# Patient Record
Sex: Male | Born: 1945 | Race: Black or African American | Hispanic: No | Marital: Married | Smoking: Former smoker
Health system: Southern US, Community
[De-identification: ages and names within clinical notes are randomized; demographics above are authoritative.]

## PROBLEM LIST (undated history)

## (undated) DIAGNOSIS — E119 Type 2 diabetes mellitus without complications: Secondary | ICD-10-CM

## (undated) DIAGNOSIS — M1990 Unspecified osteoarthritis, unspecified site: Secondary | ICD-10-CM

## (undated) DIAGNOSIS — G4730 Sleep apnea, unspecified: Secondary | ICD-10-CM

## (undated) DIAGNOSIS — Z85118 Personal history of other malignant neoplasm of bronchus and lung: Secondary | ICD-10-CM

## (undated) DIAGNOSIS — R911 Solitary pulmonary nodule: Secondary | ICD-10-CM

## (undated) DIAGNOSIS — E785 Hyperlipidemia, unspecified: Secondary | ICD-10-CM

## (undated) DIAGNOSIS — J449 Chronic obstructive pulmonary disease, unspecified: Secondary | ICD-10-CM

## (undated) DIAGNOSIS — C801 Malignant (primary) neoplasm, unspecified: Secondary | ICD-10-CM

## (undated) DIAGNOSIS — I10 Essential (primary) hypertension: Secondary | ICD-10-CM

## (undated) DIAGNOSIS — N400 Enlarged prostate without lower urinary tract symptoms: Secondary | ICD-10-CM

## (undated) DIAGNOSIS — R3915 Urgency of urination: Secondary | ICD-10-CM

## (undated) DIAGNOSIS — R350 Frequency of micturition: Secondary | ICD-10-CM

## (undated) HISTORY — DX: Essential (primary) hypertension: I10

## (undated) HISTORY — PX: VIDEO ASSISTED THORACOSCOPY (VATS)/ LOBECTOMY: SHX6169

## (undated) HISTORY — PX: CARPAL TUNNEL RELEASE: SHX101

## (undated) HISTORY — PX: COLONOSCOPY: SHX174

## (undated) HISTORY — PX: POLYPECTOMY: SHX149

## (undated) HISTORY — DX: Hyperlipidemia, unspecified: E78.5

## (undated) HISTORY — DX: Malignant (primary) neoplasm, unspecified: C80.1

---

## 1998-08-20 ENCOUNTER — Emergency Department (HOSPITAL_COMMUNITY): Admission: EM | Admit: 1998-08-20 | Discharge: 1998-08-21 | Payer: Self-pay

## 1998-11-29 ENCOUNTER — Emergency Department (HOSPITAL_COMMUNITY): Admission: EM | Admit: 1998-11-29 | Discharge: 1998-11-29 | Payer: Self-pay | Admitting: Emergency Medicine

## 2000-03-01 ENCOUNTER — Other Ambulatory Visit: Admission: RE | Admit: 2000-03-01 | Discharge: 2000-03-01 | Payer: Self-pay | Admitting: Urology

## 2000-09-03 ENCOUNTER — Other Ambulatory Visit: Admission: RE | Admit: 2000-09-03 | Discharge: 2000-09-03 | Payer: Self-pay | Admitting: Urology

## 2001-03-11 ENCOUNTER — Other Ambulatory Visit: Admission: RE | Admit: 2001-03-11 | Discharge: 2001-03-11 | Payer: Self-pay | Admitting: Urology

## 2002-11-12 ENCOUNTER — Encounter: Admission: RE | Admit: 2002-11-12 | Discharge: 2003-02-10 | Payer: Self-pay | Admitting: Internal Medicine

## 2004-10-31 ENCOUNTER — Ambulatory Visit (HOSPITAL_BASED_OUTPATIENT_CLINIC_OR_DEPARTMENT_OTHER): Admission: RE | Admit: 2004-10-31 | Discharge: 2004-10-31 | Payer: Self-pay | Admitting: Orthopedic Surgery

## 2004-10-31 ENCOUNTER — Ambulatory Visit (HOSPITAL_COMMUNITY): Admission: RE | Admit: 2004-10-31 | Discharge: 2004-10-31 | Payer: Self-pay | Admitting: Orthopedic Surgery

## 2004-10-31 HISTORY — PX: KNEE ARTHROSCOPY W/ MENISCECTOMY: SHX1879

## 2005-08-23 ENCOUNTER — Encounter: Admission: RE | Admit: 2005-08-23 | Discharge: 2005-08-23 | Payer: Self-pay | Admitting: Orthopedic Surgery

## 2006-04-23 ENCOUNTER — Ambulatory Visit: Payer: Self-pay | Admitting: Gastroenterology

## 2006-05-07 ENCOUNTER — Encounter: Payer: Self-pay | Admitting: Gastroenterology

## 2006-05-07 ENCOUNTER — Ambulatory Visit: Payer: Self-pay | Admitting: Gastroenterology

## 2007-06-17 ENCOUNTER — Encounter: Admission: RE | Admit: 2007-06-17 | Discharge: 2007-06-17 | Payer: Self-pay | Admitting: Orthopedic Surgery

## 2007-11-20 ENCOUNTER — Ambulatory Visit (HOSPITAL_COMMUNITY): Admission: RE | Admit: 2007-11-20 | Discharge: 2007-11-20 | Payer: Self-pay | Admitting: Orthopedic Surgery

## 2007-11-20 HISTORY — PX: OTHER SURGICAL HISTORY: SHX169

## 2007-12-19 ENCOUNTER — Encounter: Admission: RE | Admit: 2007-12-19 | Discharge: 2008-01-17 | Payer: Self-pay | Admitting: Orthopedic Surgery

## 2008-12-15 ENCOUNTER — Encounter: Admission: RE | Admit: 2008-12-15 | Discharge: 2008-12-15 | Payer: Self-pay | Admitting: Orthopedic Surgery

## 2009-04-02 ENCOUNTER — Encounter: Payer: Self-pay | Admitting: Cardiology

## 2009-04-03 ENCOUNTER — Inpatient Hospital Stay (HOSPITAL_COMMUNITY): Admission: EM | Admit: 2009-04-03 | Discharge: 2009-04-04 | Payer: Self-pay | Admitting: Emergency Medicine

## 2009-04-03 ENCOUNTER — Ambulatory Visit: Payer: Self-pay | Admitting: Cardiology

## 2009-04-07 ENCOUNTER — Telehealth (INDEPENDENT_AMBULATORY_CARE_PROVIDER_SITE_OTHER): Payer: Self-pay | Admitting: Radiology

## 2009-04-07 DIAGNOSIS — I10 Essential (primary) hypertension: Secondary | ICD-10-CM | POA: Insufficient documentation

## 2009-04-07 DIAGNOSIS — E119 Type 2 diabetes mellitus without complications: Secondary | ICD-10-CM | POA: Insufficient documentation

## 2009-04-07 DIAGNOSIS — E785 Hyperlipidemia, unspecified: Secondary | ICD-10-CM | POA: Insufficient documentation

## 2009-04-08 ENCOUNTER — Ambulatory Visit: Payer: Self-pay

## 2009-04-08 ENCOUNTER — Ambulatory Visit: Payer: Self-pay | Admitting: Cardiology

## 2009-04-08 DIAGNOSIS — R42 Dizziness and giddiness: Secondary | ICD-10-CM | POA: Insufficient documentation

## 2009-04-08 DIAGNOSIS — E669 Obesity, unspecified: Secondary | ICD-10-CM | POA: Insufficient documentation

## 2009-04-08 HISTORY — PX: CARDIOVASCULAR STRESS TEST: SHX262

## 2009-04-15 ENCOUNTER — Ambulatory Visit (HOSPITAL_COMMUNITY): Admission: RE | Admit: 2009-04-15 | Discharge: 2009-04-15 | Payer: Self-pay | Admitting: Internal Medicine

## 2009-04-20 ENCOUNTER — Ambulatory Visit: Payer: Self-pay | Admitting: Thoracic Surgery

## 2009-04-20 ENCOUNTER — Encounter: Admission: RE | Admit: 2009-04-20 | Discharge: 2009-04-20 | Payer: Self-pay | Admitting: Thoracic Surgery

## 2009-04-22 ENCOUNTER — Ambulatory Visit: Admission: RE | Admit: 2009-04-22 | Discharge: 2009-04-22 | Payer: Self-pay | Admitting: Thoracic Surgery

## 2009-04-30 ENCOUNTER — Inpatient Hospital Stay (HOSPITAL_COMMUNITY): Admission: RE | Admit: 2009-04-30 | Discharge: 2009-05-05 | Payer: Self-pay | Admitting: Thoracic Surgery

## 2009-04-30 ENCOUNTER — Ambulatory Visit: Payer: Self-pay | Admitting: Thoracic Surgery

## 2009-04-30 ENCOUNTER — Encounter: Payer: Self-pay | Admitting: Thoracic Surgery

## 2009-05-11 ENCOUNTER — Ambulatory Visit: Payer: Self-pay | Admitting: Thoracic Surgery

## 2009-05-11 ENCOUNTER — Encounter: Admission: RE | Admit: 2009-05-11 | Discharge: 2009-05-11 | Payer: Self-pay | Admitting: Thoracic Surgery

## 2009-05-12 ENCOUNTER — Ambulatory Visit: Payer: Self-pay | Admitting: Internal Medicine

## 2009-06-01 LAB — COMPREHENSIVE METABOLIC PANEL
Albumin: 4.1 g/dL (ref 3.5–5.2)
Alkaline Phosphatase: 66 U/L (ref 39–117)
BUN: 11 mg/dL (ref 6–23)
Calcium: 9.6 mg/dL (ref 8.4–10.5)
Creatinine, Ser: 0.84 mg/dL (ref 0.40–1.50)
Glucose, Bld: 102 mg/dL — ABNORMAL HIGH (ref 70–99)
Potassium: 4.1 mEq/L (ref 3.5–5.3)

## 2009-06-01 LAB — CBC WITH DIFFERENTIAL/PLATELET
Eosinophils Absolute: 0.2 10*3/uL (ref 0.0–0.5)
HCT: 40.1 % (ref 38.4–49.9)
LYMPH%: 22.9 % (ref 14.0–49.0)
MONO#: 0.7 10*3/uL (ref 0.1–0.9)
NEUT#: 5.5 10*3/uL (ref 1.5–6.5)
NEUT%: 66.3 % (ref 39.0–75.0)
Platelets: 237 10*3/uL (ref 140–400)
WBC: 8.2 10*3/uL (ref 4.0–10.3)

## 2009-06-02 ENCOUNTER — Ambulatory Visit: Payer: Self-pay | Admitting: Thoracic Surgery

## 2009-06-02 ENCOUNTER — Encounter: Admission: RE | Admit: 2009-06-02 | Discharge: 2009-06-02 | Payer: Self-pay | Admitting: Thoracic Surgery

## 2009-06-11 ENCOUNTER — Telehealth (INDEPENDENT_AMBULATORY_CARE_PROVIDER_SITE_OTHER): Payer: Self-pay | Admitting: *Deleted

## 2009-06-30 ENCOUNTER — Encounter: Admission: RE | Admit: 2009-06-30 | Discharge: 2009-06-30 | Payer: Self-pay | Admitting: Thoracic Surgery

## 2009-06-30 ENCOUNTER — Ambulatory Visit: Payer: Self-pay | Admitting: Thoracic Surgery

## 2009-09-01 ENCOUNTER — Encounter: Admission: RE | Admit: 2009-09-01 | Discharge: 2009-09-01 | Payer: Self-pay | Admitting: Thoracic Surgery

## 2009-09-01 ENCOUNTER — Ambulatory Visit: Payer: Self-pay | Admitting: Thoracic Surgery

## 2009-11-18 ENCOUNTER — Ambulatory Visit: Payer: Self-pay | Admitting: Internal Medicine

## 2009-11-22 ENCOUNTER — Ambulatory Visit (HOSPITAL_COMMUNITY): Admission: RE | Admit: 2009-11-22 | Discharge: 2009-11-22 | Payer: Self-pay | Admitting: Internal Medicine

## 2009-11-22 LAB — CBC WITH DIFFERENTIAL/PLATELET
BASO%: 0.5 % (ref 0.0–2.0)
Eosinophils Absolute: 0.1 10*3/uL (ref 0.0–0.5)
HCT: 43.8 % (ref 38.4–49.9)
LYMPH%: 25.9 % (ref 14.0–49.0)
MCHC: 32.9 g/dL (ref 32.0–36.0)
MONO#: 0.6 10*3/uL (ref 0.1–0.9)
NEUT%: 65.7 % (ref 39.0–75.0)
Platelets: 250 10*3/uL (ref 140–400)
WBC: 8.7 10*3/uL (ref 4.0–10.3)

## 2009-11-22 LAB — COMPREHENSIVE METABOLIC PANEL
BUN: 11 mg/dL (ref 6–23)
CO2: 23 mEq/L (ref 19–32)
Creatinine, Ser: 0.93 mg/dL (ref 0.40–1.50)
Glucose, Bld: 148 mg/dL — ABNORMAL HIGH (ref 70–99)
Total Bilirubin: 0.8 mg/dL (ref 0.3–1.2)

## 2009-12-08 ENCOUNTER — Ambulatory Visit: Payer: Self-pay | Admitting: Thoracic Surgery

## 2009-12-09 LAB — CBC WITH DIFFERENTIAL/PLATELET
Basophils Absolute: 0 10*3/uL (ref 0.0–0.1)
EOS%: 2.5 % (ref 0.0–7.0)
Eosinophils Absolute: 0.2 10*3/uL (ref 0.0–0.5)
HCT: 41 % (ref 38.4–49.9)
HGB: 13.3 g/dL (ref 13.0–17.1)
LYMPH%: 25.2 % (ref 14.0–49.0)
MCH: 26.3 pg — ABNORMAL LOW (ref 27.2–33.4)
MCV: 80.8 fL (ref 79.3–98.0)
MONO%: 10.2 % (ref 0.0–14.0)
NEUT#: 5.5 10*3/uL (ref 1.5–6.5)
NEUT%: 61.8 % (ref 39.0–75.0)
Platelets: 263 10*3/uL (ref 140–400)

## 2009-12-09 LAB — COMPREHENSIVE METABOLIC PANEL
AST: 18 U/L (ref 0–37)
Albumin: 4 g/dL (ref 3.5–5.2)
Alkaline Phosphatase: 70 U/L (ref 39–117)
BUN: 13 mg/dL (ref 6–23)
Creatinine, Ser: 0.93 mg/dL (ref 0.40–1.50)
Glucose, Bld: 227 mg/dL — ABNORMAL HIGH (ref 70–99)

## 2010-06-01 ENCOUNTER — Ambulatory Visit: Payer: Self-pay | Admitting: Internal Medicine

## 2010-06-03 ENCOUNTER — Ambulatory Visit (HOSPITAL_COMMUNITY): Admission: RE | Admit: 2010-06-03 | Discharge: 2010-06-03 | Payer: Self-pay | Admitting: Internal Medicine

## 2010-06-03 LAB — COMPREHENSIVE METABOLIC PANEL
Albumin: 3.8 g/dL (ref 3.5–5.2)
BUN: 16 mg/dL (ref 6–23)
Calcium: 9.1 mg/dL (ref 8.4–10.5)
Chloride: 109 mEq/L (ref 96–112)
Creatinine, Ser: 1.06 mg/dL (ref 0.40–1.50)
Glucose, Bld: 111 mg/dL — ABNORMAL HIGH (ref 70–99)
Potassium: 4 mEq/L (ref 3.5–5.3)

## 2010-06-03 LAB — CBC WITH DIFFERENTIAL/PLATELET
Basophils Absolute: 0 10*3/uL (ref 0.0–0.1)
EOS%: 0.7 % (ref 0.0–7.0)
Eosinophils Absolute: 0.1 10*3/uL (ref 0.0–0.5)
HCT: 41.8 % (ref 38.4–49.9)
HGB: 13.9 g/dL (ref 13.0–17.1)
MCH: 26.9 pg — ABNORMAL LOW (ref 27.2–33.4)
MCV: 81.2 fL (ref 79.3–98.0)
MONO%: 7 % (ref 0.0–14.0)
NEUT#: 5.9 10*3/uL (ref 1.5–6.5)
NEUT%: 66.7 % (ref 39.0–75.0)
RDW: 15.9 % — ABNORMAL HIGH (ref 11.0–14.6)
lymph#: 2.2 10*3/uL (ref 0.9–3.3)

## 2010-06-08 ENCOUNTER — Ambulatory Visit: Payer: Self-pay | Admitting: Thoracic Surgery

## 2010-06-11 ENCOUNTER — Emergency Department (HOSPITAL_COMMUNITY): Admission: EM | Admit: 2010-06-11 | Discharge: 2010-06-11 | Payer: Self-pay | Admitting: Emergency Medicine

## 2010-08-17 ENCOUNTER — Encounter: Admission: RE | Admit: 2010-08-17 | Discharge: 2010-08-17 | Payer: Self-pay | Admitting: Orthopedic Surgery

## 2010-11-10 ENCOUNTER — Emergency Department (HOSPITAL_COMMUNITY): Admission: EM | Admit: 2010-11-10 | Discharge: 2010-08-04 | Payer: Self-pay | Admitting: Emergency Medicine

## 2010-11-30 ENCOUNTER — Ambulatory Visit: Payer: Self-pay | Admitting: Internal Medicine

## 2010-12-02 ENCOUNTER — Ambulatory Visit (HOSPITAL_COMMUNITY)
Admission: RE | Admit: 2010-12-02 | Discharge: 2010-12-02 | Payer: Self-pay | Source: Home / Self Care | Attending: Internal Medicine | Admitting: Internal Medicine

## 2010-12-02 LAB — CMP (CANCER CENTER ONLY)
AST: 27 U/L (ref 11–38)
Albumin: 3.4 g/dL (ref 3.3–5.5)
BUN, Bld: 15 mg/dL (ref 7–22)
Calcium: 8.6 mg/dL (ref 8.0–10.3)
Chloride: 106 mEq/L (ref 98–108)
Creat: 0.8 mg/dl (ref 0.6–1.2)
Glucose, Bld: 112 mg/dL (ref 73–118)
Potassium: 3.9 mEq/L (ref 3.3–4.7)

## 2010-12-02 LAB — CBC WITH DIFFERENTIAL/PLATELET
Basophils Absolute: 0 10*3/uL (ref 0.0–0.1)
EOS%: 1.3 % (ref 0.0–7.0)
Eosinophils Absolute: 0.1 10*3/uL (ref 0.0–0.5)
HCT: 41.7 % (ref 38.4–49.9)
HGB: 13.6 g/dL (ref 13.0–17.1)
MONO#: 0.5 10*3/uL (ref 0.1–0.9)
NEUT#: 4.8 10*3/uL (ref 1.5–6.5)
NEUT%: 65.6 % (ref 39.0–75.0)
RDW: 15.7 % — ABNORMAL HIGH (ref 11.0–14.6)
lymph#: 1.9 10*3/uL (ref 0.9–3.3)

## 2010-12-06 ENCOUNTER — Ambulatory Visit
Admission: RE | Admit: 2010-12-06 | Discharge: 2010-12-06 | Payer: Self-pay | Source: Home / Self Care | Attending: Thoracic Surgery | Admitting: Thoracic Surgery

## 2010-12-24 ENCOUNTER — Other Ambulatory Visit: Payer: Self-pay | Admitting: Internal Medicine

## 2010-12-24 DIAGNOSIS — C3490 Malignant neoplasm of unspecified part of unspecified bronchus or lung: Secondary | ICD-10-CM

## 2011-03-13 LAB — GLUCOSE, CAPILLARY
Glucose-Capillary: 129 mg/dL — ABNORMAL HIGH (ref 70–99)
Glucose-Capillary: 141 mg/dL — ABNORMAL HIGH (ref 70–99)
Glucose-Capillary: 159 mg/dL — ABNORMAL HIGH (ref 70–99)
Glucose-Capillary: 167 mg/dL — ABNORMAL HIGH (ref 70–99)

## 2011-03-13 LAB — CBC
HCT: 36.8 % — ABNORMAL LOW (ref 39.0–52.0)
Hemoglobin: 12 g/dL — ABNORMAL LOW (ref 13.0–17.0)
RBC: 4.48 MIL/uL (ref 4.22–5.81)
WBC: 12.4 10*3/uL — ABNORMAL HIGH (ref 4.0–10.5)

## 2011-03-14 LAB — COMPREHENSIVE METABOLIC PANEL
ALT: 28 U/L (ref 0–53)
AST: 29 U/L (ref 0–37)
AST: 32 U/L (ref 0–37)
Albumin: 3 g/dL — ABNORMAL LOW (ref 3.5–5.2)
Albumin: 3.5 g/dL (ref 3.5–5.2)
Alkaline Phosphatase: 49 U/L (ref 39–117)
CO2: 27 mEq/L (ref 19–32)
Calcium: 9.4 mg/dL (ref 8.4–10.5)
Chloride: 104 mEq/L (ref 96–112)
Chloride: 108 mEq/L (ref 96–112)
Creatinine, Ser: 0.91 mg/dL (ref 0.4–1.5)
GFR calc Af Amer: >60 mL/min (ref >60)
GFR calc Af Amer: >60 mL/min (ref >60)
GFR calc non Af Amer: >60 mL/min (ref >60)
Potassium: 3.9 mEq/L (ref 3.5–5.1)
Sodium: 137 mEq/L (ref 135–145)
Total Bilirubin: 0.3 mg/dL (ref 0.3–1.2)
Total Bilirubin: 0.5 mg/dL (ref 0.3–1.2)

## 2011-03-14 LAB — ABO/RH: ABO/RH(D): B POS

## 2011-03-14 LAB — GLUCOSE, CAPILLARY
Glucose-Capillary: 122 mg/dL — ABNORMAL HIGH (ref 70–99)
Glucose-Capillary: 129 mg/dL — ABNORMAL HIGH (ref 70–99)
Glucose-Capillary: 140 mg/dL — ABNORMAL HIGH (ref 70–99)
Glucose-Capillary: 145 mg/dL — ABNORMAL HIGH (ref 70–99)
Glucose-Capillary: 151 mg/dL — ABNORMAL HIGH (ref 70–99)
Glucose-Capillary: 152 mg/dL — ABNORMAL HIGH (ref 70–99)
Glucose-Capillary: 178 mg/dL — ABNORMAL HIGH (ref 70–99)
Glucose-Capillary: 181 mg/dL — ABNORMAL HIGH (ref 70–99)
Glucose-Capillary: 199 mg/dL — ABNORMAL HIGH (ref 70–99)
Glucose-Capillary: 205 mg/dL — ABNORMAL HIGH (ref 70–99)
Glucose-Capillary: 109 mg/dL — ABNORMAL HIGH (ref 70–99)
Glucose-Capillary: 113 mg/dL — ABNORMAL HIGH (ref 70–99)
Glucose-Capillary: 123 mg/dL — ABNORMAL HIGH (ref 70–99)
Glucose-Capillary: 131 mg/dL — ABNORMAL HIGH (ref 70–99)

## 2011-03-14 LAB — BASIC METABOLIC PANEL
BUN: 11 mg/dL (ref 6–23)
Calcium: 8.4 mg/dL (ref 8.4–10.5)
Chloride: 102 mEq/L (ref 96–112)
Creatinine, Ser: 0.96 mg/dL (ref 0.4–1.5)
GFR calc Af Amer: >60 mL/min (ref >60)
GFR calc non Af Amer: >60 mL/min (ref >60)
GFR calc non Af Amer: >60 mL/min (ref >60)
Glucose, Bld: 184 mg/dL — ABNORMAL HIGH (ref 70–99)
Potassium: 4.3 mEq/L (ref 3.5–5.1)
Sodium: 137 mEq/L (ref 135–145)
Sodium: 138 mEq/L (ref 135–145)

## 2011-03-14 LAB — HEMOGLOBIN A1C
Hgb A1c MFr Bld: 6.8 % — ABNORMAL HIGH (ref 4.6–6.1)
Mean Plasma Glucose: 148 mg/dL

## 2011-03-14 LAB — POCT CARDIAC MARKERS
CKMB, poc: 2.7 ng/mL (ref 1.0–8.0)
Myoglobin, poc: 127 ng/mL (ref 12–200)

## 2011-03-14 LAB — TYPE AND SCREEN

## 2011-03-14 LAB — TROPONIN I: Troponin I: 0.01 ng/mL (ref 0.00–0.06)

## 2011-03-14 LAB — CK TOTAL AND CKMB (NOT AT ARMC)
CK, MB: 4.4 ng/mL — ABNORMAL HIGH (ref 0.3–4.0)
Relative Index: 0.9 (ref 0.0–2.5)

## 2011-03-14 LAB — APTT: aPTT: 31 seconds (ref 24–37)

## 2011-03-14 LAB — URINE MICROSCOPIC-ADD ON

## 2011-03-14 LAB — URINALYSIS, ROUTINE W REFLEX MICROSCOPIC
Leukocytes, UA: NEGATIVE
Nitrite: NEGATIVE
Specific Gravity, Urine: 1.018 (ref 1.005–1.030)
pH: 6.5 (ref 5.0–8.0)

## 2011-03-14 LAB — CBC
HCT: 36.8 % — ABNORMAL LOW (ref 39.0–52.0)
Hemoglobin: 12.1 g/dL — ABNORMAL LOW (ref 13.0–17.0)
Hemoglobin: 12.8 g/dL — ABNORMAL LOW (ref 13.0–17.0)
MCV: 79.3 fL (ref 78.0–100.0)
MCV: 81.2 fL (ref 78.0–100.0)
MCV: 81.8 fL (ref 78.0–100.0)
Platelets: 211 10*3/uL (ref 150–400)
Platelets: 224 10*3/uL (ref 150–400)
Platelets: 230 10*3/uL (ref 150–400)
RBC: 4.66 MIL/uL (ref 4.22–5.81)
RBC: 4.77 MIL/uL (ref 4.22–5.81)
RDW: 15.3 % (ref 11.5–15.5)
WBC: 12 10*3/uL — ABNORMAL HIGH (ref 4.0–10.5)
WBC: 13.2 10*3/uL — ABNORMAL HIGH (ref 4.0–10.5)
WBC: 7 10*3/uL (ref 4.0–10.5)

## 2011-03-14 LAB — BLOOD GAS, ARTERIAL
Drawn by: 313941
FIO2: 0.21 %
Patient temperature: 98.6
TCO2: 24 mmol/L (ref 0–100)
pH, Arterial: 7.414 (ref 7.350–7.450)

## 2011-03-15 LAB — CBC
Hemoglobin: 13.6 g/dL (ref 13.0–17.0)
MCHC: 32.8 g/dL (ref 30.0–36.0)
MCV: 82.1 fL (ref 78.0–100.0)
RBC: 5.05 MIL/uL (ref 4.22–5.81)
RDW: 15.3 % (ref 11.5–15.5)

## 2011-03-15 LAB — DIFFERENTIAL
Lymphocytes Relative: 20 % (ref 12–46)
Lymphs Abs: 2 10*3/uL (ref 0.7–4.0)
Monocytes Relative: 6 % (ref 3–12)
Neutrophils Relative %: 74 % (ref 43–77)

## 2011-03-15 LAB — COMPREHENSIVE METABOLIC PANEL
CO2: 21 mEq/L (ref 19–32)
Calcium: 8.9 mg/dL (ref 8.4–10.5)
Creatinine, Ser: 1.06 mg/dL (ref 0.4–1.5)
GFR calc Af Amer: >60 mL/min (ref >60)
GFR calc non Af Amer: >60 mL/min (ref >60)
Glucose, Bld: 160 mg/dL — ABNORMAL HIGH (ref 70–99)
Total Protein: 6.6 g/dL (ref 6.0–8.3)

## 2011-03-15 LAB — URINALYSIS, ROUTINE W REFLEX MICROSCOPIC
Ketones, ur: NEGATIVE mg/dL
Leukocytes, UA: NEGATIVE
Nitrite: NEGATIVE
Protein, ur: 30 mg/dL — AB
pH: 5.5 (ref 5.0–8.0)

## 2011-03-15 LAB — POCT CARDIAC MARKERS
CKMB, poc: 5.2 ng/mL (ref 1.0–8.0)
Myoglobin, poc: 175 ng/mL (ref 12–200)
Troponin i, poc: <0.05 ng/mL (ref 0.00–0.09)

## 2011-03-15 LAB — GLUCOSE, CAPILLARY
Glucose-Capillary: 106 mg/dL — ABNORMAL HIGH (ref 70–99)
Glucose-Capillary: 140 mg/dL — ABNORMAL HIGH (ref 70–99)

## 2011-04-18 NOTE — Assessment & Plan Note (Signed)
OFFICE VISIT   Bobby [name], Bobby [name]  DOB:  Nov 07, 1946                                        June 30, 2009  CHART #:  [id number]   The patient came today.  His chest x-ray showed further improvement.  His incisions are well-healed.  His blood pressure is 137/89, pulse 87,  respirations 18, sats were 95%.  He is a stage IA cancer and he saw Dr.  Arbutus Ped.  I plan to see him back again in 4 weeks with a chest x-ray.   Ines Bloomer, M.D.  Electronically Signed   DPB/MEDQ  D:  06/30/2009  T:  06/30/2009  Job:  [id number]

## 2011-04-18 NOTE — Discharge Summary (Signed)
NAME:  Bobby [name], Bobby [name] NO.:  [id number]   MEDICAL RECORD NO.:  [id number]          PATIENT TYPE:  INP   LOCATION:                               FACILITY:  [facility]   PHYSICIAN:  Tera Mater. Evlyn Kanner, M.D. DATE OF BIRTH:  [birth year]   DATE OF ADMISSION:  04/03/2009  DATE OF DISCHARGE:  04/04/2009                               DISCHARGE SUMMARY   Bobby [name] is a 65 year old white male with a history of type 2  diabetes, hypertension, hyperlipidemia who presented for evaluation.  He  had some atypical symptoms and responded positively to nitroglycerin and  was brought in for rule-out MI protocol.  As part of the routine workup,  he was found to have a lung mass which is probably new bronchogenic  carcinoma.  This appears to be in a relatively early stage.  His  hospitalization has been relatively unremarkable.  He has had no  pulmonary symptoms.  He has had no dizziness and he has gotten up to the  bathroom without difficulty.  He is eating well and having no pain in  any manner.  He has been seen in consultation by cardiology who as an  outpatient planned for evaluation.  The patient has been hemodynamically  stable, he has demonstrated no fever, he has had no other complaints.  Blood sugar last evening was 109, this morning's is pending.  Blood  pressure today is 129/57, pulse 75, respirations 19, temperature 98.1,  O2 saturation 98%.   LAB DATA:  An A1c is 6.8%, a troponin was 0.01, before that it was less  than 0.05 and less and 0.05.  Total CK was 486 with an MB of 4.4 with a  relative index of only 0.9.  Yesterday, a myoglobin point of care was  127.  Chemistries yesterday:  Sodium 136, potassium 3.6, chloride 105,  CO2 21, BUN 10, creatinine 1.06, glucose 160.  His calcium level is 8.9,  AST is 38, ALT was 35, total protein 6.6, albumin 3.8.  His white count  was 10,500, hemoglobin 13.6, MCV 82.1, platelets 251,000.  Urinalysis  was positive for small bilirubin and  30 mg percent protein.   RADIOLOGY TESTING:  A CT of the head was done due to the dizziness.  It  showed no acute intracranial abnormality and mild chronic microvascular  white matter disease.  A chest x-ray showed a nodular density in the  right upper lobe.  A CT of the chest was done and was completed showing  a 13-mm spiculated nodule in the lateral right lung apex suspicious for  bronchogenic carcinoma.  A PET CT was recommended.  No pathological  enlargement, adenopathy or pleural effusion was noted and there diffuse  fatty infiltration in the liver.   In summary, we have a 65 year old black male presenting with some  atypical symptoms possibly due to cardiac complaints.  The patient has  done well without further problems and was ruled out by enzymatic and  electrocardiogram criteria for an MI.  He was seen in consultation by  Dr. Antoine Poche, of [facility], who felt like with his  multiple  risk factors and possible upcoming surgery that it would be reasonable  to go ahead and do an outpatient study.  He is going to have exercise  perfusion study to exclude obstructive coronary disease.  Dr. Antoine Poche  is evaluating that.  With regard to his lung mass, a PET CT will be  scheduled as an outpatient and further workup as clinically indicated.  His medications will be to return to Lotrel 5/20 once daily, glimepiride  4 mg once daily, Simcor 500/20 daily, metformin 1000 twice daily, and  hydroxyzine as needed.  No new medications are added, no pain management  is necessary.  He will have a no-concentrated-sweets diet.  He will see  Dr. Sherryll Burger in the next few days to work these problems up.          ______________________________  Tera Mater. Evlyn Kanner, M.D.    SAS/MEDQ  D:  04/04/2009  T:  04/04/2009  Job:  [id number]

## 2011-04-18 NOTE — Discharge Summary (Signed)
NAME:  Bobby [name], Bobby [name] NO.:  [id number]   MEDICAL RECORD NO.:  [id number]          PATIENT TYPE:  INP   LOCATION:  3303                         FACILITY:  [facility]   PHYSICIAN:  Ines Bloomer, M.D. DATE OF BIRTH:  1946-08-22   DATE OF ADMISSION:  04/30/2009  DATE OF DISCHARGE:                               DISCHARGE SUMMARY   FINAL DIAGNOSIS:  Right upper lobe mass, positive adenocarcinoma frozen  segment.  Final pathology pending.   SECONDARY DIAGNOSES:  1. Diabetes mellitus.  2. Status post surgery, right shoulder and right knee.  3. Hypertension.  4. Hyperlipidemia.   IN-HOSPITAL OPERATIONS AND PROCEDURES:  Right video-assisted  thoracoscopic surgery with right upper lobectomy and lymph node  dissection.   HISTORY AND PHYSICAL AND HOSPITAL COURSE:  The patient is a 65 year old  male who was seen in the emergency room complaining of dizziness and  some chest pain.  Cardiolite done by [facility] was negative.  CT scan done that revealed a right upper lobe lesion.  PET scan done  following showed increased uptake with negative adenopathy.  There was a  13-mm lesion in the right upper lobe.  The patient does have a history  of tobacco abuse which he quit in 2001.  He denies fever, chills, or  excessive sputum.  The patient was referred to Dr. Edwyna Shell.  Dr. Edwyna Shell  saw and evaluated the patient.  He discussed with the patient undergoing  right thoracotomy with right upper lobectomy.  He discussed risks and  benefits with the patient.  The patient acknowledged understanding and  agreed to proceed.  Surgery was scheduled for Apr 30, 2009.  For details  of the patient's past medical history and physical exam, please see  dictated H and P.   The patient was taken to the operating room on Apr 30, 2009, where he  underwent right video-assisted thoracoscopic surgery with right mini  thoracotomy and right upper lobectomy with lymph node dissection.  The  patient tolerated this procedure well and transferred to the Intensive  Care Unit in stable condition.  Postoperatively, the patient was able to  be extubated following surgery.  Post-extubation, the patient was noted  to be alert and oriented x4.  Neuro intact.  He was noted to be  hemodynamically stable postoperatively.  During the patient's hospital  course, daily chest x-rays were obtained.  These remained stable.  The  patient did have a small air leak on postop day #1, but resolved by  postop day #2.  One chest tube was discontinued postop day #2.  By  postop day #3, the patient's chest x-ray remained stable.  No air leak  noted.  Remaining chest tube was able to be discontinued.  A followup  chest x-ray following morning remained clear.  During this time, the  patient's vital signs were followed closely.  He remained afebrile.  He  remained in normal sinus rhythm.  Blood pressure was stable.  He was  restarted on blood pressure pills postoperatively.  He was using his  incentive spirometer and able to be weaned off oxygen saturating greater  than 90% on room air.  Postoperatively, the patient was up ambulating  with assistance.  He was tolerating diet well.  No nausea or vomiting  noted.  All incisions were noted to be clean, dry, and intact and  healing well.  Note, the patient's frozen pathology came back showing  positive for adenocarcinoma, but final pathology report is currently  pending.  The patient is noted to be diabetic and blood sugars were  followed closely postoperatively.  He was on sliding scale insulin.  His  metformin was restarted and we will continue to monitor blood sugars.   On May 04, 2009, the patient's vital signs were found to be stable.  His  most recent lab work showed a white blood cell count of 12.4, hemoglobin  of 12.0, hematocrit 36.8, and platelet count 242.  Sodium of 137,  potassium 4.3, chloride 102, bicarbonate 20, BUN of 11, creatinine 0.98,   and glucose of 168.  If the patient remained stable in the next 24-48  hours, he is felt to be ready for discharge to home.   FOLLOWUP APPOINTMENTS:  A followup appointment has been arranged with  Dr. Edwyna Shell for May 11, 2009, at 3:45 p.m..  The patient need to obtain  AP and lateral chest x-ray 45 minutes prior to this appointment.   ACTIVITY:  The patient is instructed no driving until released to do so,  no heavy lifting over 10 pounds.  He is told to ambulate to 3-4 times  per day, progress as tolerated and to continue his breathing exercises.   INCISIONAL CARE:  The patient is told to shower, washing his incisions  using soap and water.  He is to contact the office if he develops any  drainage or opening from any of his incision sites.   DIET:  The patient is educated on diet to be low-fat, low-salt as well  as diabetic diet.   DISCHARGE MEDICATIONS:  1. Metformin 500 mg b.i.d..  2. Amlodipine/benazepril 5/20 mg daily.  3. Simcor 500/20 daily.  4. Hydroxyzine 25 mg p.r.n.  5. Glimepiride 4 mg daily.  6. Darvocet-N 100 one to two tablets q.4-6 h p.r.n. pain.      Sol Blazing, PA      Ines Bloomer, M.D.  Electronically Signed    KMD/MEDQ  D:  05/04/2009  T:  05/05/2009  Job:  [id number]

## 2011-04-18 NOTE — H&P (Signed)
NAME:  Bobby [name], Bobby [name] NO.:  [id number]   MEDICAL RECORD NO.:  [id number]          PATIENT TYPE:  INP   LOCATION:  1401                         FACILITY:  [facility]   PHYSICIAN:  Tera Mater. [place], M.D. DATE OF BIRTH:  Feb 05, 1946   DATE OF ADMISSION:  04/03/2009  DATE OF DISCHARGE:                              HISTORY & PHYSICAL   HISTORY:  Bobby [name] is a 65 year old black male with a history of type  2 diabetes, hypertension and hyperlipidemia, who presented for  evaluation.  He was working hard in the yard yesterday and had an  episode of feeling weak, dizzy and just not feeling well.  He came  inside and thought he was hungry.  He ate a couple of sandwiches and  after about 20 minutes felt better.  He went out and worked further, and  came back in several hours later.  He had no further symptoms of that  type.  He had no prior symptoms similar to this and has been able work  at his regular rate recently.  About 7 p.m., he was talking to someone  on the phone and again had a vertiginous type episode where the room  seemed to spin and he got nausea.  He was sweating.  He came in for  evaluation.  As part of the therapy by the ER staff, he was given  nitroglycerin and his symptoms got dramatically better.  Since then, he  has had no further complaints of this type.  In retrospect, he has not  had anything like this.  He denies any fevers, chills or sweats.  Occasional shortness of breath.  No cough chronically.  His weight has  been stable.  He has had no pressure in his chest, neck, arms or  shoulders.  His vision generally has been good.  He has no neuropathic  complaints.  His bowels have been working well.  He has had no unusual  travels or exposures.  He has had no prior cardiac workup.  As part of  the routine evaluation of a chest x-ray, we have now stumbled upon what  appears to be an early bronchogenic carcinoma that I think is unrelated  to these current  symptoms.  It will be addressed lower down in the  dictation.  At the present time, he is feeling his normal self.  He  notes no other complaints.   PAST MEDICAL HISTORY:  He has had surgery on his right shoulder and his  right knee.  He has had diabetes 5-6 years.  He has no kidney damage to  his knowledge.  He has had no eye laser.  He has had hypertension for  more years than that.  He is not sure exactly how far he has been  treated for lipids.  He has no cardiac history.  He has no cancer  history.   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  He was a smoker up until 2001.  He is married.  He has 2  children 35 and 34, and step-grandchildren.  FAMILY HISTORY:  Mother died at 26 with diabetes and died with a  pulmonary embolism after surgery.  Father died of 82 of renal carcinoma.   MEDICATIONS:  He is on something for cholesterol.  Something for blood  pressure.  He is on metformin 500 twice a day.  They are going to bring  [place] a list.   ALLERGIES:  He does not report any allergies to me here today.   PHYSICAL EXAMINATION:  VITAL SIGNS:  When he first came in, his blood  pressure was up some 150/90, pulse 91, respiratory rate 18, temperature  98.3.  GENERAL:  We have a healthy-appearing black male lying quietly in bed in  no distress.  HEENT:  Sclerae anicteric.  Face generally symmetrical.  Oral mucous  membranes are moist.  NECK:  Supple.  I hear no bruits.  No JVD is present.  No thyromegaly is  present.  LUNGS:  Relatively clear with no wheezes, rales or rhonchi.  I hear no  areas of consolidation.  HEART:  Regular and distant with a soft murmur.  ABDOMEN:  Soft, nontender with no liver edge.  No spleen felt.  EXTREMITIES:  Reveal strong distal pulses with no peripheral edema.  NEURO:  The patient is awake, alert.  His mentation is good.  His speech  is clear.  He is slightly hoarse.  No resting tremor is present.  Sensation is  fairly intact to light touch.  Gait was not tested.   DIAGNOSTICS:  1. CT of the chest without contrast shows a 13 mm spiculated nodule in      the right lateral right lung apex suspicious for bronchogenic      carcinoma.  No other suspicious pulmonary nodule or masses are      noted.  There were no pathologic enlarged mediastinal or hilar      lymph nodes seen.  Diffuse fatty infiltration of the liver was      present.  2. A chest x-ray shows the asymmetric density overlying the fourth      rib.  CT of the chest was recommended and therefore it was      undertaken.  3. A CT of the head showed no acute abnormality and mild chronic      intravascular white matter disease.  This was also done without      contrast.  4. EKG was a sinus rhythm with flat T's in leads II and III.   LABORATORY DATA:  Urinalysis showed small bilirubin, 30 mg percent  protein and no formed elements.  White count is 10,500, hemoglobin 13.6,  platelet 251,000.  First set of cardiac enzymes, his myoglobin point of  care was 175, troponin was less than 0.05 and MB was 5.2.  Repeat at  0141 was troponin less than 0.05, myoglobin point of care 127, CK-MB of  2.7.  Sodium is 136, potassium 3.6, chloride 105, CO2 21, BUN 10,  creatinine 1.06, glucose 160, calcium is 8.9, albumin is 3.8, total  protein 6.6, SGOT is 38 with normal at 37, SGPT is 35.   SUMMARY:  I have a 65 year old black male presenting with some dizzy and  hot episodes that apparently had a clinical response to nitroglycerin  per the ER staff.  This is a very atypical presentation of cardiac  disease, but with his diabetes, it is possible that this is a cardiac  manifestation.  His EKG is nonischemic and his first set of enzymes are  negative.  I think the rule out MI protocol certainly is warranted.  We will get  Cardiology involved, but probably plan to do an outpatient workup of  this.  I do not hear anything that sounds like critical aortic  stenosis  on exam and his carotid upstrokes are good.  He does not seem to have  any peripheral or carotid vascular disease on clinical exam at the  present time.  These symptoms are fairly acute in onset, but do not seem  to represent acute coronary syndrome.  He does need to be on aspirin and  that will be added.  He probably also needs to be on some Lovenox just  while we are doing this workup.  At the present time, he does not have  any preference for a cardiologist and I will make the choice for him.   With regard to his lung mass, this is possibly fortuitous incidental  finding of what appears to be an early bronchogenic carcinoma.  The lack  of pleural hilar or mediastinal adenopathy, or spread to the brain along  with a normal calcium would suggest we found this perhaps in an early  enough stage to make a significant impact on taking care of the problem.  He will need a full workup and a PET CT will be planned.  I am very  hopeful that we can deal with this problem in a direct manner.  Obviously the issue of cardiac disease becomes more important when it  comes to this process.   His diabetes is relatively well controlled and will continue on  metformin with adequate creatinine and no recent contrast with these 2  CTs.   His blood pressure, I am going to make a guess and add some lisinopril.  We will get his  actual list here.  He needs some lipid therapy as well.  Hopefully, I can get him out and do most of this workup as an  outpatient.  All this was discussed in detail with the patient's wife.           ______________________________  Tera Mater. Evlyn Kanner, M.D.     SAS/MEDQ  D:  04/03/2009  T:  04/03/2009  Job:  [id number]

## 2011-04-18 NOTE — Assessment & Plan Note (Signed)
OFFICE VISIT   Bobby [name], Bobby [name]  DOB:  07-16-1946                                        September 01, 2009  CHART #:  [id number]   The patient returns today and he is doing well.  In the chest x-ray, he  has no change.  He is going to have a CT scan in December.  His blood  pressure is 143/83, pulse 76, respirations 18, sats were 96%.  I will  plan to see him back again in 2 months in December after a CT scan.   Ines Bloomer, M.D.  Electronically Signed   DPB/MEDQ  D:  09/01/2009  T:  09/02/2009  Job:  [id number]

## 2011-04-18 NOTE — Letter (Signed)
May 11, 2009   W. Buren Kos, MD  [address]  [place], [place] [address]   Re:  Bobby [name], Bobby [name]             DOB:  [birth year]   Dear Dr. Clelia Croft:   I saw this patient back today after his right upper lobectomy.  He is  doing well overall.  He had adenocarcinoma of the right upper lobe that  was stage I A.  His blood pressure was 142/87, pulse 71, respirations  18, sats were 95%.  Chest x-ray showed improvement in his right side  aeration after his normal postoperative changes.  I told him to  gradually increase his activity, removed his chest tube.  We will refer  him to Dr. Arbutus Ped for an opinion, but probably will just recommend long-  term followup.  I will see him back again in 3 weeks with a chest x-ray.   Ines Bloomer, M.D.  Electronically Signed   DPB/MEDQ  D:  05/11/2009  T:  05/12/2009  Job:  [id number]   cc:   Lajuana Matte, MD

## 2011-04-18 NOTE — Assessment & Plan Note (Signed)
OFFICE VISIT   Bobby [name], Bobby [name]  DOB:  04-20-1946                                        December 06, 2010  CHART #:  [id number]   HISTORY:  The patient is a 65 year old black male with a history of  stage IA non-small cell lung cancer, adenocarcinoma with bronchoalveolar  features diagnosed in May 2010.  He is status post right upper lobectomy  on Apr 30, 2009, by Dr. Edwyna Shell.  He is seen on today's date in routine  followup for discussion of CT scan.  Currently, the patient feels well.  He denies any specific new symptoms.  He has not had any significant  pulmonary symptoms.   PHYSICAL EXAMINATION:  Vital Signs:  Blood pressure 134/85, pulse 83,  respirations 16, oxygen saturation is 96% on room air.  General  Appearance:  Well-developed black male in no acute distress.  Pulmonary:  Examination reveals clear lungs throughout.  Cardiac:  Regular rate and  rhythm.  No murmurs, gallops or rubs.  Abdominal:  Soft, nontender.   LABORATORY DATA:  CT scan was obtained recently and evaluated by Dr.  Edwyna Shell.  Findings are felt to be quite stable.  There is a lesion in the  left lower lobe that is unchanged from previous examination.   ASSESSMENT:  The patient is clinically quite stable.  Dr. Edwyna Shell feels  at this time that there is no specific treatment for this left-sided  lesion other than to continue to follow it with CT scans and he wishes  to repeat that again in 6 months.  He discussed this with the patient.  The patient does have another appointment scheduled to see Dr. Arbutus Ped  apparently later this week.   Rowe Clack, P.A.-C.   Sherryll Burger  D:  12/06/2010  T:  12/07/2010  Job:  [id number]   cc:   Deirdre Peer. Polite, M.D.

## 2011-04-18 NOTE — H&P (Signed)
NAME:  Bobby [name], Bobby [name] NO.:  [id number]   MEDICAL RECORD NO.:  [id number]           PATIENT TYPE:   LOCATION:                                 FACILITY:   PHYSICIAN:  Ines Bloomer, M.D. DATE OF BIRTH:  [birth year]   DATE OF ADMISSION:  DATE OF DISCHARGE:                              HISTORY & PHYSICAL   CHIEF COMPLAINT:  Right upper lobe mass.   This 65 year old patient was seen in the emergency room complaining of  dizziness and some chest pain.  He had a Cardiolite done by [facility], which was negative, but a CT scan revealed a right upper  lobe lesion.  PET scan showed increased uptake with negative adenopathy.  There is a 13-mm lesion in the right upper lobe and the left lobe that  is somewhat anterior laterally.  He quit smoking in 2001.  He has had no  fever, chills, excessive sputum.   PAST MEDICAL HISTORY:  He has no allergies.   MEDICATIONS:  1. Lotrel 5/20 one daily.  2. Glyburide 4 mg daily.  3. Symbicort 500/20 daily.  4. Metformin 1 g twice a day.  5. Hydralazine 40 mg twice a day.   He has hypertension, dyslipidemia, diabetes type 2, and previous history  of tobacco abuse.   FAMILY HISTORY:  Positive for cardiac disease.   SOCIAL HISTORY:  He is married, has 1 child, quit smoking in 2001,  occasional alcohol intake.  There is some fatty infiltration on the CT  scan.   REVIEW OF SYSTEMS:  He is 249 pounds, 5 feet 11 inches.  His weight has  been stable.  CARDIAC:  See history of present illness.  No recent  angina.  PULMONARY:  No hemoptysis.  GI:  No nausea, vomiting, or  constipation.  No GERD.  GU:  No kidney disease, dysuria, or frequent  urination.  VASCULAR:  No claudication, DVT, or TIAs.  NEUROLOGIC:  No  dizziness, headaches, blackouts, or seizures.  MUSCULOSKELETAL:  No  arthritis.  No psychiatric illnesses.  ENT:  No change in his eyesight  or hearing.  HEM/ONC:  No problems with bleeding, clotting disorders, or  anemia.   PHYSICAL EXAMINATION:  GENERAL:  He is a well-developed [place]  male in no acute distress.  VITAL SIGNS:  Blood pressure is 154/84, pulse 88, respirations 18, sats  were 95%.  HEENT:  Head is atraumatic.  Eyes, pupils are equal and reactive to  light and accommodation.  Extraocular movements are normal.  Ears,  tympanic membranes intact.  Nose, there is no septal deviation.  Throat,  uvula is midline with no lesions.  NECK:  Supple without thyromegaly.  There is no supraclavicular or  axillary adenopathy.  CHEST:  Clear to auscultation and percussion.  HEART:  Regular sinus rhythm.  No murmurs.  ABDOMEN:  Soft.  There is no hepatosplenomegaly.  EXTREMITIES:  Pulses are 2+.  There is no clubbing or edema.  NEUROLOGICAL:  He is oriented x3.  Sensory and motor intact.  Cranial  nerves intact.   IMPRESSION:  1. Right upper lobe  PET positive lesion, rule out non-small cell lung      cancer.  2. Diabetes mellitus, type 2.  3. Dyslipidemia.  4. Hypertension.   PLAN:  Right VATS, right upper lobectomy.  Pulmonary function tests were  adequate for resection.      Ines Bloomer, M.D.  Electronically Signed     DPB/MEDQ  D:  04/27/2009  T:  04/28/2009  Job:  [id number]

## 2011-04-18 NOTE — Op Note (Signed)
NAME:  COURTNEY, FENLON NO.:  [id number]   MEDICAL RECORD NO.:  [id number]          PATIENT TYPE:  AMB   LOCATION:  SDS                          FACILITY:  [facility]   PHYSICIAN:  Myrtie Neither, MD      DATE OF BIRTH:  1946/02/04   DATE OF PROCEDURE:  11/20/2007  DATE OF DISCHARGE:  11/20/2007                               OPERATIVE REPORT   PREOP DIAGNOSIS:  Impingement syndrome left shoulder.   POSTOPERATIVE DIAGNOSIS:  Impingement syndrome left shoulder.   ANESTHESIA:  General.   PROCEDURE:  Arthroscopic acromioplasty and decompression and synovectomy  left shoulder.   DESCRIPTION OF PROCEDURE:  The patient was taken to the operating room  after given adequate preop medications, given general anesthesia, and  intubated.  The patient placed in barber chair position, left shoulder  prepped with DuraPrep and draped in a sterile manner.  A 1/2-inch  puncture wound was made posteriorly.  Swisher rod was placed from  posterior-to-anterior and anterior inflow water incision was then made.   Separate lateral incisions were made for the shaver.  Inspection of the  joint revealed hypertrophic subacromial bursal sac with chronic  thickening, overgrowth of the acromion anteriorly, chondromalacia and  eburnation changes on the articular surface of the acromial surface.  Rotator cuff, itself, was found to be intact with no full-thickness  tear.  With the synovial shaver, complete synovectomy was done; as well  as resection of the coracohumeral ligament.   With the use of a bur acromioplasty was done.  After adequate  decompression and acromioplasty, very copious irrigation was done.  Further inspection of the cuff, still did not reveal any through-and-  through tear.  Cuff tendon, itself, basically showed just tendopathy  with overgrowth of the lining and surface.   Wound closure was then done with 4-0 nylon.  The patient had scalenus  block preop, and joint injection was  not necessary.  Compressive  dressing was applied.  An Immobilizing sling was applied.  The patient  tolerated the procedure quite well and went tot the recovery room in  stable and satisfactory condition.  The patient is being discharged home  ice packs, use of immobilizing sling, Percocet 1-2 q.4 h. p.r.n. for  pain, and to return to the office in 1 week.  The patient is being  discharged in stable and satisfactory condition.      Myrtie Neither, MD  Electronically Signed     AC/MEDQ  D:  11/20/2007  T:  11/20/2007  Job:  [id number]

## 2011-04-18 NOTE — Assessment & Plan Note (Signed)
OFFICE VISIT   DELOS, KLICH  DOB:  01/04/1946                                        June 08, 2010  CHART #:  [id number]   The patient came for followup today.  His CT scan was stable.  There is  a left lower lobe lesion that is unchanged, maybe slightly smaller.  He  saw Dr. Arbutus Ped today and Dr. Arbutus Ped scheduled him another CT scan in 6  months, I agree with that interpretation.  His blood pressure was  136/84, pulse 76, respirations 18, and sats were 97%.  He is 1 year  since his surgery.   Ines Bloomer, M.D.  Electronically Signed   DPB/MEDQ  D:  06/08/2010  T:  06/09/2010  Job:  [id number]

## 2011-04-18 NOTE — Letter (Signed)
December 08, 2009   Bobby K. Arbutus Ped, MD  [address]  [place], [place] [address]   Re:  Bobby [name], Bobby [name]             DOB:  11/02/1946   Dear [name],   I saw the patient back today and there is no evidence of recurrence of  his caner, now 6 months after her right upper lobectomy.  He is doing  well overall, and I will plan to see him back again in 6 months with  another CT scan.  We have made him an appointment to see you.  His blood  pressure is 140/90, pulse 100, sats were 96%.   Ines Bloomer, M.D.  Electronically Signed   DPB/MEDQ  D:  12/08/2009  T:  12/09/2009  Job:  [id number]

## 2011-04-18 NOTE — Consult Note (Signed)
NAME:  BRISTOL, SOY NO.:  [id number]   MEDICAL RECORD NO.:  [id number]          PATIENT TYPE:  INP   LOCATION:  1401                         FACILITY:  [facility]   PHYSICIAN:  Rollene Rotunda, MD, FACCDATE OF BIRTH:  1946-07-15   DATE OF CONSULTATION:  04/03/2009  DATE OF DISCHARGE:                                 CONSULTATION   PRIMARY:  Dr. Buren Kos.   CONSULTING:  Dr. Adrian Prince.   REASON FOR CONSULTATION:  Evaluate patient with dizziness and multiple  cardiovascular risk factors.   HISTORY OF PRESENT ILLNESS:  The patient is a pleasant 65 year old  gentleman without prior cardiac history.  He was admitted last evening  after having 2 episodes of lightheadedness and dizziness.  This happened  yesterday.  He apparently had been working in the yard a little bit.  He  felt shaky and lightheaded and slightly nauseated and diaphoretic at  around 2 p.m.  He thought his blood sugar might have dropped.  He came  in and ate a couple of sandwiches and drank a soda and his symptoms  resolved.  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 He said he was up and down three flights of stairs.  He  would fatigued and somewhat dyspneic with this.  However, he did not  have any chest pressure, neck or arm discomfort.  He came home at around  7 p.m. He noticed the room to be spinning.  He did not feel any  palpitations.  He did not lose consciousness.  However, he was very  weak.  He was nauseated and sweating.  He actually had to go down and  crawl.  The symptoms lasted for 3-4 minutes.  He came to the emergency  room where his workup did include negative cardiac enzymes.  His EKG was  nonacute.  He had a head CT which was negative.  However, chest x-ray  demonstrated a possible right upper lobe mass.  He subsequently had a  chest CT which demonstrated a right upper lobe 13-mm spiculated nodule.   He has been monitored overnight and  there have been no arrhythmias.  He  has had no new symptoms since being put in the hospital.  Had no chest  pressure, neck, or arm discomfort.  Apparently he is somewhat active at  home. Though he does not exercise, he does do some yard work.  He cannot  bring on any of these symptoms, although his wife says she thinks he  breathes heavy.  He does not have PND or orthopnea.  He does not  otherwise have chest pressure or neck or arm discomfort.  He has never  had any syncope.  He does not have orthostatic symptoms.   PAST MEDICAL HISTORY:  Diabetes mellitus x6 years, hypertension x5  years, hyperlipidemia x3 years.   PAST SURGICAL HISTORY:  Shoulder surgery, knee surgery, carpal tunnel  surgery.   ALLERGIES:  None.   MEDICATIONS AT HOME:  Amlodipine, benazepril 5/20 daily, glimepiride 4  mg daily, Simcor 500/20 daily, metformin 1000 mg b.i.d., hydroxyzine.  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  He is married.  He has 2 children. He did smoke a pack per day  for 35 years but quit in 2001.   FAMILY HISTORY:  Remarkable for his brother having bypass at age 14.   REVIEW OF SYSTEMS:  As stated in the HPI and otherwise negative for all  other systems.   PHYSICAL EXAMINATION:  GENERAL:  The patient is in no distress.  VITAL SIGNS:  Blood pressure 126/74, heart rate 76 and regular,  afebrile, respiratory rate 20.  HEENT:  Eyelids are unremarkable.  Pupils are equal, round, reactive to  light.  Fundi not visualized. Oral mucosa unremarkable.  NECK:  No jugular distention at 45 degrees. Carotid upstroke brisk and  symmetrical.  No bruits, no thyromegaly.  LYMPHATICS: No cervical, axillary, or inguinal adenopathy.  LUNGS:  Clear to auscultation bilaterally.  BACK:  No costovertebral angle tenderness.  CHEST:  Unremarkable.  HEART:  PMI not displaced or sustained, S1-S2 within normal limits. No  S3, no S4, no clicks, rubs, murmurs.  ABDOMEN:   Obese, positive bowel sounds normal in frequency and pitch. No  bruits, rebound, or guarding. No midline pulsatile mass.  No  hepatomegaly, no splenomegaly.  SKIN:  No rashes.  EXTREMITIES:  With 2+ pulses throughout, no edema, no cyanosis or  clubbing.  NEURO:  Oriented to person, place, and time. Cranial nerves II-XII are  grossly intact. Motor grossly intact.   EKG:  Sinus rhythm, rate 83, leftward axis, intervals within normal  limits, no acute ST-T wave changes.   ASSESSMENT/PLAN:  1. Dizziness.  The patient had episode of dizziness.  This could have      been a vagal reaction or related to hypotension/dehydration from      all the activity he had yesterday.  Most likely this was a      vertiginous type of symptom.  At this point he has had no evidence      of dysrhythmias and no objective evidence of ischemia.  He will be      monitored again overnight.  I think it would be reasonable to      discharge him if there is no recurrence of these symptoms and he is      ambulating without complaints.  2. Preoperative clearance.  The patient will most likely need      resection of his lung nodule.  Dr. Clelia Croft will coordinate this.  He      certainly has very significant cardiovascular risk factors,      including family history, diabetes, hypertension, hyperlipidemia,      and previous tobacco.  He is not overly active although he can do      yard work.  At this point I would like to screen him with an      exercise perfusion study to exclude obstructive coronary disease.      I will arrange to have this done as an outpatient.  3. Hypertension.  Blood pressure is controlled, and he will continue      the meds as listed.  4. Dyslipidemia. Per Dr. Clelia Croft with a goal LDL less than 100 and HDL      greater than 40.  5. Diabetes. His hemoglobin A1c was reasonable.  He will follow with      Dr. Clelia Croft.  He will remain on the meds as listed.   LABS:  Hemoglobin A1c 6.8.  Troponin less than 0.01.  CK-MB was within  normal limits for the MB although the total was slightly elevated, WBC  10.5, hemoglobin 15.6, platelets 251.      Rollene Rotunda, MD, [facility]  Electronically Signed     JH/MEDQ  D:  04/03/2009  T:  04/03/2009  Job:  [id number]   cc:   Kari Baars, M.D.  Fax: [phone]

## 2011-04-18 NOTE — Letter (Signed)
Apr 20, 2009   W. Buren Kos, MD  [address]  [place], [place] [address]   Re:  Bobby [name], Bobby [name]             DOB:  28-Aug-1946   Dear Dr. Clelia Croft:   The patient came to the emergency room, was found dizziness and resulted  in a workup with a Cardiolite by [facility], which was negative,  but he also at that time found that he had a right upper lobe lesion on  chest x-ray and CT scan, PET scan revealed a moderate uptake in the  right upper lobe with negative adenopathy.  He is referred here for  evaluation.  This is a 13-cm spiculated right upper lobe lateral lesion.  He quit smoking in 2001.  He has no fever, chills, or excessive sputum.   PAST MEDICAL HISTORY:  He has no allergies.   MEDICATIONS:  Lotrel 5/20 one daily, glimepiride 4 mg daily, Simcor  500/20 daily, metformin 1 g twice a day, and hydralazine 20 mg twice a  day.   He has hypertension, dyslipidemia, and diabetes mellitus type 2, on  insulin.   FAMILY HISTORY:  Positive for cardiac disease.   SOCIAL HISTORY:  He is married, retired, has 2 children.  Quit smoking  in 2001, but has at least 20-30 pack-year history.  Occasional intake of  alcohol and there is some fatty infiltration on his CT scan.   REVIEW OF SYSTEMS:  He is 249 pounds, he is 5 feet 11 inches.  General:  His weight has been stable.  Cardiac:  No angina or atrial fibrillation.  Pulmonary:  No hemoptysis, fever, chills, or excessive sputum.  GI:  No  nausea or constipation.  No GERD.  GU:  No kidney disease, dysuria, or  frequent urination.  Vascular:  No claudication, DVT, or TIAs.  Neurological:  He has had dizziness.  No headaches, blackouts, or  seizures.  Musculoskeletal:  No arthritis.  No psychiatric illness.  Eyes/ENT:  No changes in eyesight or hearing.  Hematological:  No  problems with bleeding, clotting disorders, or anemia.   PHYSICAL EXAMINATION:  GENERAL:  He is a well-developed [place]  male in no acute  distress.  VITAL SIGNS:  His blood pressure is 156/84, pulse 88, respirations 18,  and saturations were 95%.  HEAD, EYES, EARS, NOSE, AND THROAT:  Unremarkable.  NECK:  Supple without thyromegaly.  There is no supraclavicular or  axillary adenopathy.  CHEST:  Clear to auscultation and percussion.  HEART:  Regular sinus rhythm.  No murmurs.  ABDOMEN:  Soft.  There is no hepatosplenomegaly.  EXTREMITIES:  Pulses are 2+.  There is no clubbing or edema.  NEUROLOGICAL:  He is oriented x3.  Sensory and motor intact.  Cranial  nerves intact.   IMPRESSION:  I feel that he has probably a non-small cell right upper  lobe lesion.  He would benefit from VATS segmentectomy or lobectomy.  We  will plan to do this on Apr 29, 2009, at [facility].  We will get a  full set of pulmonary function tests with DLCO as well as a brain scan  prior to his surgery.  I appreciate the opportunity of seeing the  patient.   Sincerely,   Ines Bloomer, M.D.  Electronically Signed   DPB/MEDQ  D:  04/20/2009  T:  04/21/2009  Job:  [id number]

## 2011-04-18 NOTE — Op Note (Signed)
NAME:  Bobby [name], THUM NO.:  [id number]   MEDICAL RECORD NO.:  [id number]          PATIENT TYPE:  INP   LOCATION:  2312                         FACILITY:  [facility]   PHYSICIAN:  Ines Bloomer, M.D. DATE OF BIRTH:  1946/02/20   DATE OF PROCEDURE:  DATE OF DISCHARGE:                               OPERATIVE REPORT   PREOPERATIVE DIAGNOSIS:  Right upper lobe mass.   POSTOPERATIVE DIAGNOSIS:  Adenocarcinoma, right upper lobe.   OPERATION PERFORMED:  Right video-assisted thoracic surgery, lobectomy,  and node dissection.   SURGEON:  Ines Bloomer, MD   FIRST ASSISTANT:  1. Coral Ceo, PA  2. Rowe Clack, PA-C   ANESTHESIA:  General.   After percutaneous insertion of all monitoring lines, the patient was  turned to the right lateral thoracotomy position and was prepped and  draped in the usual sterile manner.  Two trocar sites were made in the  anterior and posterior axillary lines, seventh intercostal space and  trocar was inserted.  The patient had a very high diaphragm.  Because of  this, it was decided to use an anterior approach and so we made  approximately 6-7 cm incision over the fifth intercostal space at the  anterior axillary line and divided the subcutaneous tissue with  electrocautery and then splitting the serratus and entering the fifth  intercostal space.  The patient was a very large person.  Because of the  size, we could not see adequately, so we placed a small TPA in the  interspace.  Dissection was started anteriorly dissecting out the  superior pulmonary vein.  The staple line divided with the Autosuture 30  gray stapler.  We then stapled out the posterior branch and there were  two 10R lymph nodes around it and these were dissected free and the  branch was then stapled and divided with the Autosuture stapler and  dissecting up superiorly the patient had a very large right middle lobe  branch coming off the pulmonary artery and this  was carefully dissected  free and preserved and then dissecting down more laterally.  On the  pulmonary artery, there was a small anterior branch which was doubly  clipped and divided.  We then divided the minor fissure with the suture  4.5 green staplers with several applications.  The posterior branch was  divided with the Autosuture 30 gray Roticulator stapler.  The superior  portion of the minor fissure was divided with the Autosuture green  stapler and the bronchus was then stapled with the Autosuture 4.5  stapler.  The right upper lobe was removed.  The area was irrigated  copiously and was checked for air leak, none was found.  The inferior  pulmonary ligament was taken down.  The right middle lobe was secured to  the right lower lobe to prevent torsion with a no-knife Ethicon stapler.  Two chest tubes were brought into the  trocar sites.  CoSeal was applied to the staple line.  The chest was  closed with 3 pericostals, #1 Vicryl in the muscle layer, 2-0 Vicryl in  the subcutaneous tissue, and  Dermabond for the skin and a single On-Q  was inserted in the usual fashion.  The patient was returned to the  recovery room in stable condition.      Ines Bloomer, M.D.  Electronically Signed     DPB/MEDQ  D:  04/30/2009  T:  05/01/2009  Job:  [id number]

## 2011-04-18 NOTE — Assessment & Plan Note (Signed)
OFFICE VISIT   Bobby [name], Bobby [name]  DOB:  1946/09/16                                        June 02, 2009  CHART #:  [id number]   [name] blood pressure was 137/88, pulse 92, respirations 18, and  sats were 95%.  His incision is well healed.  His chest x-ray showed  further improvement in his postoperative change.  He is doing well  overall.  I will see him back again in 4 weeks with a chest x-ray.   Ines Bloomer, M.D.  Electronically Signed   DPB/MEDQ  D:  06/02/2009  T:  06/02/2009  Job:  [id number]

## 2011-04-21 NOTE — Op Note (Signed)
NAME:  Bobby [name], Bobby [name] NO.:  [id number]   MEDICAL RECORD NO.:  [id number]          PATIENT TYPE:  AMB   LOCATION:  DSC                          FACILITY:  [facility]   PHYSICIAN:  Robert A. Thurston Hole, M.D. DATE OF BIRTH:  [birth year]   DATE OF PROCEDURE:  10/31/2004  DATE OF DISCHARGE:                                 OPERATIVE REPORT   PREOPERATIVE DIAGNOSIS:  Left knee medial and lateral meniscal tears with  chondromalacia/degenerative joint disease and synovitis.   POSTOPERATIVE DIAGNOSIS:  Left knee medial and lateral meniscal tears with  chondromalacia/degenerative joint disease and synovitis.   PROCEDURE:  Left knee examination under anesthesia, arthroscopic partial  medial and lateral meniscectomies, left knee chondroplasty with partial  synovectomy.   SURGEON:  Elana Alm. Thurston Hole, M.D.   ASSISTANT:  Ivan Anchors, P.A.   ANESTHESIA:  Local and MAC.   OPERATIVE TIME:  30 minutes.   COMPLICATIONS:  None.   INDICATION FOR PROCEDURE:  [name] is a 65 year old gentleman who has  had 6 to 8 months of increasing left knee pain, was examined and MRI  documented a medial meniscus tear with chondromalacia and synovitis, he has  failed conservative care and is now to undergo arthroscopy.   DESCRIPTION OF PROCEDURE:  [name] was brought to the operating room on  10/31/04 after a knee block had been placed in the holding room.  He was  placed on the operating table in the supine position.  His left knee was  examined under anesthesia.  Range of motion 0-125 degrees, 2-3+ crepitation,  knee stable, ligamentous exam normal to patellar tracking.  Left leg was  then prepped using sterile Duraprep and draped using sterile technique.  Originally, through an anterior and lateral portal, the arthroscope with a  pump attached was placed and through an anteromedial portal an arthroscope  probe was placed.  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  The medial  meniscus showed tearing of the posterior and medial horns of which 50-60%  was resected back to a stable rim.  The intercondylar notch was inspected  and the anterior and posterior cruciate ligaments were normal.  The lateral  compartment was inspected and the articular cartilage had 30-40% grade 3  chondromalacia which was debrided.  The lateral meniscus tear of 30-40% in  the posterolateral horn was resected back to a stable rim.  The  patellofemoral joint showed 25-30% grade 4 and the rest grade 3  chondromalacia which was debrided.  The patella tracked normally.  Moderate  synovitis of the medial and lateral gutters was debrided, otherwise they  were free from pathology.  After this was done, it was felt that all  pathology had been satisfactorily addressed.  The instruments were removed.  The portals were closed with 3-0 nylon suture and injected with 0.25%  Marcaine with epinephrine and 4 mg of morphine.  A sterile dressing was  applied and the patient awakened and taken to the recovery room in stable  condition.   FOLLOW-UP CARE:  [name] will be followed as an outpatient on Vicodin  and Naprosyn.  He will be seen back in the office in a week for sutures out  and followup.       RAW/MEDQ  D:  10/31/2004  T:  10/31/2004  Job:  [id number]

## 2011-05-29 ENCOUNTER — Ambulatory Visit (AMBULATORY_SURGERY_CENTER): Payer: Medicare Other | Admitting: *Deleted

## 2011-05-29 VITALS — Ht 71.0 in | Wt 220.1 lb

## 2011-05-29 DIAGNOSIS — Z86010 Personal history of colonic polyps: Secondary | ICD-10-CM

## 2011-05-29 MED ORDER — PEG-KCL-NACL-NASULF-NA ASC-C 100 G PO SOLR: ORAL | Status: DC

## 2011-06-09 ENCOUNTER — Other Ambulatory Visit: Payer: Self-pay | Admitting: Internal Medicine

## 2011-06-09 ENCOUNTER — Encounter (HOSPITAL_BASED_OUTPATIENT_CLINIC_OR_DEPARTMENT_OTHER): Payer: Medicare Other | Admitting: Internal Medicine

## 2011-06-09 ENCOUNTER — Ambulatory Visit (HOSPITAL_COMMUNITY)
Admission: RE | Admit: 2011-06-09 | Discharge: 2011-06-09 | Disposition: A | Discharge status: Home / Self Care | Payer: Medicare Other | Source: Ambulatory Visit | Attending: Internal Medicine | Admitting: Internal Medicine

## 2011-06-09 DIAGNOSIS — C3490 Malignant neoplasm of unspecified part of unspecified bronchus or lung: Secondary | ICD-10-CM | POA: Insufficient documentation

## 2011-06-09 DIAGNOSIS — Z902 Acquired absence of lung [part of]: Secondary | ICD-10-CM | POA: Insufficient documentation

## 2011-06-09 DIAGNOSIS — J984 Other disorders of lung: Secondary | ICD-10-CM | POA: Insufficient documentation

## 2011-06-09 DIAGNOSIS — C3410 Malignant neoplasm of upper lobe, unspecified bronchus or lung: Secondary | ICD-10-CM

## 2011-06-09 LAB — CBC WITH DIFFERENTIAL/PLATELET
BASO%: 0.3 % (ref 0.0–2.0)
Eosinophils Absolute: 0.1 10*3/uL (ref 0.0–0.5)
LYMPH%: 26 % (ref 14.0–49.0)
MCHC: 33.3 g/dL (ref 32.0–36.0)
MONO#: 0.6 10*3/uL (ref 0.1–0.9)
MONO%: 7.5 % (ref 0.0–14.0)
NEUT#: 4.8 10*3/uL (ref 1.5–6.5)
Platelets: 227 10*3/uL (ref 140–400)
RBC: 5.13 10*6/uL (ref 4.20–5.82)
RDW: 15 % — ABNORMAL HIGH (ref 11.0–14.6)
WBC: 7.4 10*3/uL (ref 4.0–10.3)

## 2011-06-09 LAB — CMP (CANCER CENTER ONLY)
ALT(SGPT): 32 U/L (ref 10–47)
Albumin: 3.5 g/dL (ref 3.3–5.5)
Alkaline Phosphatase: 56 U/L (ref 26–84)
CO2: 26 mEq/L (ref 18–33)
Glucose, Bld: 137 mg/dL — ABNORMAL HIGH (ref 73–118)
Potassium: 4 mEq/L (ref 3.3–4.7)
Sodium: 144 mEq/L (ref 128–145)
Total Bilirubin: 0.6 mg/dl (ref 0.20–1.60)
Total Protein: 7.1 g/dL (ref 6.4–8.1)

## 2011-06-09 MED ORDER — IOHEXOL 300 MG/ML  SOLN
80.0000 mL | Freq: Once | INTRAMUSCULAR | Status: AC | PRN
  Administered 2011-06-09: 80 mL via INTRAVENOUS

## 2011-06-12 ENCOUNTER — Other Ambulatory Visit: Payer: Self-pay | Admitting: Gastroenterology

## 2011-06-13 ENCOUNTER — Ambulatory Visit (INDEPENDENT_AMBULATORY_CARE_PROVIDER_SITE_OTHER): Payer: Medicare Other | Admitting: Thoracic Surgery

## 2011-06-13 ENCOUNTER — Encounter (HOSPITAL_BASED_OUTPATIENT_CLINIC_OR_DEPARTMENT_OTHER): Payer: Medicare Other | Admitting: Internal Medicine

## 2011-06-13 ENCOUNTER — Other Ambulatory Visit: Payer: Self-pay | Admitting: Internal Medicine

## 2011-06-13 DIAGNOSIS — C3410 Malignant neoplasm of upper lobe, unspecified bronchus or lung: Secondary | ICD-10-CM

## 2011-06-13 DIAGNOSIS — C3490 Malignant neoplasm of unspecified part of unspecified bronchus or lung: Secondary | ICD-10-CM

## 2011-06-14 NOTE — Assessment & Plan Note (Signed)
OFFICE VISIT  Bobby [name], Bobby [name] DOB:  03-02-1946                                        June 13, 2011 CHART #:  [id number]  The patient returns today, and his CT scan, two view shows no evidence of recurrence.  He is going to get another CT scan in 6 months.  He is now 2 years since I did a right upper lobectomy on him.  The nodule in the left lower lobe is stable if not maybe slightly smaller.  His blood pressure was 121/78, pulse 66, respirations 16, sats were 96%.  We will see him back again.  I will let you follow him from now on.  We will be happy to see him again if he has any further thoracic problem.  Ines Bloomer, M.D. Electronically Signed  DPB/MEDQ  D:  06/13/2011  T:  06/14/2011  Job:  [id number]  cc:   Lajuana Matte, M.D.

## 2011-06-15 ENCOUNTER — Telehealth: Payer: Self-pay | Admitting: Gastroenterology

## 2011-06-15 NOTE — Telephone Encounter (Signed)
Pt concerned that he has eaten nuts and salad in the last 5 days and he would not be cleaned out properly for his procedure tomorrow. Instructed pt to have clear liquids only for today and follow instructions regarding taking his MoviPrep. Pt verbalized understanding.

## 2011-06-16 ENCOUNTER — Ambulatory Visit (AMBULATORY_SURGERY_CENTER): Payer: Medicare Other | Admitting: Gastroenterology

## 2011-06-16 ENCOUNTER — Encounter: Payer: Self-pay | Admitting: Gastroenterology

## 2011-06-16 VITALS — BP 125/80 | HR 69 | Temp 97.7°F | Resp 18 | Ht 70.0 in | Wt 215.0 lb

## 2011-06-16 DIAGNOSIS — Z860100 Personal history of colon polyps, unspecified: Secondary | ICD-10-CM

## 2011-06-16 DIAGNOSIS — Z1211 Encounter for screening for malignant neoplasm of colon: Secondary | ICD-10-CM

## 2011-06-16 MED ORDER — SODIUM CHLORIDE 0.9 % IV SOLN: 500.0000 mL | INTRAVENOUS | Status: DC

## 2011-06-16 NOTE — Patient Instructions (Signed)
Normal colonoscopy  Recall colonoscopy in 7 years  See green and blue sheets for additional d/c instructions

## 2011-06-19 ENCOUNTER — Telehealth: Payer: Self-pay | Admitting: *Deleted

## 2011-06-19 NOTE — Telephone Encounter (Signed)
Telephone call answered but receiver hung up on this caller.

## 2011-09-11 LAB — URINALYSIS, ROUTINE W REFLEX MICROSCOPIC
Ketones, ur: NEGATIVE
Nitrite: NEGATIVE
Protein, ur: NEGATIVE
Urobilinogen, UA: 0.2

## 2011-09-11 LAB — COMPREHENSIVE METABOLIC PANEL
ALT: 34
Alkaline Phosphatase: 56
BUN: 13
CO2: 24
GFR calc non Af Amer: >60
Glucose, Bld: 114 — ABNORMAL HIGH
Potassium: 4.1
Sodium: 139

## 2011-09-11 LAB — CBC
HCT: 41.3
Hemoglobin: 13.9
MCHC: 33.6
RBC: 5.21

## 2011-12-08 ENCOUNTER — Other Ambulatory Visit: Payer: Self-pay | Admitting: Internal Medicine

## 2011-12-08 ENCOUNTER — Other Ambulatory Visit (HOSPITAL_BASED_OUTPATIENT_CLINIC_OR_DEPARTMENT_OTHER): Payer: Medicare Other | Admitting: Lab

## 2011-12-08 ENCOUNTER — Ambulatory Visit (HOSPITAL_COMMUNITY)
Admission: RE | Admit: 2011-12-08 | Discharge: 2011-12-08 | Disposition: A | Discharge status: Home / Self Care | Payer: Medicare Other | Source: Ambulatory Visit | Attending: Internal Medicine | Admitting: Internal Medicine

## 2011-12-08 DIAGNOSIS — R918 Other nonspecific abnormal finding of lung field: Secondary | ICD-10-CM | POA: Diagnosis not present

## 2011-12-08 DIAGNOSIS — C3410 Malignant neoplasm of upper lobe, unspecified bronchus or lung: Secondary | ICD-10-CM

## 2011-12-08 DIAGNOSIS — C3490 Malignant neoplasm of unspecified part of unspecified bronchus or lung: Secondary | ICD-10-CM | POA: Diagnosis not present

## 2011-12-08 DIAGNOSIS — J984 Other disorders of lung: Secondary | ICD-10-CM | POA: Diagnosis not present

## 2011-12-08 LAB — CBC WITH DIFFERENTIAL/PLATELET
BASO%: 0.5 % (ref 0.0–2.0)
EOS%: 1.5 % (ref 0.0–7.0)
MCH: 26.8 pg — ABNORMAL LOW (ref 27.2–33.4)
MCHC: 32.5 g/dL (ref 32.0–36.0)
NEUT%: 66.5 % (ref 39.0–75.0)
RBC: 5.09 10*6/uL (ref 4.20–5.82)
RDW: 15 % — ABNORMAL HIGH (ref 11.0–14.6)
lymph#: 1.8 10*3/uL (ref 0.9–3.3)

## 2011-12-08 LAB — CMP (CANCER CENTER ONLY)
ALT(SGPT): 26 U/L (ref 10–47)
AST: 21 U/L (ref 11–38)
Calcium: 8.8 mg/dL (ref 8.0–10.3)
Chloride: 106 mEq/L (ref 98–108)
Creat: 0.9 mg/dl (ref 0.6–1.2)
Potassium: 4.1 mEq/L (ref 3.3–4.7)

## 2011-12-08 MED ORDER — IOHEXOL 300 MG/ML  SOLN
100.0000 mL | Freq: Once | INTRAMUSCULAR | Status: AC | PRN
  Administered 2011-12-08: 100 mL via INTRAVENOUS

## 2011-12-28 ENCOUNTER — Telehealth: Payer: Self-pay | Admitting: Internal Medicine

## 2011-12-28 ENCOUNTER — Ambulatory Visit (HOSPITAL_BASED_OUTPATIENT_CLINIC_OR_DEPARTMENT_OTHER): Payer: Medicare Other | Admitting: Internal Medicine

## 2011-12-28 DIAGNOSIS — C3490 Malignant neoplasm of unspecified part of unspecified bronchus or lung: Secondary | ICD-10-CM

## 2011-12-28 NOTE — Progress Notes (Signed)
Bobby [name] OFFICE PROGRESS NOTE  Bobby Apo, MD, MD 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  PRIOR THERAPY:  Status post right upper lobectomy on Apr 30, 2009 under the care of Dr. Edwyna [name].  CURRENT THERAPY:  Observation.  INTERVAL HISTORY: Bobby [name] 66 y.o. male returns to the clinic today for routine six-month followup visit. The patient has no complaints today. He denied having any significant weight loss or night sweats. Has no chest pain or shortness of breath, no cough or hemoptysis. No nausea or vomiting. Has repeat CT scan of the chest performed recently and division is here today for evaluation and discussion of his scan results  MEDICAL HISTORY: Past Medical History  Diagnosis Date  . Hypertension   . Cancer     hx lung CA 2010  . Cataract   . Diabetes mellitus   . Hyperlipidemia     ALLERGIES:   has no known allergies.  MEDICATIONS:  Current Outpatient Prescriptions  Medication Sig Dispense Refill  . aspirin 81 MG tablet Take 81 mg by mouth daily.        . ramipril (ALTACE) 1.25 MG capsule Take 1.25 mg by mouth Daily.      Marland Kitchen SIMCOR 500-20 MG 24 hr tablet Take by mouth At bedtime. Simcor 500-20 mg. Take 1 at bedtime      . sitaGLIPtan-metformin (JANUMET) 50-500 MG per tablet Take 1 tablet by mouth 2 (two) times daily with a meal.        . Thiamine HCl (VITAMIN B-1) 100 MG tablet Take 100 mg by mouth daily.        . hydrOXYzine (ATARAX) 25 MG tablet Take 25 mg by mouth as needed. For itching       Current Facility-Administered Medications  Medication Dose Route Frequency Provider Last Rate Last Dose  . 0.9 %  sodium chloride infusion  500 mL Intravenous Continuous Bobby Meckel, MD        SURGICAL HISTORY:  Past Surgical History  Procedure Date  . Carpal tunnel release     right  .  Rotator cuff repair     left  . Knee arthroscopy     left  . Lung removal, partial     right    REVIEW OF SYSTEMS:  A comprehensive review of systems was negative.   PHYSICAL EXAMINATION: General appearance: alert, cooperative, appears stated age and no distress Resp: clear to auscultation bilaterally Cardio: regular rate and rhythm, S1, S2 normal, no murmur, click, rub or gallop GI: soft, non-tender; bowel sounds normal; no masses,  no organomegaly Extremities: extremities normal, atraumatic, no cyanosis or edema  ECOG PERFORMANCE STATUS: 0 - Asymptomatic  There were no vitals taken for this visit.  LABORATORY DATA: Lab Results  Component Value Date   WBC 7.5 12/08/2011   HGB 13.7 12/08/2011   HCT 42.1 12/08/2011   MCV 82.7 12/08/2011   PLT 224 12/08/2011      Chemistry      Component Value Date/Time   NA 144 12/08/2011 0855   NA 142 06/03/2010 0955   K 4.1 12/08/2011 0855   K 4.0 06/03/2010 0955   CL 106 12/08/2011 0855   CL 109 06/03/2010 0955   CO2 28 12/08/2011 0855   CO2 24 06/03/2010 0955   BUN 19 12/08/2011 0855   BUN 16 06/03/2010  0955   CREATININE 0.9 12/08/2011 0855   CREATININE 1.06 06/03/2010 0955      Component Value Date/Time   CALCIUM 8.8 12/08/2011 0855   CALCIUM 9.1 06/03/2010 0955   ALKPHOS 48 12/08/2011 0855   ALKPHOS 47 06/03/2010 0955   AST 21 12/08/2011 0855   AST 28 06/03/2010 0955   ALT 27 06/03/2010 0955   BILITOT 0.50 12/08/2011 0855   BILITOT 0.6 06/03/2010 0955       RADIOGRAPHIC STUDIES: Ct Chest W Contrast  12/08/2011  *RADIOLOGY REPORT*  Clinical Data: Lung cancer.  CT CHEST WITH CONTRAST  Technique:  Multidetector CT imaging of the chest was performed following the standard protocol during bolus administration of intravenous contrast.  Contrast: OMNIPAQUE IOHEXOL 300 MG/ML IV SOLN  Comparison: Chest CT 06/09/2011  Findings: The chest wall is unremarkable and stable.  Stable surgical changes involving the right thorax with slight herniation of lung between the fifth and  sixth ribs.  No destructive bony lesions are identified.  No spinal canal compromise.  No supraclavicular or axillary lymphadenopathy.  Small scattered stable lymph nodes are noted.  The thyroid gland appears normal.  The heart is normal in size.  No pericardial effusion.  No mediastinal or hilar lymphadenopathy.  The aorta is normal in caliber.  No dissection.  The esophagus is grossly normal.  Examination of the lung parenchyma demonstrates stable surgical scarring changes involving the right lung.  No CT findings to suggest recurrent tumor.  The left lower lobe lesion on image number 31 is unchanged when compared with the prior examination from 2010.  This is likely benign but yearly surveillance is recommended given its appearance.  No new pulmonary nodules.  No acute pulmonary findings.  No pleural effusion.  The upper abdomen is unremarkable.  Mild diffuse fatty infiltration of the liver is again noted.  IMPRESSION:  1.  Stable CT appearance of the chest.  No findings for recurrent tumor or metastatic disease. 2.  Stable 14 mm irregular ground-glass type opacity in the left lower lobe.  Yearly surveillance is recommended.  Original Report Authenticated By: Bobby [name], M.D.    ASSESSMENT: This is a very pleasant 66 years old African American male with a stage IA non-small cell lung cancer status post right lower lobectomy in May of 2010. The patient is currently on observation was no evidence for disease recurrence. I discussed the scan results was Ms. Bobby [name].  PLAN: I recommended for him continuous observation for now with repeat CT scan of the chest and followup in 6 months. It was advised to call me immediately she has any concerning symptoms in the interval.   All questions were answered. The patient knows to call the clinic with any problems, questions or concerns. We can certainly see the patient much sooner if necessary.

## 2011-12-28 NOTE — Telephone Encounter (Signed)
Talked to gave him appt for lab and Ct then see MD next day, pt is aware of appt in July 2013

## 2012-01-08 DIAGNOSIS — E119 Type 2 diabetes mellitus without complications: Secondary | ICD-10-CM | POA: Diagnosis not present

## 2012-04-17 DIAGNOSIS — M5137 Other intervertebral disc degeneration, lumbosacral region: Secondary | ICD-10-CM | POA: Diagnosis not present

## 2012-04-18 DIAGNOSIS — Z85118 Personal history of other malignant neoplasm of bronchus and lung: Secondary | ICD-10-CM | POA: Diagnosis not present

## 2012-04-18 DIAGNOSIS — M5137 Other intervertebral disc degeneration, lumbosacral region: Secondary | ICD-10-CM | POA: Diagnosis not present

## 2012-04-18 DIAGNOSIS — M25559 Pain in unspecified hip: Secondary | ICD-10-CM | POA: Diagnosis not present

## 2012-04-18 DIAGNOSIS — M5030 Other cervical disc degeneration, unspecified cervical region: Secondary | ICD-10-CM | POA: Diagnosis not present

## 2012-04-25 DIAGNOSIS — R6882 Decreased libido: Secondary | ICD-10-CM | POA: Diagnosis not present

## 2012-04-25 DIAGNOSIS — C679 Malignant neoplasm of bladder, unspecified: Secondary | ICD-10-CM | POA: Diagnosis not present

## 2012-04-25 DIAGNOSIS — E291 Testicular hypofunction: Secondary | ICD-10-CM | POA: Diagnosis not present

## 2012-04-25 DIAGNOSIS — N400 Enlarged prostate without lower urinary tract symptoms: Secondary | ICD-10-CM | POA: Diagnosis not present

## 2012-05-01 DIAGNOSIS — M47812 Spondylosis without myelopathy or radiculopathy, cervical region: Secondary | ICD-10-CM | POA: Diagnosis not present

## 2012-05-03 DIAGNOSIS — E291 Testicular hypofunction: Secondary | ICD-10-CM | POA: Diagnosis not present

## 2012-05-27 DIAGNOSIS — M5430 Sciatica, unspecified side: Secondary | ICD-10-CM | POA: Diagnosis not present

## 2012-05-30 DIAGNOSIS — R9389 Abnormal findings on diagnostic imaging of other specified body structures: Secondary | ICD-10-CM | POA: Diagnosis not present

## 2012-05-30 DIAGNOSIS — M5126 Other intervertebral disc displacement, lumbar region: Secondary | ICD-10-CM | POA: Diagnosis not present

## 2012-06-10 ENCOUNTER — Other Ambulatory Visit (HOSPITAL_COMMUNITY): Payer: Self-pay | Admitting: Orthopedic Surgery

## 2012-06-10 DIAGNOSIS — C3490 Malignant neoplasm of unspecified part of unspecified bronchus or lung: Secondary | ICD-10-CM

## 2012-06-11 DIAGNOSIS — Z0000 Encounter for general adult medical examination without abnormal findings: Secondary | ICD-10-CM | POA: Diagnosis not present

## 2012-06-11 DIAGNOSIS — I10 Essential (primary) hypertension: Secondary | ICD-10-CM | POA: Diagnosis not present

## 2012-06-11 DIAGNOSIS — E119 Type 2 diabetes mellitus without complications: Secondary | ICD-10-CM | POA: Diagnosis not present

## 2012-06-11 DIAGNOSIS — E782 Mixed hyperlipidemia: Secondary | ICD-10-CM | POA: Diagnosis not present

## 2012-06-14 ENCOUNTER — Encounter (HOSPITAL_COMMUNITY)
Admission: RE | Admit: 2012-06-14 | Discharge: 2012-06-14 | Disposition: A | Discharge status: Home / Self Care | Payer: Medicare Other | Source: Ambulatory Visit | Attending: Orthopedic Surgery | Admitting: Orthopedic Surgery

## 2012-06-14 DIAGNOSIS — C3490 Malignant neoplasm of unspecified part of unspecified bronchus or lung: Secondary | ICD-10-CM | POA: Diagnosis not present

## 2012-06-14 DIAGNOSIS — M25559 Pain in unspecified hip: Secondary | ICD-10-CM | POA: Diagnosis not present

## 2012-06-14 MED ORDER — TECHNETIUM TC 99M MEDRONATE IV KIT
25.0000 | PACK | Freq: Once | INTRAVENOUS | Status: AC | PRN
  Administered 2012-06-14: 25.8 via INTRAVENOUS

## 2012-06-17 DIAGNOSIS — R937 Abnormal findings on diagnostic imaging of other parts of musculoskeletal system: Secondary | ICD-10-CM | POA: Diagnosis not present

## 2012-06-17 DIAGNOSIS — M5126 Other intervertebral disc displacement, lumbar region: Secondary | ICD-10-CM | POA: Diagnosis not present

## 2012-06-17 DIAGNOSIS — IMO0002 Reserved for concepts with insufficient information to code with codable children: Secondary | ICD-10-CM | POA: Diagnosis not present

## 2012-06-17 DIAGNOSIS — M1710 Unilateral primary osteoarthritis, unspecified knee: Secondary | ICD-10-CM | POA: Diagnosis not present

## 2012-06-21 ENCOUNTER — Other Ambulatory Visit: Payer: Self-pay | Admitting: Orthopedic Surgery

## 2012-06-21 DIAGNOSIS — IMO0002 Reserved for concepts with insufficient information to code with codable children: Secondary | ICD-10-CM

## 2012-06-21 DIAGNOSIS — M5126 Other intervertebral disc displacement, lumbar region: Secondary | ICD-10-CM

## 2012-06-26 ENCOUNTER — Ambulatory Visit (HOSPITAL_COMMUNITY)
Admission: RE | Admit: 2012-06-26 | Discharge: 2012-06-26 | Disposition: A | Discharge status: Home / Self Care | Payer: Medicare Other | Source: Ambulatory Visit | Attending: Internal Medicine | Admitting: Internal Medicine

## 2012-06-26 ENCOUNTER — Other Ambulatory Visit (HOSPITAL_BASED_OUTPATIENT_CLINIC_OR_DEPARTMENT_OTHER): Payer: Medicare Other | Admitting: Lab

## 2012-06-26 DIAGNOSIS — K7689 Other specified diseases of liver: Secondary | ICD-10-CM | POA: Insufficient documentation

## 2012-06-26 DIAGNOSIS — R911 Solitary pulmonary nodule: Secondary | ICD-10-CM | POA: Insufficient documentation

## 2012-06-26 DIAGNOSIS — C3410 Malignant neoplasm of upper lobe, unspecified bronchus or lung: Secondary | ICD-10-CM | POA: Diagnosis not present

## 2012-06-26 DIAGNOSIS — C3490 Malignant neoplasm of unspecified part of unspecified bronchus or lung: Secondary | ICD-10-CM | POA: Diagnosis not present

## 2012-06-26 DIAGNOSIS — J984 Other disorders of lung: Secondary | ICD-10-CM | POA: Diagnosis not present

## 2012-06-26 LAB — CBC WITH DIFFERENTIAL/PLATELET
BASO%: 0.2 % (ref 0.0–2.0)
EOS%: 0.2 % (ref 0.0–7.0)
HCT: 39.8 % (ref 38.4–49.9)
HGB: 13 g/dL (ref 13.0–17.1)
MCHC: 32.8 g/dL (ref 32.0–36.0)
MONO#: 0.7 10*3/uL (ref 0.1–0.9)
NEUT%: 77.9 % — ABNORMAL HIGH (ref 39.0–75.0)
RDW: 16 % — ABNORMAL HIGH (ref 11.0–14.6)
WBC: 11.5 10*3/uL — ABNORMAL HIGH (ref 4.0–10.3)
lymph#: 1.8 10*3/uL (ref 0.9–3.3)

## 2012-06-26 LAB — CMP (CANCER CENTER ONLY)
ALT(SGPT): 33 U/L (ref 10–47)
AST: 20 U/L (ref 11–38)
Albumin: 3.2 g/dL — ABNORMAL LOW (ref 3.3–5.5)
CO2: 27 mEq/L (ref 18–33)
Calcium: 9 mg/dL (ref 8.0–10.3)
Chloride: 102 mEq/L (ref 98–108)
Creat: 1.2 mg/dl (ref 0.6–1.2)
Potassium: 3.9 mEq/L (ref 3.3–4.7)
Total Protein: 6.5 g/dL (ref 6.4–8.1)

## 2012-06-26 MED ORDER — IOHEXOL 300 MG/ML  SOLN
80.0000 mL | Freq: Once | INTRAMUSCULAR | Status: AC | PRN
  Administered 2012-06-26: 80 mL via INTRAVENOUS

## 2012-06-27 ENCOUNTER — Encounter: Payer: Self-pay | Admitting: *Deleted

## 2012-06-27 ENCOUNTER — Telehealth: Payer: Self-pay | Admitting: Internal Medicine

## 2012-06-27 ENCOUNTER — Ambulatory Visit (HOSPITAL_BASED_OUTPATIENT_CLINIC_OR_DEPARTMENT_OTHER): Payer: Medicare Other | Admitting: Internal Medicine

## 2012-06-27 VITALS — BP 123/76 | HR 79 | Temp 97.6°F | Ht 70.0 in | Wt 229.8 lb

## 2012-06-27 DIAGNOSIS — C3490 Malignant neoplasm of unspecified part of unspecified bronchus or lung: Secondary | ICD-10-CM | POA: Diagnosis not present

## 2012-06-27 DIAGNOSIS — C3411 Malignant neoplasm of upper lobe, right bronchus or lung: Secondary | ICD-10-CM | POA: Insufficient documentation

## 2012-06-27 NOTE — Telephone Encounter (Signed)
gv pt appt schedule for August and pet scan appt for 7/30.

## 2012-06-27 NOTE — Progress Notes (Signed)
[facility] Telephone:([phone]) [phone]   Fax:([phone]) [phone]  OFFICE PROGRESS NOTE  [name], MD 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   PRIOR THERAPY: Status post right upper lobectomy on Apr 30, 2009 under the care of Dr. Edwyna Shell.   CURRENT THERAPY: Observation.  INTERVAL HISTORY: RAMIR MALERBA 66 y.o. male returns to the clinic today for routine six-month followup visit. The patient has no complaints today. He denied having any significant chest pain or shortness breath, no cough or hemoptysis. He gained few pounds since his last visit. The patient has repeat CT scan of the chest performed recently and he is here for evaluation and discussion of his scan results.  MEDICAL HISTORY: Past Medical History  Diagnosis Date  . Hypertension   . Cancer     hx lung CA 2010  . Cataract   . Diabetes mellitus   . Hyperlipidemia     ALLERGIES:   has no known allergies.  MEDICATIONS:  Current Outpatient Prescriptions  Medication Sig Dispense Refill  . aspirin 81 MG tablet Take 81 mg by mouth daily.        . hydrOXYzine (ATARAX) 25 MG tablet Take 25 mg by mouth as needed. For itching      . ramipril (ALTACE) 1.25 MG capsule Take 1.25 mg by mouth Daily.      Marland Kitchen SIMCOR 500-20 MG 24 hr tablet Take by mouth At bedtime. Simcor 500-20 mg. Take 1 at bedtime      . sitaGLIPtan-metformin (JANUMET) 50-500 MG per tablet Take 1 tablet by mouth 2 (two) times daily with a meal.        . Thiamine HCl (VITAMIN B-1) 100 MG tablet Take 100 mg by mouth daily.        Marland Kitchen amLODipine-benazepril (LOTREL) 5-20 MG per capsule Take 5 mg by mouth Daily.      Marland Kitchen oxyCODONE-acetaminophen (PERCOCET/ROXICET) 5-325 MG per tablet Take 5 mg by mouth Every 6 hours as needed. Spinal stenosis       Current Facility-Administered Medications  Medication  Dose Route Frequency Provider Last Rate Last Dose  . 0.9 %  sodium chloride infusion  500 mL Intravenous Continuous Louis Meckel, MD       Facility-Administered Medications Ordered in Other Visits  Medication Dose Route Frequency Provider Last Rate Last Dose  . iohexol (OMNIPAQUE) 300 MG/ML solution 80 mL  80 mL Intravenous Once PRN Medication Radiologist, MD   80 mL at 06/26/12 1025    SURGICAL HISTORY:  Past Surgical History  Procedure Date  . Carpal tunnel release     right  . Rotator cuff repair     left  . Knee arthroscopy     left  . Lung removal, partial     right    REVIEW OF SYSTEMS:  A comprehensive review of systems was negative.   PHYSICAL EXAMINATION: General appearance: alert, cooperative and no distress Head: Normocephalic, without obvious abnormality, atraumatic Neck: no adenopathy Lymph nodes: Cervical, supraclavicular, and axillary nodes normal. Resp: clear to auscultation bilaterally Cardio: regular rate and rhythm, S1, S2 normal, no murmur, click, rub or gallop GI: soft, non-tender; bowel sounds normal; no masses,  no organomegaly Extremities: extremities normal, atraumatic, no cyanosis or edema Neurologic: Alert and oriented X 3, normal strength and tone. Normal symmetric reflexes. Normal coordination and gait  ECOG PERFORMANCE STATUS: 0 - Asymptomatic  Blood pressure 123/76, pulse 79, temperature 97.6 F (36.4 C), temperature source Oral, height 5\' 10"  (1.778 m), weight 229 lb 12.8 oz (104.237 kg).  LABORATORY DATA: Lab Results  Component Value Date   WBC 11.5* 06/26/2012   HGB 13.0 06/26/2012   HCT 39.8 06/26/2012   MCV 82.9 06/26/2012   PLT 218 06/26/2012      Chemistry      Component Value Date/Time   NA 137 06/26/2012 0941   NA 142 06/03/2010 0955   K 3.9 06/26/2012 0941   K 4.0 06/03/2010 0955   CL 102 06/26/2012 0941   CL 109 06/03/2010 0955   CO2 27 06/26/2012 0941   CO2 24 06/03/2010 0955   BUN 16 06/26/2012 0941   BUN 16 06/03/2010 0955    CREATININE 1.2 06/26/2012 0941   CREATININE 1.06 06/03/2010 0955      Component Value Date/Time   CALCIUM 9.0 06/26/2012 0941   CALCIUM 9.1 06/03/2010 0955   ALKPHOS 43 06/26/2012 0941   ALKPHOS 47 06/03/2010 0955   AST 20 06/26/2012 0941   AST 28 06/03/2010 0955   ALT 27 06/03/2010 0955   BILITOT 0.60 06/26/2012 0941   BILITOT 0.6 06/03/2010 0955       RADIOGRAPHIC STUDIES: Ct Chest W Contrast  06/26/2012  *RADIOLOGY REPORT*  Clinical Data: Follow-up lung cancer.  CT CHEST WITH CONTRAST  Technique:  Multidetector CT imaging of the chest was performed following the standard protocol during bolus administration of intravenous contrast.  Contrast: 80mL OMNIPAQUE IOHEXOL 300 MG/ML  SOLN  Comparison: Chest CT 12/08/2011 and 06/09/2011.  Findings: The chest wall is stable.  No supraclavicular or axillary mass or lymphadenopathy.  The thyroid gland is normal.  The bony thorax is intact.  No destructive bone lesions or spinal canal compromise.  Stable surgical changes related to a right thoracotomy.  The heart is normal in size.  No pericardial effusion.  No mediastinal or hilar lymphadenopathy.  The aorta is normal in caliber.  No dissection.  The esophagus is grossly normal.  Stable surgical changes from a right upper lobe lobectomy.  Stable surgical changes involving the right lung with persistent right basilar scarring changes.  No findings suspicious for recurrent tumor.  No worrisome right lung nodules.  No pleural effusion.  In the superior segment of the left lower lobe there is a 16 x 11.5 mm lesion.  This has changed since the prior examinations and appears more solid and has enlarged.  This is worrisome for a slow growing adenocarcinoma (BAC). Recommend biopsy.  The upper abdomen is unremarkable.  Mild fatty infiltration of the liver is noted.  IMPRESSION:  1.  Interval change in size and appearance of the superior segment left lower lobe lung nodule suspicious for  BAC.  Recommend biopsy. 2.  Stable surgical  changes involving the right hemithorax without findings for recurrent tumor. 3.  No mediastinal or hilar lymphadenopathy.  Original Report Authenticated By: P. Loralie Champagne, M.D.   Nm Bone Scan Whole Body  06/14/2012  *RADIOLOGY REPORT*  Clinical Data: Lung cancer, left hip pain, concern for metastatic bone disease.  NUCLEAR MEDICINE WHOLE BODY BONE SCINTIGRAPHY  Technique:  Whole body anterior and posterior images were obtained approximately 3 hours after intravenous injection of radiopharmaceutical.  Radiopharmaceutical: 25.8MILLI CURIE TC-MDP TECHNETIUM TC 58M MEDRONATE IV KIT  Comparison: PET CT scan 04/15/2009 CT chest 12/08/2011  Findings: There there is no focal abnormal uptake within the axillary  or appendicular skeleton to suggest bone metastasis. Particular attention directed towards the left hip.  There is a large volume of retained radiotracer within the bladder which obscures the sacrum.  There is a tiny focus of uptake injecting over the right distal femoral metadiaphysis on the anterior projection.  IMPRESSION:  1. No convincing evidence of metastasis. 2.  No abnormal uptake within the left hip. 3.  A single focus of uptake within the distal right femur.  While this could represent contamination, consider plain films, MRI,  or follow up bone scan (2 - 3 months) to  reevaluate.  Original Report Authenticated By: Genevive Bi, M.D.    ASSESSMENT: This is a very pleasant 66 years old [place] American male with history of stage IA non-small cell lung cancer status post right upper lobectomy and has been observation since may of 2010. Unfortunately the patient has a suspicious left lower lobe nodule that increased in size and density since the previous scan.  PLAN: I discussed the scan results and showed the images to the patient. I recommended for him to have a PET scan for further evaluation of this nodule. If suspicious on the PET scan would consider the patient for either surgical  resection or CT-guided biopsy followed by stereotactic radiotherapy. He would come back for followup visit in 2 weeks for evaluation and discussion of his scan results.  All questions were answered. The patient knows to call the clinic with any problems, questions or concerns. We can certainly see the patient much sooner if necessary.

## 2012-06-27 NOTE — Progress Notes (Signed)
Spoke with pt at [facility] today.  Questions and concerns addressed.  Survey for survivorship completed

## 2012-06-28 ENCOUNTER — Ambulatory Visit
Admission: RE | Admit: 2012-06-28 | Discharge: 2012-06-28 | Disposition: A | Discharge status: Home / Self Care | Payer: Medicare Other | Source: Ambulatory Visit | Attending: Orthopedic Surgery | Admitting: Orthopedic Surgery

## 2012-06-28 DIAGNOSIS — IMO0002 Reserved for concepts with insufficient information to code with codable children: Secondary | ICD-10-CM

## 2012-06-28 DIAGNOSIS — M47817 Spondylosis without myelopathy or radiculopathy, lumbosacral region: Secondary | ICD-10-CM | POA: Diagnosis not present

## 2012-06-28 DIAGNOSIS — M5126 Other intervertebral disc displacement, lumbar region: Secondary | ICD-10-CM

## 2012-06-28 MED ORDER — IOHEXOL 180 MG/ML  SOLN
1.0000 mL | Freq: Once | INTRAMUSCULAR | Status: AC | PRN
  Administered 2012-06-28: 1 mL via EPIDURAL

## 2012-06-28 MED ORDER — METHYLPREDNISOLONE ACETATE 40 MG/ML INJ SUSP (RADIOLOG
120.0000 mg | Freq: Once | INTRAMUSCULAR | Status: AC
  Administered 2012-06-28: 120 mg via EPIDURAL

## 2012-07-02 ENCOUNTER — Encounter (HOSPITAL_COMMUNITY)
Admission: RE | Admit: 2012-07-02 | Discharge: 2012-07-02 | Disposition: A | Discharge status: Home / Self Care | Payer: Medicare Other | Source: Ambulatory Visit | Attending: Internal Medicine | Admitting: Internal Medicine

## 2012-07-02 DIAGNOSIS — R918 Other nonspecific abnormal finding of lung field: Secondary | ICD-10-CM | POA: Insufficient documentation

## 2012-07-02 DIAGNOSIS — C3490 Malignant neoplasm of unspecified part of unspecified bronchus or lung: Secondary | ICD-10-CM | POA: Diagnosis not present

## 2012-07-02 LAB — GLUCOSE, CAPILLARY: Glucose-Capillary: 111 mg/dL — ABNORMAL HIGH (ref 70–99)

## 2012-07-02 MED ORDER — FLUDEOXYGLUCOSE F - 18 (FDG) INJECTION
18.7000 | Freq: Once | INTRAVENOUS | Status: AC | PRN
  Administered 2012-07-02: 18.7 via INTRAVENOUS

## 2012-07-10 ENCOUNTER — Ambulatory Visit (HOSPITAL_BASED_OUTPATIENT_CLINIC_OR_DEPARTMENT_OTHER): Payer: Medicare Other | Admitting: Internal Medicine

## 2012-07-10 ENCOUNTER — Telehealth: Payer: Self-pay | Admitting: Internal Medicine

## 2012-07-10 VITALS — BP 148/78 | HR 105 | Temp 99.4°F | Resp 20 | Ht 70.0 in | Wt 230.7 lb

## 2012-07-10 DIAGNOSIS — C3430 Malignant neoplasm of lower lobe, unspecified bronchus or lung: Secondary | ICD-10-CM

## 2012-07-10 DIAGNOSIS — C3490 Malignant neoplasm of unspecified part of unspecified bronchus or lung: Secondary | ICD-10-CM

## 2012-07-10 NOTE — Telephone Encounter (Signed)
appts made and printed for pt aom °

## 2012-07-10 NOTE — Progress Notes (Signed)
[facility] [phone]   [phone]  OFFICE PROGRESS NOTE  [name], MD 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   PRIOR THERAPY: Status post right upper lobectomy on Apr 30, 2009 under the care of Dr. Edwyna Shell.   CURRENT THERAPY: Observation.  INTERVAL HISTORY: Bobby [name] 66 y.o. male returns to the clinic today for followup visit accompanied by his wife and brother. Seen 10 days ago for routine followup visit and was found on CT scan of the chest to have suspicious nodule in the superior segment of the left lower lobe, concerning for bronchoalveolar carcinoma. I ordered a PET scan which was performed recently and the patient is here today for evaluation and discussion of his scan results. The patient is feeling fine today with no specific complaints. He denied having any significant weight loss or night sweats. He denied having any significant chest pain or shortness breath, no cough or hemoptysis.  MEDICAL HISTORY: Past Medical History  Diagnosis Date  . Hypertension   . Cancer     hx lung CA 2010  . Cataract   . Diabetes mellitus   . Hyperlipidemia     ALLERGIES:   has no known allergies.  MEDICATIONS:  Current Outpatient Prescriptions  Medication Sig Dispense Refill  . amLODipine-benazepril (LOTREL) 5-20 MG per capsule Take 5 mg by mouth Daily.      Marland Kitchen aspirin 81 MG tablet Take 81 mg by mouth daily.        . hydrOXYzine (ATARAX) 25 MG tablet Take 25 mg by mouth as needed. For itching      . ramipril (ALTACE) 1.25 MG capsule Take 1.25 mg by mouth Daily.      Marland Kitchen SIMCOR 500-20 MG 24 hr tablet Take by mouth At bedtime. Simcor 500-20 mg. Take 1 at bedtime      . sitaGLIPtan-metformin (JANUMET) 50-500 MG per tablet Take 1 tablet by mouth 2 (two) times daily with a meal.        .  Thiamine HCl (VITAMIN B-1) 100 MG tablet Take 100 mg by mouth daily.        Marland Kitchen oxyCODONE-acetaminophen (PERCOCET/ROXICET) 5-325 MG per tablet Take 5 mg by mouth Every 6 hours as needed. Spinal stenosis       Current Facility-Administered Medications  Medication Dose Route Frequency Provider Last Rate Last Dose  . 0.9 %  sodium chloride infusion  500 mL Intravenous Continuous Louis Meckel, MD        SURGICAL HISTORY:  Past Surgical History  Procedure Date  . Carpal tunnel release     right  . Rotator cuff repair     left  . Knee arthroscopy     left  . Lung removal, partial     right    REVIEW OF SYSTEMS:  A comprehensive review of systems was negative.   PHYSICAL EXAMINATION: General appearance: alert, cooperative and no distress Head: Normocephalic, without obvious abnormality, atraumatic Neck: no adenopathy Lymph nodes: Cervical, supraclavicular, and axillary nodes normal. Resp: clear to auscultation bilaterally Cardio: regular rate and rhythm, S1, S2 normal, no murmur, click, rub or gallop GI: soft, non-tender; bowel sounds normal; no masses,  no organomegaly Extremities: extremities normal, atraumatic, no cyanosis or edema Neurologic: Alert and oriented X 3, normal strength and tone. Normal symmetric reflexes. Normal coordination and gait  ECOG PERFORMANCE STATUS: 0 - Asymptomatic  Blood pressure 148/78, pulse 105, temperature 99.4 F (37.4 C), temperature source Oral, resp. rate 20, height 5\' 10"  (1.778 m), weight 230 lb 11.2 oz (104.645 kg).  LABORATORY DATA: Lab Results  Component Value Date   WBC 11.5* 06/26/2012   HGB 13.0 06/26/2012   HCT 39.8 06/26/2012   MCV 82.9 06/26/2012   PLT 218 06/26/2012      Chemistry      Component Value Date/Time   NA 137 06/26/2012 0941   NA 142 06/03/2010 0955   K 3.9 06/26/2012 0941   K 4.0 06/03/2010 0955   CL 102 06/26/2012 0941   CL 109 06/03/2010 0955   CO2 27 06/26/2012 0941   CO2 24 06/03/2010 0955   BUN 16 06/26/2012 0941    BUN 16 06/03/2010 0955   CREATININE 1.2 06/26/2012 0941   CREATININE 1.06 06/03/2010 0955      Component Value Date/Time   CALCIUM 9.0 06/26/2012 0941   CALCIUM 9.1 06/03/2010 0955   ALKPHOS 43 06/26/2012 0941   ALKPHOS 47 06/03/2010 0955   AST 20 06/26/2012 0941   AST 28 06/03/2010 0955   ALT 27 06/03/2010 0955   BILITOT 0.60 06/26/2012 0941   BILITOT 0.6 06/03/2010 0955       RADIOGRAPHIC STUDIES: Ct Chest W Contrast  06/26/2012  *RADIOLOGY REPORT*  Clinical Data: Follow-up lung cancer.  CT CHEST WITH CONTRAST  Technique:  Multidetector CT imaging of the chest was performed following the standard protocol during bolus administration of intravenous contrast.  Contrast: 80mL OMNIPAQUE IOHEXOL 300 MG/ML  SOLN  Comparison: Chest CT 12/08/2011 and 06/09/2011.  Findings: The chest wall is stable.  No supraclavicular or axillary mass or lymphadenopathy.  The thyroid gland is normal.  The bony thorax is intact.  No destructive bone lesions or spinal canal compromise.  Stable surgical changes related to a right thoracotomy.  The heart is normal in size.  No pericardial effusion.  No mediastinal or hilar lymphadenopathy.  The aorta is normal in caliber.  No dissection.  The esophagus is grossly normal.  Stable surgical changes from a right upper lobe lobectomy.  Stable surgical changes involving the right lung with persistent right basilar scarring changes.  No findings suspicious for recurrent tumor.  No worrisome right lung nodules.  No pleural effusion.  In the superior segment of the left lower lobe there is a 16 x 11.5 mm lesion.  This has changed since the prior examinations and appears more solid and has enlarged.  This is worrisome for a slow growing adenocarcinoma (BAC). Recommend biopsy.  The upper abdomen is unremarkable.  Mild fatty infiltration of the liver is noted.  IMPRESSION:  1.  Interval change in size and appearance of the superior segment left lower lobe lung nodule suspicious for  BAC.  Recommend  biopsy. 2.  Stable surgical changes involving the right hemithorax without findings for recurrent tumor. 3.  No mediastinal or hilar lymphadenopathy.  Original Report Authenticated By: P. Loralie Champagne, M.D.   Nm Bone Scan Whole Body  06/14/2012  *RADIOLOGY REPORT*  Clinical Data: Lung cancer, left hip pain, concern for metastatic bone disease.  NUCLEAR MEDICINE WHOLE BODY BONE SCINTIGRAPHY  Technique:  Whole body anterior and posterior images were obtained approximately 3 hours after intravenous injection of radiopharmaceutical.  Radiopharmaceutical: 25.8MILLI CURIE TC-MDP TECHNETIUM TC 23M MEDRONATE IV KIT  Comparison: PET CT scan 04/15/2009 CT chest 12/08/2011  Findings: There there is no focal abnormal uptake  within the axillary or appendicular skeleton to suggest bone metastasis. Particular attention directed towards the left hip.  There is a large volume of retained radiotracer within the bladder which obscures the sacrum.  There is a tiny focus of uptake injecting over the right distal femoral metadiaphysis on the anterior projection.  IMPRESSION:  1. No convincing evidence of metastasis. 2.  No abnormal uptake within the left hip. 3.  A single focus of uptake within the distal right femur.  While this could represent contamination, consider plain films, MRI,  or follow up bone scan (2 - 3 months) to  reevaluate.  Original Report Authenticated By: Genevive Bi, M.D.   Dg Epidurography  06/28/2012  *RADIOLOGY REPORT*  CLINICAL DATA:  Lumbosacral spondylosis without myelopathy. Displacement of the L4-5 lumbar disc.  Left L5 radiculitis.  LUMBAR EPIDURAL INJECTION: An interlaminar approach was performed on the left at L4-5.  The overlying skin was cleansed and anesthetized.  A 20 gauge spinal needle was advanced using loss-of-resistance technique.  Injection of 2cc of Omnipaque 180 confirmed epidural placement.  There was no evidence for intravascular or intrathecal spread of contrast.  I then injected  120 mg of Depo-Medrol and 3ml of 1% lidocaine.  The patient tolerated the procedure without evidence for complication. The patient  was observed for 20 minutes prior to discharge in stable neurologic condition.  FLUORO TIME:  20 seconds  IMPRESSIONS:  Technically successful first interlaminar epidural steroid injection on the left at L4-5.  Original Report Authenticated By: Jamesetta Orleans. MATTERN, M.D.   Nm Pet Image Initial (pi) Skull Base To Thigh  07/02/2012  *RADIOLOGY REPORT*  Clinical Data: Subsequent  treatment strategy for lung cancer.  NUCLEAR MEDICINE PET SKULL BASE TO THIGH  Fasting Blood Glucose:  111  Technique:  18.7 mCi F-18 FDG was injected intravenously. CT data was obtained and used for attenuation correction and anatomic localization only.  (This was not acquired as a diagnostic CT examination.) Additional exam technical data entered on technologist worksheet.  Comparison:  06/26/2012  Findings:  Neck: No hypermetabolic lymph nodes in the neck.  Chest:  No hypermetabolic supraclavicular or axillary lymph nodes. There is no abnormal FDG uptake within the mediastinum or hilar region.  Postop changes involving the right lung are identified.  There are no specific features identified to suggest residual or recurrence of hypermetabolic tumor within the right lung in the area of postoperative change.  Pulmonary nodule within the left lower lobe is identified.  This measures 1.2 cm.  There is no malignant range FDG uptake associated with this nodule. The SUV max is equal to 1.1.  Abdomen/Pelvis:  No abnormal hypermetabolic activity within the liver, pancreas, adrenal glands, or spleen.  No hypermetabolic lymph nodes in the abdomen or pelvis.  [place]:  No focal hypermetabolic activity to suggest skeletal metastasis.  IMPRESSION:  1.  There is no malignant range FDG uptake associated with the pulmonary nodule in the left lower lobe.  Despite lack of significant FDG uptake this may represent a  bronchoalveolar carcinoma which may present as a false-negative on PET CT. Recommend further evaluation with biopsy and pathologic correlation. 2.  Stable surgical changes in the right hemithorax without evidence for local tumor recurrence.  Original Report Authenticated By: Rosealee Albee, M.D.    ASSESSMENT: This is a very pleasant 66 years old [place] American male with history of stage IA non-small cell lung cancer status post right upper lobectomy in may of 2000 and has been observation  since that time. The patient presented with a suspicious nodule in the left lower lobe concerning for low grade adenocarcinoma. His PET scan showed no significant activity in this nodule but still suspicious.  PLAN: I discussed the PET scan results with the patient and his family and showed them the images. I recommended for the patient observation for now with repeat CT scan of the chest with contrast in 3 months. I will discuss his case at the weekly thoracic conference with the thoracic surgeons and pulmonologist to see the best approach for tissue diagnosis from this nodule. The patient was advised to call me immediately if he has any concerning symptoms in the interval.  All questions were answered. The patient knows to call the clinic with any problems, questions or concerns. We can certainly see the patient much sooner if necessary.

## 2012-07-15 DIAGNOSIS — M5137 Other intervertebral disc degeneration, lumbosacral region: Secondary | ICD-10-CM | POA: Diagnosis not present

## 2012-07-23 ENCOUNTER — Other Ambulatory Visit: Payer: Self-pay | Admitting: Orthopedic Surgery

## 2012-07-23 DIAGNOSIS — IMO0002 Reserved for concepts with insufficient information to code with codable children: Secondary | ICD-10-CM

## 2012-07-26 ENCOUNTER — Ambulatory Visit
Admission: RE | Admit: 2012-07-26 | Discharge: 2012-07-26 | Disposition: A | Discharge status: Home / Self Care | Payer: Medicare Other | Source: Ambulatory Visit | Attending: Orthopedic Surgery | Admitting: Orthopedic Surgery

## 2012-07-26 DIAGNOSIS — M25559 Pain in unspecified hip: Secondary | ICD-10-CM | POA: Diagnosis not present

## 2012-07-26 DIAGNOSIS — M47817 Spondylosis without myelopathy or radiculopathy, lumbosacral region: Secondary | ICD-10-CM | POA: Diagnosis not present

## 2012-07-26 DIAGNOSIS — IMO0002 Reserved for concepts with insufficient information to code with codable children: Secondary | ICD-10-CM

## 2012-07-26 MED ORDER — IOHEXOL 180 MG/ML  SOLN
1.0000 mL | Freq: Once | INTRAMUSCULAR | Status: AC | PRN
  Administered 2012-07-26: 1 mL via EPIDURAL

## 2012-07-26 MED ORDER — METHYLPREDNISOLONE ACETATE 40 MG/ML INJ SUSP (RADIOLOG
120.0000 mg | Freq: Once | INTRAMUSCULAR | Status: AC
  Administered 2012-07-26: 120 mg via EPIDURAL

## 2012-08-12 DIAGNOSIS — M5137 Other intervertebral disc degeneration, lumbosacral region: Secondary | ICD-10-CM | POA: Diagnosis not present

## 2012-09-30 ENCOUNTER — Telehealth: Payer: Self-pay | Admitting: General Practice

## 2012-09-30 NOTE — Telephone Encounter (Signed)
s/w pt and he is aware that we moved his 11/7 appt to 11/11 due to mkm being out of the office   aom

## 2012-10-07 DIAGNOSIS — M19079 Primary osteoarthritis, unspecified ankle and foot: Secondary | ICD-10-CM | POA: Diagnosis not present

## 2012-10-08 ENCOUNTER — Ambulatory Visit (HOSPITAL_COMMUNITY)
Admission: RE | Admit: 2012-10-08 | Discharge: 2012-10-08 | Disposition: A | Discharge status: Home / Self Care | Payer: Medicare Other | Source: Ambulatory Visit | Attending: Internal Medicine | Admitting: Internal Medicine

## 2012-10-08 ENCOUNTER — Other Ambulatory Visit (HOSPITAL_BASED_OUTPATIENT_CLINIC_OR_DEPARTMENT_OTHER): Payer: Medicare Other

## 2012-10-08 DIAGNOSIS — C3490 Malignant neoplasm of unspecified part of unspecified bronchus or lung: Secondary | ICD-10-CM | POA: Insufficient documentation

## 2012-10-08 LAB — CBC WITH DIFFERENTIAL/PLATELET
BASO%: 0.7 % (ref 0.0–2.0)
Basophils Absolute: 0.1 10*3/uL (ref 0.0–0.1)
EOS%: 1.8 % (ref 0.0–7.0)
HGB: 13.3 g/dL (ref 13.0–17.1)
MCH: 27.4 pg (ref 27.2–33.4)
MCHC: 32.4 g/dL (ref 32.0–36.0)
MCV: 84.7 fL (ref 79.3–98.0)
MONO%: 7.7 % (ref 0.0–14.0)
RBC: 4.85 10*6/uL (ref 4.20–5.82)
RDW: 14.4 % (ref 11.0–14.6)
lymph#: 2 10*3/uL (ref 0.9–3.3)

## 2012-10-08 LAB — COMPREHENSIVE METABOLIC PANEL (CC13)
ALT: 23 U/L (ref 0–55)
AST: 23 U/L (ref 5–34)
Albumin: 3.5 g/dL (ref 3.5–5.0)
Alkaline Phosphatase: 58 U/L (ref 40–150)
BUN: 11 mg/dL (ref 7.0–26.0)
Calcium: 9.3 mg/dL (ref 8.4–10.4)
Chloride: 110 mEq/L — ABNORMAL HIGH (ref 98–107)
Potassium: 3.6 mEq/L (ref 3.5–5.1)
Sodium: 141 mEq/L (ref 136–145)

## 2012-10-08 MED ORDER — IOHEXOL 300 MG/ML  SOLN
80.0000 mL | Freq: Once | INTRAMUSCULAR | Status: AC | PRN
  Administered 2012-10-08: 80 mL via INTRAVENOUS

## 2012-10-10 ENCOUNTER — Ambulatory Visit: Payer: Medicare Other | Admitting: Internal Medicine

## 2012-10-14 ENCOUNTER — Ambulatory Visit (HOSPITAL_BASED_OUTPATIENT_CLINIC_OR_DEPARTMENT_OTHER): Payer: Medicare Other | Admitting: Internal Medicine

## 2012-10-14 ENCOUNTER — Telehealth: Payer: Self-pay | Admitting: Internal Medicine

## 2012-10-14 VITALS — BP 156/86 | HR 93 | Temp 98.0°F | Resp 20 | Ht 70.0 in | Wt 237.6 lb

## 2012-10-14 DIAGNOSIS — C3430 Malignant neoplasm of lower lobe, unspecified bronchus or lung: Secondary | ICD-10-CM | POA: Diagnosis not present

## 2012-10-14 DIAGNOSIS — C3490 Malignant neoplasm of unspecified part of unspecified bronchus or lung: Secondary | ICD-10-CM

## 2012-10-14 NOTE — Telephone Encounter (Signed)
appts made and printed for pt pt aware that cen. sch will call with appt for scan

## 2012-10-14 NOTE — Progress Notes (Signed)
[facility] Telephone:(336) [phone]   Fax:(336) [phone]  OFFICE PROGRESS NOTE  [name], MD 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   PRIOR THERAPY: Status post right upper lobectomy on Apr 30, 2009 under the care of Dr. Edwyna Shell.   CURRENT THERAPY: Observation.  INTERVAL HISTORY: Bobby [name] 66 y.o. male returns to the clinic today for routine three-month followup visit. The patient is feeling fine today with no specific complaints. He denied having any significant weight loss or night sweats. He denied having any chest pain, shortness breath, cough or hemoptysis. The patient has repeat CT scan of the chest performed recently and he is here today for evaluation and discussion of his scan results.  MEDICAL HISTORY: Past Medical History  Diagnosis Date  . Hypertension   . Cancer     hx lung CA 2010  . Cataract   . Diabetes mellitus   . Hyperlipidemia     ALLERGIES:   has no known allergies.  MEDICATIONS:  Current Outpatient Prescriptions  Medication Sig Dispense Refill  . aspirin 81 MG tablet Take 81 mg by mouth daily.        . hydrOXYzine (ATARAX) 25 MG tablet Take 25 mg by mouth as needed. For itching      . ramipril (ALTACE) 1.25 MG capsule Take 1.25 mg by mouth Daily.      Marland Kitchen SIMCOR 500-20 MG 24 hr tablet Take by mouth At bedtime. Simcor 500-20 mg. Take 1 at bedtime      . sitaGLIPtan-metformin (JANUMET) 50-500 MG per tablet Take 1 tablet by mouth 2 (two) times daily with a meal.        . Thiamine HCl (VITAMIN B-1) 100 MG tablet Take 100 mg by mouth daily.         Current Facility-Administered Medications  Medication Dose Route Frequency Provider Last Rate Last Dose  . 0.9 %  sodium chloride infusion  500 mL Intravenous Continuous Louis Meckel, MD        SURGICAL HISTORY:  Past Surgical  History  Procedure Date  . Carpal tunnel release     right  . Rotator cuff repair     left  . Knee arthroscopy     left  . Lung removal, partial     right    REVIEW OF SYSTEMS:  A comprehensive review of systems was negative.   PHYSICAL EXAMINATION: General appearance: alert, cooperative and no distress Head: Normocephalic, without obvious abnormality, atraumatic Neck: no adenopathy Lymph nodes: Cervical, supraclavicular, and axillary nodes normal. Resp: clear to auscultation bilaterally Cardio: regular rate and rhythm, S1, S2 normal, no murmur, click, rub or gallop GI: soft, non-tender; bowel sounds normal; no masses,  no organomegaly Extremities: extremities normal, atraumatic, no cyanosis or edema  ECOG PERFORMANCE STATUS: 0 - Asymptomatic  There were no vitals taken for this visit.  LABORATORY DATA: Lab Results  Component Value Date   WBC 7.5 10/08/2012   HGB 13.3 10/08/2012   HCT 41.1 10/08/2012   MCV 84.7 10/08/2012   PLT 233 10/08/2012      Chemistry      Component Value Date/Time   NA 141 10/08/2012 1015   NA 137 06/26/2012 0941   NA 142 06/03/2010 0955   K 3.6 10/08/2012 1015   K 3.9 06/26/2012 0941   K 4.0 06/03/2010  0955   CL 110* 10/08/2012 1015   CL 102 06/26/2012 0941   CL 109 06/03/2010 0955   CO2 23 10/08/2012 1015   CO2 27 06/26/2012 0941   CO2 24 06/03/2010 0955   BUN 11.0 10/08/2012 1015   BUN 16 06/26/2012 0941   BUN 16 06/03/2010 0955   CREATININE 0.8 10/08/2012 1015   CREATININE 1.2 06/26/2012 0941   CREATININE 1.06 06/03/2010 0955      Component Value Date/Time   CALCIUM 9.3 10/08/2012 1015   CALCIUM 9.0 06/26/2012 0941   CALCIUM 9.1 06/03/2010 0955   ALKPHOS 58 10/08/2012 1015   ALKPHOS 43 06/26/2012 0941   ALKPHOS 47 06/03/2010 0955   AST 23 10/08/2012 1015   AST 20 06/26/2012 0941   AST 28 06/03/2010 0955   ALT 23 10/08/2012 1015   ALT 27 06/03/2010 0955   BILITOT 0.28 10/08/2012 1015   BILITOT 0.60 06/26/2012 0941   BILITOT 0.6 06/03/2010 0955        RADIOGRAPHIC STUDIES: Ct Chest W Contrast  10/08/2012  *RADIOLOGY REPORT*  Clinical Data: Restaging lung cancer  CT CHEST WITH CONTRAST  Technique:  Multidetector CT imaging of the chest was performed following the standard protocol during bolus administration of intravenous contrast.  Contrast: 80mL OMNIPAQUE IOHEXOL 300 MG/ML  SOLN  Comparison: 07/02/2012  Findings: Lungs/pleura: There is no pleural effusion.  Postsurgical changes in volume loss involving the right lung again noted.  No suspicious features to suggest residual or recurrent local tumor. Pulmonary nodule within the left lower lobe measures 1.1 cm, image 32 and is unchanged from previous exam.  No new or enlarging pulmonary parenchymal nodules or masses.  Heart/Mediastinum: 7 mm right paratracheal lymph node is identified, image number 11.  Compared with 06/26/2012 this is increased from 5 mm previously.  7.2 mm subcarinal lymph node is identified, image 27.  Previously 6.3 mm. Heart size appears normal.  No pericardial effusion.  Upper abdomen: Mild diffuse fatty infiltration of the liver noted. The adrenal glands both appear normal.  No mass identified.  Bones/Musculoskeletal:  Mild multilevel spondylosis is identified within the thoracic spine.  No aggressive lytic or sclerotic bone lesions identified.  IMPRESSION:  1.  No acute cardiopulmonary abnormalities.  2.  Stable 1.1 cm left lower lobe pulmonary nodule. 3.  Slight increase in size of sub centimeter right paratracheal lymph node which now measures 7 mm.  Attention on follow-up imaging advised.   Original Report Authenticated By: Signa Kell, M.D.     ASSESSMENT: This is a very pleasant 66 years old African American male with history of stage IA non-small cell lung cancer diagnosed in may of 2010 status post right upper lobectomy and has been observation since that time was no evidence for disease recurrence except for questionable left lower lobe nodule that was not  hypermetabolic on the previous PET scan and did not show any evidence for disease progression since the last scan 3 months ago.  PLAN: I discussed the scan results with the patient. I recommended for him to continue on observation with repeat CT scan of the chest in 4 months for reevaluation of his disease.  He was advised to call me immediately if he has any concerning symptoms in the interval.  All questions were answered. The patient knows to call the clinic with any problems, questions or concerns. We can certainly see the patient much sooner if necessary.

## 2012-10-14 NOTE — Patient Instructions (Signed)
No evidence for disease progression on his recent scan.  Followup in 4 months with repeat CT scan of the chest. 

## 2012-10-15 DIAGNOSIS — E782 Mixed hyperlipidemia: Secondary | ICD-10-CM | POA: Diagnosis not present

## 2012-10-15 DIAGNOSIS — I10 Essential (primary) hypertension: Secondary | ICD-10-CM | POA: Diagnosis not present

## 2012-10-15 DIAGNOSIS — Z23 Encounter for immunization: Secondary | ICD-10-CM | POA: Diagnosis not present

## 2012-10-15 DIAGNOSIS — E663 Overweight: Secondary | ICD-10-CM | POA: Diagnosis not present

## 2012-10-15 DIAGNOSIS — E119 Type 2 diabetes mellitus without complications: Secondary | ICD-10-CM | POA: Diagnosis not present

## 2012-12-12 DIAGNOSIS — E119 Type 2 diabetes mellitus without complications: Secondary | ICD-10-CM | POA: Diagnosis not present

## 2012-12-12 DIAGNOSIS — H35039 Hypertensive retinopathy, unspecified eye: Secondary | ICD-10-CM | POA: Diagnosis not present

## 2012-12-12 DIAGNOSIS — H2510 Age-related nuclear cataract, unspecified eye: Secondary | ICD-10-CM | POA: Diagnosis not present

## 2012-12-19 DIAGNOSIS — J069 Acute upper respiratory infection, unspecified: Secondary | ICD-10-CM | POA: Diagnosis not present

## 2012-12-20 DIAGNOSIS — C679 Malignant neoplasm of bladder, unspecified: Secondary | ICD-10-CM | POA: Diagnosis not present

## 2012-12-20 DIAGNOSIS — E291 Testicular hypofunction: Secondary | ICD-10-CM | POA: Diagnosis not present

## 2012-12-20 DIAGNOSIS — N400 Enlarged prostate without lower urinary tract symptoms: Secondary | ICD-10-CM | POA: Diagnosis not present

## 2012-12-31 DIAGNOSIS — J40 Bronchitis, not specified as acute or chronic: Secondary | ICD-10-CM | POA: Diagnosis not present

## 2012-12-31 DIAGNOSIS — R05 Cough: Secondary | ICD-10-CM | POA: Diagnosis not present

## 2012-12-31 DIAGNOSIS — R059 Cough, unspecified: Secondary | ICD-10-CM | POA: Diagnosis not present

## 2013-01-21 DIAGNOSIS — H9209 Otalgia, unspecified ear: Secondary | ICD-10-CM | POA: Diagnosis not present

## 2013-01-24 ENCOUNTER — Telehealth: Payer: Self-pay | Admitting: Internal Medicine

## 2013-01-24 NOTE — Telephone Encounter (Signed)
, °

## 2013-02-11 ENCOUNTER — Ambulatory Visit (HOSPITAL_COMMUNITY)
Admission: RE | Admit: 2013-02-11 | Discharge: 2013-02-11 | Disposition: A | Discharge status: Home / Self Care | Payer: Medicare Other | Source: Ambulatory Visit | Attending: Internal Medicine | Admitting: Internal Medicine

## 2013-02-11 ENCOUNTER — Other Ambulatory Visit (HOSPITAL_BASED_OUTPATIENT_CLINIC_OR_DEPARTMENT_OTHER): Payer: Medicare Other

## 2013-02-11 DIAGNOSIS — Z85118 Personal history of other malignant neoplasm of bronchus and lung: Secondary | ICD-10-CM | POA: Insufficient documentation

## 2013-02-11 DIAGNOSIS — C3430 Malignant neoplasm of lower lobe, unspecified bronchus or lung: Secondary | ICD-10-CM

## 2013-02-11 DIAGNOSIS — R911 Solitary pulmonary nodule: Secondary | ICD-10-CM | POA: Diagnosis not present

## 2013-02-11 DIAGNOSIS — C3490 Malignant neoplasm of unspecified part of unspecified bronchus or lung: Secondary | ICD-10-CM

## 2013-02-11 LAB — COMPREHENSIVE METABOLIC PANEL (CC13)
AST: 20 U/L (ref 5–34)
BUN: 12.6 mg/dL (ref 7.0–26.0)
CO2: 24 mEq/L (ref 22–29)
Calcium: 9 mg/dL (ref 8.4–10.4)
Chloride: 108 mEq/L — ABNORMAL HIGH (ref 98–107)
Creatinine: 0.9 mg/dL (ref 0.7–1.3)
Total Bilirubin: 0.25 mg/dL (ref 0.20–1.20)

## 2013-02-11 LAB — CBC WITH DIFFERENTIAL/PLATELET
Basophils Absolute: 0 10*3/uL (ref 0.0–0.1)
HCT: 41 % (ref 38.4–49.9)
HGB: 13.1 g/dL (ref 13.0–17.1)
LYMPH%: 24.2 % (ref 14.0–49.0)
MONO#: 0.8 10*3/uL (ref 0.1–0.9)
NEUT%: 64.1 % (ref 39.0–75.0)
Platelets: 228 10*3/uL (ref 140–400)
WBC: 8.2 10*3/uL (ref 4.0–10.3)
lymph#: 2 10*3/uL (ref 0.9–3.3)

## 2013-02-11 MED ORDER — IOHEXOL 300 MG/ML  SOLN
100.0000 mL | Freq: Once | INTRAMUSCULAR | Status: AC | PRN
  Administered 2013-02-11: 100 mL via INTRAVENOUS

## 2013-02-17 ENCOUNTER — Other Ambulatory Visit: Payer: Medicare Other | Admitting: Lab

## 2013-02-19 ENCOUNTER — Telehealth: Payer: Self-pay | Admitting: Internal Medicine

## 2013-02-19 ENCOUNTER — Encounter: Payer: Self-pay | Admitting: Internal Medicine

## 2013-02-19 ENCOUNTER — Ambulatory Visit (HOSPITAL_BASED_OUTPATIENT_CLINIC_OR_DEPARTMENT_OTHER): Payer: Medicare Other | Admitting: Internal Medicine

## 2013-02-19 VITALS — BP 148/82 | HR 88 | Temp 98.2°F | Resp 20 | Ht 70.0 in | Wt 240.0 lb

## 2013-02-19 DIAGNOSIS — R911 Solitary pulmonary nodule: Secondary | ICD-10-CM | POA: Diagnosis not present

## 2013-02-19 DIAGNOSIS — Z85118 Personal history of other malignant neoplasm of bronchus and lung: Secondary | ICD-10-CM | POA: Diagnosis not present

## 2013-02-19 DIAGNOSIS — C3490 Malignant neoplasm of unspecified part of unspecified bronchus or lung: Secondary | ICD-10-CM

## 2013-02-19 NOTE — Telephone Encounter (Signed)
Gave pt appt for Serptember 2014 lab and MD

## 2013-02-19 NOTE — Progress Notes (Signed)
[name] Telephone:(336) [phone]   Fax:([phone]  OFFICE PROGRESS NOTE  Bobby Apo, MD 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   PRIOR THERAPY: Status post right upper lobectomy on Apr 30, 2009 under the care of Dr. Edwyna Shell.   CURRENT THERAPY: Observation.  INTERVAL HISTORY: Bobby [name] 67 y.o. male returns to the clinic today for 4 month followup visit. The patient is feeling fine today with no specific complaints. He denied having any significant chest pain, shortness of breath, cough or hemoptysis. He denied having any weight loss or night sweats. The patient had repeat CT scan of the chest performed recently and he is here for evaluation and discussion of his scan results.  MEDICAL HISTORY: Past Medical History  Diagnosis Date  . Hypertension   . Cancer     hx lung CA 2010  . Cataract   . Diabetes mellitus   . Hyperlipidemia     ALLERGIES:  has No Known Allergies.  MEDICATIONS:  Current Outpatient Prescriptions  Medication Sig Dispense Refill  . amLODipine-benazepril (LOTREL) 5-20 MG per capsule Take 1 capsule by mouth daily.      Marland Kitchen aspirin 81 MG tablet Take 81 mg by mouth daily.        . hydrOXYzine (ATARAX) 25 MG tablet Take 25 mg by mouth as needed. For itching      . ramipril (ALTACE) 1.25 MG capsule Take 1.25 mg by mouth Daily.      Marland Kitchen SIMCOR 500-20 MG 24 hr tablet Take by mouth At bedtime. Simcor 500-20 mg. Take 1 at bedtime      . sitaGLIPtan-metformin (JANUMET) 50-500 MG per tablet Take 1 tablet by mouth 2 (two) times daily with a meal.        . Thiamine HCl (VITAMIN B-1) 100 MG tablet Take 100 mg by mouth daily.         No current facility-administered medications for this visit.    SURGICAL HISTORY:  Past Surgical History  Procedure Laterality Date  . Carpal tunnel  release      right  . Rotator cuff repair      left  . Knee arthroscopy      left  . Lung removal, partial      right    REVIEW OF SYSTEMS:  A comprehensive review of systems was negative.   PHYSICAL EXAMINATION: General appearance: alert, cooperative and no distress Head: Normocephalic, without obvious abnormality, atraumatic Lymph nodes: Cervical, supraclavicular, and axillary nodes normal. Resp: clear to auscultation bilaterally Cardio: regular rate and rhythm, S1, S2 normal, no murmur, click, rub or gallop GI: soft, non-tender; bowel sounds normal; no masses,  no organomegaly Extremities: extremities normal, atraumatic, no cyanosis or edema  ECOG PERFORMANCE STATUS: 0 - Asymptomatic  Blood pressure 148/82, pulse 88, temperature 98.2 F (36.8 C), temperature source Oral, resp. rate 20, height 5\' 10"  (1.778 m), weight 240 lb (108.863 kg).  LABORATORY DATA: Lab Results  Component Value Date   WBC 8.2 02/11/2013   HGB 13.1 02/11/2013   HCT 41.0 02/11/2013   MCV 80.7 02/11/2013   PLT 228 02/11/2013      Chemistry      Component Value Date/Time   NA 142 02/11/2013 0811   NA 137 06/26/2012 0941   NA 142 06/03/2010 0955   K 3.7 02/11/2013 8119  K 3.9 06/26/2012 0941   K 4.0 06/03/2010 0955   CL 108* 02/11/2013 0811   CL 102 06/26/2012 0941   CL 109 06/03/2010 0955   CO2 24 02/11/2013 0811   CO2 27 06/26/2012 0941   CO2 24 06/03/2010 0955   BUN 12.6 02/11/2013 0811   BUN 16 06/26/2012 0941   BUN 16 06/03/2010 0955   CREATININE 0.9 02/11/2013 0811   CREATININE 1.2 06/26/2012 0941   CREATININE 1.06 06/03/2010 0955      Component Value Date/Time   CALCIUM 9.0 02/11/2013 0811   CALCIUM 9.0 06/26/2012 0941   CALCIUM 9.1 06/03/2010 0955   ALKPHOS 61 02/11/2013 0811   ALKPHOS 43 06/26/2012 0941   ALKPHOS 47 06/03/2010 0955   AST 20 02/11/2013 0811   AST 20 06/26/2012 0941   AST 28 06/03/2010 0955   ALT 29 02/11/2013 0811   ALT 27 06/03/2010 0955   BILITOT 0.25 02/11/2013 0811   BILITOT 0.60 06/26/2012  0941   BILITOT 0.6 06/03/2010 0955       RADIOGRAPHIC STUDIES: Ct Chest W Contrast  02/11/2013  *RADIOLOGY REPORT*  Clinical Data: History of lung cancer .  Right lung resection Apr 10, 2009.  Follow-up.  CT CHEST WITH CONTRAST  Technique:  Multidetector CT imaging of the chest was performed following the standard protocol during bolus administration of intravenous contrast.  Contrast: OMNIPAQUE IOHEXOL 300 MG/ML  SOLN  Comparison: CT chest 11/05/2013and 12/08/2011  Findings: Index right paratracheal lymph node measures 6 to 7 mm short axis (previously 7 mm).  7 mm subcarinal lymph node is stable.  No enlarging mediastinal lymph nodes are identified. Thoracic aorta is normal in caliber and enhancement.  Heart size is normal.  Negative for pleural or pericardial effusion.  There are postsurgical changes of right upper lobectomy. There is stable scarring laterally in the right lower lobe. A 14 mm ground- glass nodule in the left lower lobe is stable in appearance compared to chest CTs of 12/08/2011 and 06/03/2010.  This nodule is stable to minimally increased chest CT of December 2010.  No new pulmonary nodule is identified.  Small portion of the right middle lobe herniates lateral to the ribs.  The trachea and mainstem bronchi are patent.  Thoracic spine vertebral bodies are normal in height and alignment.  No suspicious osseous lesion.  There is fatty infiltration of the visualized portion of the liver. No hepatic mass is seen (liver incompletely imaged.  Adrenal glands appear within normal limits (inferior aspect of left adrenal gland incompletely imaged).  IMPRESSION: 1.  Stable chest CT.  Sub-centimeter right paratracheal and subcarinal lymph nodes are unchanged. No acute abnormalities identified. 2.  Stable 14 mm ground-glass nodule in the left lower lobe. Yearly surveillance with noncontrast chest CT is suggested.   Original Report Authenticated By: Britta Mccreedy, M.D.     ASSESSMENT: This is a  very pleasant 67 years old [place] American male with history of stage IA non-small cell lung cancer status post right upper lobectomy. He is currently on observation with no evidence for disease recurrence. The left lower lobe nodule is stable.  PLAN: I discussed the scan results with the patient today. I recommended for him to continue on observation with repeat CT of the chest in 6 months. He would come back for followup visit at that time. He was advised to call immediately she has any concerning symptoms in the interval.  All questions were answered. The patient knows to call the clinic  with any problems, questions or concerns. We can certainly see the patient much sooner if necessary.

## 2013-02-19 NOTE — Patient Instructions (Addendum)
The scan showed no evidence for disease progression. Followup in 6 months with repeat CT scan of the chest.

## 2013-02-24 DIAGNOSIS — I10 Essential (primary) hypertension: Secondary | ICD-10-CM | POA: Diagnosis not present

## 2013-02-24 DIAGNOSIS — E119 Type 2 diabetes mellitus without complications: Secondary | ICD-10-CM | POA: Diagnosis not present

## 2013-02-24 DIAGNOSIS — E782 Mixed hyperlipidemia: Secondary | ICD-10-CM | POA: Diagnosis not present

## 2013-02-24 DIAGNOSIS — E663 Overweight: Secondary | ICD-10-CM | POA: Diagnosis not present

## 2013-04-08 DIAGNOSIS — L503 Dermatographic urticaria: Secondary | ICD-10-CM | POA: Diagnosis not present

## 2013-04-08 DIAGNOSIS — L738 Other specified follicular disorders: Secondary | ICD-10-CM | POA: Diagnosis not present

## 2013-06-18 DIAGNOSIS — E782 Mixed hyperlipidemia: Secondary | ICD-10-CM | POA: Diagnosis not present

## 2013-06-18 DIAGNOSIS — Z0000 Encounter for general adult medical examination without abnormal findings: Secondary | ICD-10-CM | POA: Diagnosis not present

## 2013-06-18 DIAGNOSIS — E119 Type 2 diabetes mellitus without complications: Secondary | ICD-10-CM | POA: Diagnosis not present

## 2013-06-18 DIAGNOSIS — I10 Essential (primary) hypertension: Secondary | ICD-10-CM | POA: Diagnosis not present

## 2013-06-20 DIAGNOSIS — Z8551 Personal history of malignant neoplasm of bladder: Secondary | ICD-10-CM | POA: Diagnosis not present

## 2013-06-20 DIAGNOSIS — N400 Enlarged prostate without lower urinary tract symptoms: Secondary | ICD-10-CM | POA: Diagnosis not present

## 2013-06-20 DIAGNOSIS — E291 Testicular hypofunction: Secondary | ICD-10-CM | POA: Diagnosis not present

## 2013-06-30 ENCOUNTER — Other Ambulatory Visit: Payer: Self-pay | Admitting: Urology

## 2013-07-10 ENCOUNTER — Encounter (HOSPITAL_BASED_OUTPATIENT_CLINIC_OR_DEPARTMENT_OTHER): Payer: Self-pay | Admitting: *Deleted

## 2013-07-14 ENCOUNTER — Encounter (HOSPITAL_BASED_OUTPATIENT_CLINIC_OR_DEPARTMENT_OTHER): Payer: Self-pay | Admitting: *Deleted

## 2013-07-14 NOTE — Progress Notes (Addendum)
SPOKE W/ PT WIFE. NPO AFTER MN. ARRIVES AT 0715. NEEDS ISTAT AND EKG. CURRENT CHEST CT IN EPIC AND CHART.

## 2013-08-19 ENCOUNTER — Other Ambulatory Visit (HOSPITAL_BASED_OUTPATIENT_CLINIC_OR_DEPARTMENT_OTHER): Payer: Medicare Other | Admitting: Lab

## 2013-08-19 ENCOUNTER — Ambulatory Visit (HOSPITAL_COMMUNITY)
Admission: RE | Admit: 2013-08-19 | Discharge: 2013-08-19 | Disposition: A | Discharge status: Home / Self Care | Payer: Medicare Other | Source: Ambulatory Visit | Attending: Internal Medicine | Admitting: Internal Medicine

## 2013-08-19 ENCOUNTER — Encounter (HOSPITAL_COMMUNITY): Payer: Self-pay

## 2013-08-19 DIAGNOSIS — R918 Other nonspecific abnormal finding of lung field: Secondary | ICD-10-CM | POA: Diagnosis not present

## 2013-08-19 DIAGNOSIS — C3490 Malignant neoplasm of unspecified part of unspecified bronchus or lung: Secondary | ICD-10-CM | POA: Diagnosis not present

## 2013-08-19 DIAGNOSIS — I700 Atherosclerosis of aorta: Secondary | ICD-10-CM | POA: Insufficient documentation

## 2013-08-19 DIAGNOSIS — K7689 Other specified diseases of liver: Secondary | ICD-10-CM | POA: Insufficient documentation

## 2013-08-19 DIAGNOSIS — Z902 Acquired absence of lung [part of]: Secondary | ICD-10-CM | POA: Diagnosis not present

## 2013-08-19 DIAGNOSIS — R911 Solitary pulmonary nodule: Secondary | ICD-10-CM | POA: Insufficient documentation

## 2013-08-19 LAB — CBC WITH DIFFERENTIAL/PLATELET
BASO%: 0.7 % (ref 0.0–2.0)
EOS%: 1.9 % (ref 0.0–7.0)
MCH: 25.8 pg — ABNORMAL LOW (ref 27.2–33.4)
MCHC: 32 g/dL (ref 32.0–36.0)
MONO#: 0.5 10*3/uL (ref 0.1–0.9)
NEUT%: 58 % (ref 39.0–75.0)
RBC: 5.22 10*6/uL (ref 4.20–5.82)
RDW: 15.6 % — ABNORMAL HIGH (ref 11.0–14.6)
WBC: 6.8 10*3/uL (ref 4.0–10.3)
lymph#: 2.1 10*3/uL (ref 0.9–3.3)

## 2013-08-19 LAB — COMPREHENSIVE METABOLIC PANEL (CC13)
ALT: 19 U/L (ref 0–55)
AST: 19 U/L (ref 5–34)
Calcium: 9 mg/dL (ref 8.4–10.4)
Chloride: 109 mEq/L (ref 98–109)
Creatinine: 0.9 mg/dL (ref 0.7–1.3)
Potassium: 3.9 mEq/L (ref 3.5–5.1)
Sodium: 142 mEq/L (ref 136–145)
Total Protein: 6.9 g/dL (ref 6.4–8.3)

## 2013-08-19 MED ORDER — IOHEXOL 300 MG/ML  SOLN
80.0000 mL | Freq: Once | INTRAMUSCULAR | Status: AC | PRN
  Administered 2013-08-19: 80 mL via INTRAVENOUS

## 2013-08-22 ENCOUNTER — Other Ambulatory Visit (HOSPITAL_COMMUNITY): Payer: Medicare Other

## 2013-08-25 ENCOUNTER — Ambulatory Visit (HOSPITAL_BASED_OUTPATIENT_CLINIC_OR_DEPARTMENT_OTHER): Payer: Medicare Other | Admitting: Internal Medicine

## 2013-08-25 ENCOUNTER — Encounter: Payer: Self-pay | Admitting: Internal Medicine

## 2013-08-25 VITALS — BP 150/82 | HR 83 | Temp 97.7°F | Resp 18 | Ht 70.0 in | Wt 236.2 lb

## 2013-08-25 DIAGNOSIS — C3490 Malignant neoplasm of unspecified part of unspecified bronchus or lung: Secondary | ICD-10-CM | POA: Diagnosis not present

## 2013-08-25 NOTE — Progress Notes (Signed)
[facility] Telephone:(336) [phone]   Fax:(336) [phone]  OFFICE PROGRESS NOTE  [name], MD [address] [place] [address]  PRINCIPAL DIAGNOSIS: Stage IA (T1a N0 M0) non-small cell lung cancer, adenocarcinoma with bronchoalveolar features diagnosed in May 2010.   PRIOR THERAPY: Status post right upper lobectomy on Apr 30, 2009 under the care of Dr. Edwyna Shell.   CURRENT THERAPY: Observation.  INTERVAL HISTORY: SUFYAN MEIDINGER 67 y.o. male returns to the clinic today for six-month followup visit. The patient is feeling fine today with no specific complaints. He denied having any significant weight loss or night sweats. He has no chest pain, shortness breath, cough or hemoptysis. The patient has no nausea or vomiting. He has repeat CT scan of the chest performed recently and he is here for evaluation and discussion of his lab and her scan results.  MEDICAL HISTORY: Past Medical History  Diagnosis Date  . Hypertension   . Hyperlipidemia   . History of lung cancer     STAGE 1A NON-SMALL CELL LUNG CARCINOMA ---  04-10-2009   S/P RIGHT UPPER LOBECTOMY , NO CHEMORADIATION--  NO RECURRENCE  (ONCOLOGIST-- DR [facility])  . Type 2 diabetes mellitus   . BPH (benign prostatic hypertrophy)   . Pulmonary nodule seen on imaging study     STABLE LEFT LOWER LOBE NODULE  PER CHEST CT--  MONITORED BY DR [facility]  . Frequency of urination   . Urgency of urination     ALLERGIES:  has No Known Allergies.  MEDICATIONS:  Current Outpatient Prescriptions  Medication Sig Dispense Refill  . amLODipine-benazepril (LOTREL) 5-20 MG per capsule Take 1 capsule by mouth daily.      Marland Kitchen aspirin 81 MG tablet Take 81 mg by mouth daily.       Marland Kitchen b complex vitamins tablet Take 1 tablet by mouth daily.      . hydrOXYzine (ATARAX) 25 MG tablet Take 25 mg by mouth as needed. For itching      . ramipril (ALTACE) 1.25 MG capsule Take 1.25 mg by mouth Daily.      Marland Kitchen SIMCOR 500-20  MG 24 hr tablet Take 1 tablet by mouth At bedtime. Simcor 500-20 mg. Take 1 at bedtime      . sitaGLIPtan-metformin (JANUMET) 50-500 MG per tablet Take 1 tablet by mouth 2 (two) times daily with a meal.        No current facility-administered medications for this visit.    SURGICAL HISTORY:  Past Surgical History  Procedure Laterality Date  . Carpal tunnel release Right   . Knee arthroscopy w/ meniscectomy Left 10-31-2004  . Left shoulder arthroscopy/ acromioplasty/ decompression/ synovectomy  11-20-2007  . Video assisted thoracoscopy (vats)/ lobectomy Right 04-10-2009  DR BURNEY    RIGHT UPPER LOBECTOMY AND NODE DISSECTION  . Cardiovascular stress test  04-08-2009    NORMAL NUCLEAR STUDY/ NO ISCHEMIA/ EF 60%    REVIEW OF SYSTEMS:  A comprehensive review of systems was negative.   PHYSICAL EXAMINATION: General appearance: alert, cooperative and no distress Head: Normocephalic, without obvious abnormality, atraumatic Neck: no adenopathy, supple, symmetrical, trachea midline and thyroid not enlarged, symmetric, no tenderness/mass/nodules Lymph nodes: Cervical, supraclavicular, and axillary nodes normal. Resp: clear to auscultation bilaterally and normal percussion bilaterally Cardio: regular rate and rhythm, S1, S2 normal, no murmur, click, rub or gallop GI: soft, non-tender; bowel sounds normal; no masses,  no organomegaly Extremities: extremities normal, atraumatic, no cyanosis or edema  ECOG PERFORMANCE STATUS: 1 - Symptomatic but completely ambulatory  Blood pressure 150/82, pulse 83, temperature 97.7 F (36.5 C), temperature source Oral, resp. rate 18, height 5\' 10"  (1.778 m), weight 236 lb 3.2 oz (107.14 kg), SpO2 100.00%.  LABORATORY DATA: Lab Results  Component Value Date   WBC 6.8 08/19/2013   HGB 13.5 08/19/2013   HCT 42.1 08/19/2013   MCV 80.7 08/19/2013   PLT 245 08/19/2013      Chemistry      Component Value Date/Time   NA 142 08/19/2013 0758   NA 137 06/26/2012  0941   NA 142 06/03/2010 0955   K 3.9 08/19/2013 0758   K 3.9 06/26/2012 0941   K 4.0 06/03/2010 0955   CL 108* 02/11/2013 0811   CL 102 06/26/2012 0941   CL 109 06/03/2010 0955   CO2 24 08/19/2013 0758   CO2 27 06/26/2012 0941   CO2 24 06/03/2010 0955   BUN 15.4 08/19/2013 0758   BUN 16 06/26/2012 0941   BUN 16 06/03/2010 0955   CREATININE 0.9 08/19/2013 0758   CREATININE 1.2 06/26/2012 0941   CREATININE 1.06 06/03/2010 0955      Component Value Date/Time   CALCIUM 9.0 08/19/2013 0758   CALCIUM 9.0 06/26/2012 0941   CALCIUM 9.1 06/03/2010 0955   ALKPHOS 57 08/19/2013 0758   ALKPHOS 43 06/26/2012 0941   ALKPHOS 47 06/03/2010 0955   AST 19 08/19/2013 0758   AST 20 06/26/2012 0941   AST 28 06/03/2010 0955   ALT 19 08/19/2013 0758   ALT 33 06/26/2012 0941   ALT 27 06/03/2010 0955   BILITOT 0.43 08/19/2013 0758   BILITOT 0.60 06/26/2012 0941   BILITOT 0.6 06/03/2010 0955       RADIOGRAPHIC STUDIES: Ct Chest W Contrast  08/19/2013   CLINICAL DATA:  Restaging lung cancer diagnosed in May 2010 status post partial lung resection.  EXAM: CT CHEST WITH CONTRAST  TECHNIQUE: Multidetector CT imaging of the chest was performed during intravenous contrast administration.  CONTRAST:  80mL OMNIPAQUE IOHEXOL 300 MG/ML  SOLN  COMPARISON:  Chest CTs dated 02/11/2013 and 10/08/2012.  FINDINGS: There are stable postsurgical changes status post right upper lobe resection. Scarring peripherally at the right lung base appears unchanged. There is a stable focal mixed solid and ground-glass opacity in the left lower lobe measuring 1.3 cm on image 35. This appears unchanged from 06/03/2010. This does not appear grossly changed from the oldest available prior study dated 04/03/2009. No new or enlarging pulmonary nodules are identified.  There are no enlarged mediastinal or hilar lymph nodes. There is stable mild atherosclerosis of the aorta, great vessels and coronary arteries. There is no significant pleural or pericardial effusion.  The  visualized upper abdomen has a stable appearance. There is no adrenal mass. Hepatic steatosis is again noted. There are no worrisome osseous findings.  IMPRESSION: 1. Stable postsurgical findings status post right upper lobe resection. No evidence of local recurrence or metastatic disease. 2. Stable left lower lobe focal mixed solid and ground-glass nodule over greater than 4 years.   Electronically Signed   By: Roxy Horseman   On: 08/19/2013 09:35    ASSESSMENT AND PLAN: This is a very pleasant 67 years old African American male with stage IA non-small cell lung cancer status post right upper lobectomy and has been observation since May of 2010 daily with no evidence for disease recurrence. I discussed the scan results with the patient today. I recommended for him  to continue on observation with repeat CT scan of the chest in one year. He was advised to call immediately if he has any concerning symptoms in the interval.  The patient voices understanding of current disease status and treatment options and is in agreement with the current care plan.  All questions were answered. The patient knows to call the clinic with any problems, questions or concerns. We can certainly see the patient much sooner if necessary.

## 2013-08-27 ENCOUNTER — Telehealth: Payer: Self-pay | Admitting: Internal Medicine

## 2013-08-27 DIAGNOSIS — M5412 Radiculopathy, cervical region: Secondary | ICD-10-CM | POA: Diagnosis not present

## 2013-08-27 NOTE — Telephone Encounter (Signed)
s.w. pt and advised on sept 2015 appt...pt ok and awre

## 2013-09-12 ENCOUNTER — Other Ambulatory Visit: Payer: Self-pay | Admitting: Orthopedic Surgery

## 2013-09-12 ENCOUNTER — Ambulatory Visit
Admission: RE | Admit: 2013-09-12 | Discharge: 2013-09-12 | Disposition: A | Discharge status: Home / Self Care | Payer: Medicare Other | Source: Ambulatory Visit | Attending: Orthopedic Surgery | Admitting: Orthopedic Surgery

## 2013-09-12 DIAGNOSIS — M5030 Other cervical disc degeneration, unspecified cervical region: Secondary | ICD-10-CM | POA: Diagnosis not present

## 2013-09-12 DIAGNOSIS — M5412 Radiculopathy, cervical region: Secondary | ICD-10-CM

## 2013-09-12 DIAGNOSIS — M47812 Spondylosis without myelopathy or radiculopathy, cervical region: Secondary | ICD-10-CM | POA: Diagnosis not present

## 2013-10-06 DIAGNOSIS — M719 Bursopathy, unspecified: Secondary | ICD-10-CM | POA: Diagnosis not present

## 2013-10-06 DIAGNOSIS — M67919 Unspecified disorder of synovium and tendon, unspecified shoulder: Secondary | ICD-10-CM | POA: Diagnosis not present

## 2013-10-08 ENCOUNTER — Other Ambulatory Visit: Payer: Self-pay | Admitting: Orthopedic Surgery

## 2013-10-08 DIAGNOSIS — M542 Cervicalgia: Secondary | ICD-10-CM

## 2013-10-15 DIAGNOSIS — R809 Proteinuria, unspecified: Secondary | ICD-10-CM | POA: Diagnosis not present

## 2013-10-15 DIAGNOSIS — E782 Mixed hyperlipidemia: Secondary | ICD-10-CM | POA: Diagnosis not present

## 2013-10-15 DIAGNOSIS — E1129 Type 2 diabetes mellitus with other diabetic kidney complication: Secondary | ICD-10-CM | POA: Diagnosis not present

## 2013-10-15 DIAGNOSIS — E663 Overweight: Secondary | ICD-10-CM | POA: Diagnosis not present

## 2013-10-15 DIAGNOSIS — I10 Essential (primary) hypertension: Secondary | ICD-10-CM | POA: Diagnosis not present

## 2013-10-15 DIAGNOSIS — Z23 Encounter for immunization: Secondary | ICD-10-CM | POA: Diagnosis not present

## 2013-10-17 ENCOUNTER — Ambulatory Visit
Admission: RE | Admit: 2013-10-17 | Discharge: 2013-10-17 | Disposition: A | Discharge status: Home / Self Care | Payer: Medicare Other | Source: Ambulatory Visit | Attending: Orthopedic Surgery | Admitting: Orthopedic Surgery

## 2013-10-17 DIAGNOSIS — M542 Cervicalgia: Secondary | ICD-10-CM

## 2013-10-17 DIAGNOSIS — M47812 Spondylosis without myelopathy or radiculopathy, cervical region: Secondary | ICD-10-CM | POA: Diagnosis not present

## 2013-10-24 DIAGNOSIS — N400 Enlarged prostate without lower urinary tract symptoms: Secondary | ICD-10-CM | POA: Diagnosis not present

## 2013-10-24 DIAGNOSIS — E291 Testicular hypofunction: Secondary | ICD-10-CM | POA: Diagnosis not present

## 2013-10-24 DIAGNOSIS — Z8551 Personal history of malignant neoplasm of bladder: Secondary | ICD-10-CM | POA: Diagnosis not present

## 2013-10-27 ENCOUNTER — Encounter (HOSPITAL_BASED_OUTPATIENT_CLINIC_OR_DEPARTMENT_OTHER): Payer: Self-pay | Admitting: *Deleted

## 2013-10-27 DIAGNOSIS — M4802 Spinal stenosis, cervical region: Secondary | ICD-10-CM | POA: Diagnosis not present

## 2013-10-27 NOTE — Progress Notes (Signed)
10/27/13 1224  OBSTRUCTIVE SLEEP APNEA  Have you ever been diagnosed with sleep apnea through a sleep study? No  Do you snore loudly (loud enough to be heard through closed doors)?  1  Do you often feel tired, fatigued, or sleepy during the daytime? 0  Has anyone observed you stop breathing during your sleep? 0  Do you have, or are you being treated for high blood pressure? 1  BMI more than 35 kg/m2? 0  Age over 67 years old? 1  Neck circumference greater than 40 cm/18 inches? 0  Gender: 1  Obstructive Sleep Apnea Score 4  Score 4 or greater  Results sent to PCP

## 2013-10-27 NOTE — Progress Notes (Addendum)
SPOKE W/ PT WIFE, DELORES. PT RESCHEDULED CASE FROM 07-21-2013 TO 11-03-2013.  NPO AFTER MN. ARRIVE AT 0715. NEEDS ISTAT AND EKG. CURRENT CHEST CT IN EPIC AND CHART.  REVIEWED [facility] GUIDELINES, WILL BRING MEDS.

## 2013-11-03 ENCOUNTER — Encounter (HOSPITAL_BASED_OUTPATIENT_CLINIC_OR_DEPARTMENT_OTHER): Admission: RE | Payer: Self-pay | Source: Ambulatory Visit

## 2013-11-03 ENCOUNTER — Ambulatory Visit (HOSPITAL_BASED_OUTPATIENT_CLINIC_OR_DEPARTMENT_OTHER): Payer: Medicare Other | Admitting: Anesthesiology

## 2013-11-03 ENCOUNTER — Ambulatory Visit (HOSPITAL_BASED_OUTPATIENT_CLINIC_OR_DEPARTMENT_OTHER)
Admission: RE | Admit: 2013-11-03 | Discharge: 2013-11-04 | Disposition: A | Discharge status: Home / Self Care | Payer: Medicare Other | Source: Ambulatory Visit | Attending: Urology | Admitting: Urology

## 2013-11-03 ENCOUNTER — Encounter (HOSPITAL_BASED_OUTPATIENT_CLINIC_OR_DEPARTMENT_OTHER): Payer: Self-pay

## 2013-11-03 ENCOUNTER — Encounter (HOSPITAL_BASED_OUTPATIENT_CLINIC_OR_DEPARTMENT_OTHER)
Admission: RE | Disposition: A | Discharge status: Home / Self Care | Payer: Self-pay | Source: Ambulatory Visit | Attending: Urology

## 2013-11-03 ENCOUNTER — Encounter (HOSPITAL_BASED_OUTPATIENT_CLINIC_OR_DEPARTMENT_OTHER): Payer: Medicare Other | Admitting: Anesthesiology

## 2013-11-03 DIAGNOSIS — Z79899 Other long term (current) drug therapy: Secondary | ICD-10-CM | POA: Diagnosis not present

## 2013-11-03 DIAGNOSIS — Z87891 Personal history of nicotine dependence: Secondary | ICD-10-CM | POA: Diagnosis not present

## 2013-11-03 DIAGNOSIS — I10 Essential (primary) hypertension: Secondary | ICD-10-CM | POA: Diagnosis not present

## 2013-11-03 DIAGNOSIS — E785 Hyperlipidemia, unspecified: Secondary | ICD-10-CM | POA: Insufficient documentation

## 2013-11-03 DIAGNOSIS — N320 Bladder-neck obstruction: Secondary | ICD-10-CM | POA: Diagnosis not present

## 2013-11-03 DIAGNOSIS — E291 Testicular hypofunction: Secondary | ICD-10-CM | POA: Diagnosis not present

## 2013-11-03 DIAGNOSIS — Z8551 Personal history of malignant neoplasm of bladder: Secondary | ICD-10-CM | POA: Insufficient documentation

## 2013-11-03 DIAGNOSIS — E119 Type 2 diabetes mellitus without complications: Secondary | ICD-10-CM | POA: Insufficient documentation

## 2013-11-03 DIAGNOSIS — N401 Enlarged prostate with lower urinary tract symptoms: Secondary | ICD-10-CM | POA: Insufficient documentation

## 2013-11-03 DIAGNOSIS — Z85118 Personal history of other malignant neoplasm of bronchus and lung: Secondary | ICD-10-CM | POA: Diagnosis not present

## 2013-11-03 DIAGNOSIS — N138 Other obstructive and reflux uropathy: Secondary | ICD-10-CM | POA: Insufficient documentation

## 2013-11-03 DIAGNOSIS — N400 Enlarged prostate without lower urinary tract symptoms: Secondary | ICD-10-CM | POA: Diagnosis not present

## 2013-11-03 HISTORY — PX: TRANSURETHRAL RESECTION OF PROSTATE: SHX73

## 2013-11-03 HISTORY — DX: Frequency of micturition: R35.0

## 2013-11-03 HISTORY — DX: Solitary pulmonary nodule: R91.1

## 2013-11-03 HISTORY — DX: Personal history of other malignant neoplasm of bronchus and lung: Z85.118

## 2013-11-03 HISTORY — DX: Benign prostatic hyperplasia without lower urinary tract symptoms: N40.0

## 2013-11-03 HISTORY — DX: Type 2 diabetes mellitus without complications: E11.9

## 2013-11-03 HISTORY — DX: Urgency of urination: R39.15

## 2013-11-03 LAB — POCT I-STAT 4, (NA,K, GLUC, HGB,HCT)
Glucose, Bld: 136 mg/dL — ABNORMAL HIGH (ref 70–99)
Hemoglobin: 14.6 g/dL (ref 13.0–17.0)
Potassium: 3.5 mEq/L (ref 3.5–5.1)

## 2013-11-03 LAB — GLUCOSE, CAPILLARY: Glucose-Capillary: 120 mg/dL — ABNORMAL HIGH (ref 70–99)

## 2013-11-03 SURGERY — TRANSURETHRAL RESECTION OF THE PROSTATE WITH GYRUS INSTRUMENTS
Anesthesia: General | Site: Prostate | Wound class: Clean Contaminated

## 2013-11-03 SURGERY — TRANSURETHRAL RESECTION OF THE PROSTATE WITH GYRUS INSTRUMENTS
Anesthesia: General

## 2013-11-03 MED ORDER — FENTANYL CITRATE 0.05 MG/ML IJ SOLN
INTRAMUSCULAR | Status: AC
  Filled 2013-11-03: qty 4

## 2013-11-03 MED ORDER — SODIUM CHLORIDE 0.45 % IV SOLN
INTRAVENOUS | Status: DC
  Administered 2013-11-03 – 2013-11-04 (×2): via INTRAVENOUS
  Filled 2013-11-03: qty 1000

## 2013-11-03 MED ORDER — HYDROCODONE-ACETAMINOPHEN 5-325 MG PO TABS: 1.0000 | ORAL_TABLET | ORAL | Status: DC | PRN

## 2013-11-03 MED ORDER — CIPROFLOXACIN HCL 250 MG PO TABS: 250.0000 mg | ORAL_TABLET | Freq: Two times a day (BID) | ORAL | Status: DC

## 2013-11-03 MED ORDER — PROMETHAZINE HCL 25 MG/ML IJ SOLN
6.2500 mg | INTRAMUSCULAR | Status: DC | PRN
  Filled 2013-11-03: qty 1

## 2013-11-03 MED ORDER — CEFAZOLIN SODIUM 1 G IJ SOLR
INTRAMUSCULAR | Status: AC
  Filled 2013-11-03: qty 10

## 2013-11-03 MED ORDER — MIDAZOLAM HCL 2 MG/2ML IJ SOLN
INTRAMUSCULAR | Status: AC
  Filled 2013-11-03: qty 2

## 2013-11-03 MED ORDER — FENTANYL CITRATE 0.05 MG/ML IJ SOLN
INTRAMUSCULAR | Status: DC | PRN
  Administered 2013-11-03: 25 ug via INTRAVENOUS
  Administered 2013-11-03 (×2): 50 ug via INTRAVENOUS
  Administered 2013-11-03 (×3): 25 ug via INTRAVENOUS

## 2013-11-03 MED ORDER — HYDROCODONE-ACETAMINOPHEN 5-325 MG PO TABS
1.0000 | ORAL_TABLET | ORAL | Status: DC | PRN
  Administered 2013-11-03: 2 via ORAL
  Administered 2013-11-03: 1 via ORAL
  Administered 2013-11-03: 2 via ORAL
  Administered 2013-11-03: 1 via ORAL
  Administered 2013-11-04 (×3): 2 via ORAL
  Filled 2013-11-03: qty 2

## 2013-11-03 MED ORDER — BELLADONNA ALKALOIDS-OPIUM 16.2-60 MG RE SUPP
1.0000 | Freq: Four times a day (QID) | RECTAL | Status: DC | PRN
  Filled 2013-11-03: qty 1

## 2013-11-03 MED ORDER — CIPROFLOXACIN HCL 250 MG PO TABS
ORAL_TABLET | ORAL | Status: AC
  Filled 2013-11-03: qty 1

## 2013-11-03 MED ORDER — HYDROCODONE-ACETAMINOPHEN 5-325 MG PO TABS
ORAL_TABLET | ORAL | Status: AC
  Filled 2013-11-03: qty 1

## 2013-11-03 MED ORDER — HYDROCODONE-ACETAMINOPHEN 5-325 MG PO TABS
ORAL_TABLET | ORAL | Status: AC
  Filled 2013-11-03: qty 2

## 2013-11-03 MED ORDER — BELLADONNA ALKALOIDS-OPIUM 16.2-60 MG RE SUPP
RECTAL | Status: AC
  Filled 2013-11-03: qty 1

## 2013-11-03 MED ORDER — SODIUM CHLORIDE 0.9 % IR SOLN
Status: DC | PRN
  Administered 2013-11-03: 500 mL
  Administered 2013-11-03: 18000 mL

## 2013-11-03 MED ORDER — ONDANSETRON HCL 4 MG/2ML IJ SOLN
4.0000 mg | INTRAMUSCULAR | Status: DC | PRN
  Filled 2013-11-03: qty 2

## 2013-11-03 MED ORDER — LIDOCAINE HCL (CARDIAC) 20 MG/ML IV SOLN
INTRAVENOUS | Status: DC | PRN
  Administered 2013-11-03: 80 mg via INTRAVENOUS

## 2013-11-03 MED ORDER — LACTATED RINGERS IV SOLN
INTRAVENOUS | Status: DC
  Filled 2013-11-03: qty 1000

## 2013-11-03 MED ORDER — PROPOFOL 10 MG/ML IV BOLUS
INTRAVENOUS | Status: DC | PRN
  Administered 2013-11-03: 250 mg via INTRAVENOUS

## 2013-11-03 MED ORDER — SITAGLIPTIN PHOS-METFORMIN HCL 50-500 MG PO TABS
1.0000 | ORAL_TABLET | Freq: Two times a day (BID) | ORAL | Status: DC
  Administered 2013-11-03 (×2): 1 via ORAL

## 2013-11-03 MED ORDER — HYDROXYZINE HCL 25 MG PO TABS
25.0000 mg | ORAL_TABLET | Freq: Three times a day (TID) | ORAL | Status: DC | PRN
  Filled 2013-11-03: qty 1

## 2013-11-03 MED ORDER — CIPROFLOXACIN HCL 250 MG PO TABS
250.0000 mg | ORAL_TABLET | Freq: Two times a day (BID) | ORAL | Status: DC
  Administered 2013-11-03 – 2013-11-04 (×2): 250 mg via ORAL
  Filled 2013-11-03: qty 1

## 2013-11-03 MED ORDER — AMLODIPINE BESYLATE 5 MG PO TABS
5.0000 mg | ORAL_TABLET | Freq: Every day | ORAL | Status: DC
  Administered 2013-11-03: 5 mg via ORAL
  Filled 2013-11-03: qty 1

## 2013-11-03 MED ORDER — MIDAZOLAM HCL 5 MG/5ML IJ SOLN
INTRAMUSCULAR | Status: DC | PRN
  Administered 2013-11-03: 1 mg via INTRAVENOUS

## 2013-11-03 MED ORDER — DOCUSATE SODIUM 100 MG PO CAPS
100.0000 mg | ORAL_CAPSULE | Freq: Two times a day (BID) | ORAL | Status: DC
  Administered 2013-11-03 – 2013-11-04 (×2): 100 mg via ORAL
  Filled 2013-11-03: qty 1

## 2013-11-03 MED ORDER — RAMIPRIL 1.25 MG PO CAPS
1.2500 mg | ORAL_CAPSULE | Freq: Every day | ORAL | Status: DC
  Administered 2013-11-03: 1.25 mg via ORAL
  Filled 2013-11-03: qty 1

## 2013-11-03 MED ORDER — ONDANSETRON HCL 4 MG/2ML IJ SOLN
INTRAMUSCULAR | Status: DC | PRN
  Administered 2013-11-03: 4 mg via INTRAVENOUS

## 2013-11-03 MED ORDER — HYDROMORPHONE HCL PF 1 MG/ML IJ SOLN
0.2500 mg | INTRAMUSCULAR | Status: DC | PRN
  Filled 2013-11-03: qty 1

## 2013-11-03 MED ORDER — BELLADONNA ALKALOIDS-OPIUM 16.2-60 MG RE SUPP
RECTAL | Status: DC | PRN
  Administered 2013-11-03: 1 via RECTAL

## 2013-11-03 MED ORDER — ZOLPIDEM TARTRATE 5 MG PO TABS
5.0000 mg | ORAL_TABLET | Freq: Every evening | ORAL | Status: DC | PRN
  Filled 2013-11-03: qty 1

## 2013-11-03 MED ORDER — LACTATED RINGERS IV SOLN
INTRAVENOUS | Status: DC
  Administered 2013-11-03: 08:00:00 via INTRAVENOUS
  Filled 2013-11-03: qty 1000

## 2013-11-03 MED ORDER — SODIUM CHLORIDE 0.9 % IR SOLN
3000.0000 mL | Status: DC
  Administered 2013-11-03: 3000 mL
  Filled 2013-11-03: qty 3000

## 2013-11-03 MED ORDER — DEXTROSE 5 % IV SOLN
3.0000 g | INTRAVENOUS | Status: AC
  Administered 2013-11-03: 3 g via INTRAVENOUS
  Filled 2013-11-03: qty 3000

## 2013-11-03 SURGICAL SUPPLY — 34 items
BAG DRAIN URO-CYSTO SKYTR STRL (DRAIN) ×2 IMPLANT
BAG DRN ANRFLXCHMBR STRAP LEK (BAG)
BAG DRN UROCATH (DRAIN) ×1
BAG URINE DRAINAGE (UROLOGICAL SUPPLIES) ×1 IMPLANT
BAG URINE LEG 19OZ MD ST LTX (BAG) IMPLANT
CANISTER SUCT LVC 12 LTR MEDI- (MISCELLANEOUS) ×2 IMPLANT
CATH FOLEY 2WAY SLVR  5CC 20FR (CATHETERS)
CATH FOLEY 2WAY SLVR  5CC 22FR (CATHETERS)
CATH FOLEY 2WAY SLVR 30CC 22FR (CATHETERS) ×1 IMPLANT
CATH FOLEY 2WAY SLVR 5CC 20FR (CATHETERS) IMPLANT
CATH FOLEY 2WAY SLVR 5CC 22FR (CATHETERS) IMPLANT
CATH FOLEY 3WAY 30CC 22F (CATHETERS) IMPLANT
CATH HEMA 3WAY 30CC 24FR COUDE (CATHETERS) IMPLANT
CATH HEMA 3WAY 30CC 24FR RND (CATHETERS) IMPLANT
CLOTH BEACON ORANGE TIMEOUT ST (SAFETY) ×2 IMPLANT
DRAPE CAMERA CLOSED 9X96 (DRAPES) ×2 IMPLANT
ELECT BUTTON HF 24-28F 2 30DE (ELECTRODE) ×2 IMPLANT
ELECT LOOP MED HF 24F 12D CBL (CLIP) ×1 IMPLANT
ELECT REM PT RETURN 9FT ADLT (ELECTROSURGICAL)
ELECTRODE REM PT RTRN 9FT ADLT (ELECTROSURGICAL) ×1 IMPLANT
EVACUATOR MICROVAS BLADDER (UROLOGICAL SUPPLIES) ×1 IMPLANT
GLOVE BIO SURGEON STRL SZ7 (GLOVE) ×1 IMPLANT
GLOVE BIO SURGEON STRL SZ8 (GLOVE) ×1 IMPLANT
GLOVE INDICATOR 7.5 STRL GRN (GLOVE) ×1 IMPLANT
GOWN PREVENTION PLUS LG XLONG (DISPOSABLE) ×1 IMPLANT
GOWN STRL REIN XL XLG (GOWN DISPOSABLE) ×3 IMPLANT
HOLDER FOLEY CATH W/STRAP (MISCELLANEOUS) ×1 IMPLANT
IV NS IRRIG 3000ML ARTHROMATIC (IV SOLUTION) ×4 IMPLANT
KIT ASPIRATION TUBING (SET/KITS/TRAYS/PACK) IMPLANT
PACK CYSTOSCOPY (CUSTOM PROCEDURE TRAY) ×2 IMPLANT
PLUG CATH AND CAP STER (CATHETERS) IMPLANT
SET ASPIRATION TUBING (TUBING) ×1 IMPLANT
SYR 30ML LL (SYRINGE) IMPLANT
SYRINGE IRR TOOMEY STRL 70CC (SYRINGE) IMPLANT

## 2013-11-03 NOTE — Anesthesia Postprocedure Evaluation (Signed)
Anesthesia Post Note  Patient: Bobby [name]  Procedure(s) Performed: Procedure(s) (LRB): TRANSURETHRAL RESECTION OF THE PROSTATE WITH GYRUS INSTRUMENTS (N/A)  Anesthesia type: General  Patient location: PACU  Post pain: Pain level controlled  Post assessment: Post-op Vital signs reviewed  Last Vitals:  Filed Vitals:   11/03/13 1115  BP: 128/74  Pulse: 55  Temp: 36.4 C  Resp: 16    Post vital signs: Reviewed  Level of consciousness: sedated  Complications: No apparent anesthesia complications

## 2013-11-03 NOTE — Anesthesia Procedure Notes (Signed)
Procedure Name: LMA Insertion Date/Time: 11/03/2013 8:49 AM Performed by: Maris Berger T Pre-anesthesia Checklist: Patient identified, Emergency Drugs available, Suction available and Patient being monitored Patient Re-evaluated:Patient Re-evaluated prior to inductionOxygen Delivery Method: Circle System Utilized Preoxygenation: Pre-oxygenation with 100% oxygen Intubation Type: IV induction Ventilation: Mask ventilation without difficulty LMA: LMA inserted LMA Size: 5.0 Number of attempts: 1 Airway Equipment and Method: bite block Placement Confirmation: positive ETCO2 Dental Injury: Teeth and Oropharynx as per pre-operative assessment

## 2013-11-03 NOTE — Op Note (Signed)
Preoperative diagnosis: 1. Bladder outlet obstruction secondary to BPH  Postoperative diagnosis:  1. Bladder outlet obstruction secondary to BPH  Procedure:  1. Cystoscopy 2. Transurethral resection of the prostate  Surgeon: Bertram Millard. Rozelia Catapano, M.D.  Anesthesia: General  Complications: None  Drain: Foley catheter  EBL: Minimal  Specimens: 1. Prostate chips  Disposition of specimens: Pathology  Indication: Bobby [name] is a patient with bladder outlet obstruction secondary to benign prostatic hyperplasia. After reviewing the management options for treatment, he elected to proceed with the above surgical procedure(s). We have discussed the potential benefits and risks of the procedure, side effects of the proposed treatment, the likelihood of the patient achieving the goals of the procedure, and any potential problems that might occur during the procedure or recuperation. Informed consent has been obtained.  Description of procedure:  The patient was identified in the holding area.He received preoperative antibiotics. He was then taken to the operating room. General anesthetic was administered.  The patient was then placed in the dorsal lithotomy position, prepped and draped in the usual sterile fashion. Timeout was then performed.  A resectoscope sheath was placed using the obturator, and the resectoscope, loop and telescope were placed.  The bladder was then systematically examined in its entirety. There was no evidence of  tumors, stones, or other mucosal pathology.  The ureteral orifices were identified and marked so as to be avoided during the procedure.  The prostate adenoma was then resected utilizing loop cautery resection with the bipolar cutting loop.  The prostate adenoma from the bladder neck back to the verumontanum was resected beginning at the six o'clock position and then extended to include the right and left lobes of the prostate and anterior prostate,  respectively. There was a significant median lobe, which was resected first. Care was taken not to resect distal to the verumontanum.  Hemostasis was then achieved with the button and the bladder was emptied and reinspected with no significant bleeding noted at the end of the procedure.  Chips were irrigated from the bladder and sent to pathology.  A 3 way catheter was then placed into the bladder and placed on continuous bladder irrigation.  The patient appeared to tolerate the procedure well and without complications.  The patient was able to be awakened and transferred to the recovery unit in satisfactory condition.

## 2013-11-03 NOTE — H&P (Signed)
Urology History and Physical Exam  CC: Large prostate  HPI: 67 year old male presents for TUR-P for voiding symptoms as well as an increasing PVR urine volume. His history is as follows:  This man originally came in 07/10/2011 for a second opinion. He had a prior urologic history of bladder cancer, having had a TURBT by Dr. Tobie Lords in [facility] in approximately 2000. He has had negative followup since that time. He had been told in the past 5 years before presentation that he had a large prostate, and it was suggested that he have a TURP. He has done moderately well without having had surgical treatment of his prostate  Post void ultrasound measurement at his first office visit here was 330 cc. Because of his symptoms and residual volume, he was sent home on samples of Rapaflo.  At his visit in November, 2012 he underwent cystoscopy because of voiding symptoms and a high residual urine volume. This revealed a significant median lobe with prostatic obstruction. He was placed on Jalyn. He only took this for a couple of months and stopped.  He was also started on Axiron for his hypogonadism. He does not take this on a regular basis.   PMH: Past Medical History  Diagnosis Date  . Hypertension   . Hyperlipidemia   . History of lung cancer     STAGE 1A NON-SMALL CELL LUNG CARCINOMA ---  04-10-2009   S/P RIGHT UPPER LOBECTOMY , NO CHEMORADIATION--  NO RECURRENCE  (ONCOLOGIST-- DR [facility])  . Type 2 diabetes mellitus   . BPH (benign prostatic hypertrophy)   . Pulmonary nodule seen on imaging study     STABLE LEFT LOWER LOBE NODULE  PER CHEST CT--  MONITORED BY DR [facility]  . Frequency of urination   . Urgency of urination     PSH: Past Surgical History  Procedure Laterality Date  . Carpal tunnel release Right   . Knee arthroscopy w/ meniscectomy Left 10-31-2004  . Left shoulder arthroscopy/ acromioplasty/ decompression/ synovectomy  11-20-2007  . Video assisted thoracoscopy  (vats)/ lobectomy Right 04-10-2009  DR BURNEY    RIGHT UPPER LOBECTOMY AND NODE DISSECTION  . Cardiovascular stress test  04-08-2009    NORMAL NUCLEAR STUDY/ NO ISCHEMIA/ EF 60%    Allergies: No Known Allergies  Medications: No prescriptions prior to admission     Social History: History   Social History  . Marital Status: Married    Spouse Name: N/A    Number of Children: N/A  . Years of Education: N/A   Occupational History  . Not on file.   Social History Main Topics  . Smoking status: Former Smoker -- 20 years    Types: Cigarettes    Quit date: 05/28/2000  . Smokeless tobacco: Never Used  . Alcohol Use: 0.6 oz/week    1 Cans of beer per week  . Drug Use: No  . Sexual Activity: Not on file   Other Topics Concern  . Not on file   Social History Narrative  . No narrative on file    Family History: History reviewed. No pertinent family history.  Review of Systems: Positive: Decreased FOS, intermittency, incomplete emptying, nocturia, frequency Negative:  A further 10 point review of systems was negative except what is listed in the HPI.  Physical Exam: @VITALS2 @ General: No acute distress.  Awake. Head:  Normocephalic.  Atraumatic. ENT:  EOMI.  Mucous membranes moist Neck:  Supple.  No lymphadenopathy. CV:  S1 present. S2 present.  Regular rate. Pulmonary: Equal effort bilaterally.  Clear to auscultation bilaterally. Abdomen: Soft.  Non tender to palpation. Skin:  Normal turgor.  No visible rash. Extremity: No gross deformity of bilateral upper extremities.  No gross deformity of    bilateral lower extremities. Neurologic: Alert. Appropriate mood.    Studies:  No results found for this basename: HGB, WBC, PLT,  in the last 72 hours  No results found for this basename: NA, K, CL, CO2, BUN, CREATININE, CALCIUM, MAGNESIUM, GFRNONAA, GFRAA,  in the last 72 hours   No results found for this basename: PT, INR, APTT,  in the last 72 hours   No  components found with this basename: ABG,     Assessment:  BPH   Plan: Gyrus TUR-P

## 2013-11-03 NOTE — Transfer of Care (Signed)
Immediate Anesthesia Transfer of Care Note  Patient: Bobby [name]  Procedure(s) Performed: Procedure(s): TRANSURETHRAL RESECTION OF THE PROSTATE WITH GYRUS INSTRUMENTS (N/A)  Patient Location: PACU  Anesthesia Type:General  Level of Consciousness: awake and oriented  Airway & Oxygen Therapy: Patient Spontanous Breathing and Patient connected to nasal cannula oxygen  Post-op Assessment: Report given to PACU RN  Post vital signs: Reviewed and stable  Complications: No apparent anesthesia complications

## 2013-11-03 NOTE — Anesthesia Preprocedure Evaluation (Signed)
Anesthesia Evaluation  Patient identified by MRN, date of birth, ID band Patient awake    Reviewed: Allergy & Precautions, H&P , NPO status , Patient's Chart, lab work & pertinent test results  Airway Mallampati: II TM Distance: >3 FB Neck ROM: Full    Dental  (+) Partial Upper, Missing, Poor Dentition, Dental Advisory Given and Caps   Pulmonary former smoker,  Hx of Lung CA; S/P right upper lobectomy breath sounds clear to auscultation  Pulmonary exam normal       Cardiovascular hypertension, Pt. on medications Rhythm:Regular Rate:Normal     Neuro/Psych negative neurological ROS  negative psych ROS   GI/Hepatic negative GI ROS, Neg liver ROS,   Endo/Other  diabetes, Type 2, Oral Hypoglycemic AgentsMorbid obesity  Renal/GU negative Renal ROS  negative genitourinary   Musculoskeletal negative musculoskeletal ROS (+)   Abdominal   Peds  Hematology negative hematology ROS (+)   Anesthesia Other Findings   Reproductive/Obstetrics                           Anesthesia Physical Anesthesia Plan  ASA: III  Anesthesia Plan: General   Post-op Pain Management:    Induction: Intravenous  Airway Management Planned: LMA  Additional Equipment:   Intra-op Plan:   Post-operative Plan: Extubation in OR  Informed Consent: I have reviewed the patients History and Physical, chart, labs and discussed the procedure including the risks, benefits and alternatives for the proposed anesthesia with the patient or authorized representative who has indicated his/her understanding and acceptance.   Dental advisory given  Plan Discussed with: CRNA  Anesthesia Plan Comments:         Anesthesia Quick Evaluation

## 2013-11-04 ENCOUNTER — Encounter (HOSPITAL_BASED_OUTPATIENT_CLINIC_OR_DEPARTMENT_OTHER): Payer: Self-pay | Admitting: Urology

## 2013-11-04 LAB — GLUCOSE, CAPILLARY: Glucose-Capillary: 147 mg/dL — ABNORMAL HIGH (ref 70–99)

## 2013-11-04 MED ORDER — HYDROCODONE-ACETAMINOPHEN 5-325 MG PO TABS
ORAL_TABLET | ORAL | Status: AC
  Filled 2013-11-04: qty 2

## 2013-11-04 MED ORDER — CIPROFLOXACIN HCL 500 MG PO TABS
ORAL_TABLET | ORAL | Status: AC
  Filled 2013-11-04: qty 1

## 2013-11-04 NOTE — Progress Notes (Signed)
Pt's urine in foley bag pink in color.  Foley catheter removed per order.  String of bottles at pt's bedside.  Pt instructed on saving urine in bottles.  Will reinforce.

## 2013-11-05 ENCOUNTER — Ambulatory Visit (HOSPITAL_BASED_OUTPATIENT_CLINIC_OR_DEPARTMENT_OTHER): Admission: RE | Admit: 2013-11-05 | Payer: Medicare Other | Source: Ambulatory Visit | Admitting: Urology

## 2013-11-07 DIAGNOSIS — N400 Enlarged prostate without lower urinary tract symptoms: Secondary | ICD-10-CM | POA: Diagnosis not present

## 2013-11-08 ENCOUNTER — Emergency Department (HOSPITAL_COMMUNITY)
Admission: EM | Admit: 2013-11-08 | Discharge: 2013-11-08 | Disposition: A | Discharge status: Home / Self Care | Payer: Medicare Other | Attending: Emergency Medicine | Admitting: Emergency Medicine

## 2013-11-08 ENCOUNTER — Encounter (HOSPITAL_COMMUNITY): Payer: Self-pay | Admitting: Emergency Medicine

## 2013-11-08 DIAGNOSIS — Z79899 Other long term (current) drug therapy: Secondary | ICD-10-CM | POA: Diagnosis not present

## 2013-11-08 DIAGNOSIS — Z87891 Personal history of nicotine dependence: Secondary | ICD-10-CM | POA: Insufficient documentation

## 2013-11-08 DIAGNOSIS — E119 Type 2 diabetes mellitus without complications: Secondary | ICD-10-CM | POA: Diagnosis not present

## 2013-11-08 DIAGNOSIS — T83091A Other mechanical complication of indwelling urethral catheter, initial encounter: Secondary | ICD-10-CM | POA: Diagnosis not present

## 2013-11-08 DIAGNOSIS — Z9889 Other specified postprocedural states: Secondary | ICD-10-CM | POA: Diagnosis not present

## 2013-11-08 DIAGNOSIS — Z87448 Personal history of other diseases of urinary system: Secondary | ICD-10-CM | POA: Diagnosis not present

## 2013-11-08 DIAGNOSIS — I10 Essential (primary) hypertension: Secondary | ICD-10-CM | POA: Diagnosis not present

## 2013-11-08 DIAGNOSIS — Z792 Long term (current) use of antibiotics: Secondary | ICD-10-CM | POA: Insufficient documentation

## 2013-11-08 DIAGNOSIS — Z85118 Personal history of other malignant neoplasm of bronchus and lung: Secondary | ICD-10-CM | POA: Insufficient documentation

## 2013-11-08 DIAGNOSIS — Y846 Urinary catheterization as the cause of abnormal reaction of the patient, or of later complication, without mention of misadventure at the time of the procedure: Secondary | ICD-10-CM | POA: Insufficient documentation

## 2013-11-08 LAB — CBC WITH DIFFERENTIAL/PLATELET
Basophils Relative: 0 % (ref 0–1)
Eosinophils Absolute: 0.2 10*3/uL (ref 0.0–0.7)
Eosinophils Relative: 2 % (ref 0–5)
Hemoglobin: 12.9 g/dL — ABNORMAL LOW (ref 13.0–17.0)
Lymphs Abs: 1.9 10*3/uL (ref 0.7–4.0)
MCH: 26.3 pg (ref 26.0–34.0)
MCHC: 32.6 g/dL (ref 30.0–36.0)
MCV: 80.7 fL (ref 78.0–100.0)
Neutrophils Relative %: 75 % (ref 43–77)
Platelets: 295 10*3/uL (ref 150–400)
RBC: 4.91 MIL/uL (ref 4.22–5.81)
RDW: 15.3 % (ref 11.5–15.5)

## 2013-11-08 LAB — BASIC METABOLIC PANEL
Calcium: 9 mg/dL (ref 8.4–10.5)
Creatinine, Ser: 0.98 mg/dL (ref 0.50–1.35)
GFR calc Af Amer: >90 mL/min (ref >90)
GFR calc non Af Amer: 83 mL/min — ABNORMAL LOW (ref >90)
Glucose, Bld: 142 mg/dL — ABNORMAL HIGH (ref 70–99)
Potassium: 3.8 mEq/L (ref 3.5–5.1)
Sodium: 137 mEq/L (ref 135–145)

## 2013-11-08 LAB — URINALYSIS, ROUTINE W REFLEX MICROSCOPIC
Bilirubin Urine: NEGATIVE
Ketones, ur: NEGATIVE mg/dL
Nitrite: NEGATIVE
Protein, ur: >300 mg/dL — AB
Urobilinogen, UA: 0.2 mg/dL (ref 0.0–1.0)
pH: 6 (ref 5.0–8.0)

## 2013-11-08 LAB — URINE MICROSCOPIC-ADD ON

## 2013-11-08 MED ORDER — CEPHALEXIN 500 MG PO CAPS: 500.0000 mg | ORAL_CAPSULE | Freq: Four times a day (QID) | ORAL | Status: DC

## 2013-11-08 NOTE — ED Notes (Signed)
Bladder scanned pt 30 ml in bladder

## 2013-11-08 NOTE — ED Notes (Signed)
Urine recollected and sent . Last sample was drained into bag from sample cup.

## 2013-11-08 NOTE — ED Notes (Signed)
Pt had turp surgery on Monday and had a catheter placed. Pt had complications and had another one placed on Friday. Pt to have catheter taken out on Monday. Pt emptied catheter bag 2 x times this am. Last empty was at 10 am. Now pt has not had any urine from catheter since 10 am.

## 2013-11-08 NOTE — ED Notes (Signed)
Nurse is currently irrigating foley. Will get urine sample once that is complete

## 2013-11-08 NOTE — ED Provider Notes (Signed)
CSN: [id number]     Arrival date & time 11/08/13  1632 History   First MD Initiated Contact with Patient 11/08/13 1709     No chief complaint on file.  (Consider location/radiation/quality/duration/timing/severity/associated sxs/prior Treatment) HPI Comments: 67 year old male presents with acute urinary retention. He states he had a TURP 5 days ago. At the time he had a Foley catheter placed in for followup yesterday is unable to urinate she fully was then replaced. Since then the catheter has been working. This morning he had normal urination and about 5 hours ago he noticed that the urination stopped. He actually seemed to urinate around his Foley into his pants. Denies pain. Denies fevers or chills. Denies any back pain.   Past Medical History  Diagnosis Date  . Hypertension   . Hyperlipidemia   . History of lung cancer     STAGE 1A NON-SMALL CELL LUNG CARCINOMA ---  04-10-2009   S/P RIGHT UPPER LOBECTOMY , NO CHEMORADIATION--  NO RECURRENCE  (ONCOLOGIST-- DR [facility])  . Type 2 diabetes mellitus   . BPH (benign prostatic hypertrophy)   . Pulmonary nodule seen on imaging study     STABLE LEFT LOWER LOBE NODULE  PER CHEST CT--  MONITORED BY DR [facility]  . Frequency of urination   . Urgency of urination    Past Surgical History  Procedure Laterality Date  . Carpal tunnel release Right   . Knee arthroscopy w/ meniscectomy Left 10-31-2004  . Left shoulder arthroscopy/ acromioplasty/ decompression/ synovectomy  11-20-2007  . Video assisted thoracoscopy (vats)/ lobectomy Right 04-10-2009  DR BURNEY    RIGHT UPPER LOBECTOMY AND NODE DISSECTION  . Cardiovascular stress test  04-08-2009    NORMAL NUCLEAR STUDY/ NO ISCHEMIA/ EF 60%  . Transurethral resection of prostate N/A 11/03/2013    Procedure: TRANSURETHRAL RESECTION OF THE PROSTATE WITH GYRUS INSTRUMENTS;  Surgeon: Marcine Matar, MD;  Location: [facility];  Service: Urology;  Laterality: N/A;   History reviewed. No  pertinent family history. History  Substance Use Topics  . Smoking status: Former Smoker -- 20 years    Types: Cigarettes    Quit date: 05/28/2000  . Smokeless tobacco: Never Used  . Alcohol Use: 0.6 oz/week    1 Cans of beer per week    Review of Systems  Constitutional: Negative for fever and chills.  Gastrointestinal: Negative for vomiting and abdominal pain.  Genitourinary: Positive for difficulty urinating.  Musculoskeletal: Negative for back pain.  All other systems reviewed and are negative.    Allergies  Review of patient's allergies indicates no known allergies.  Home Medications   Current Outpatient Rx  Name  Route  Sig  Dispense  Refill  . amLODipine-benazepril (LOTREL) 5-20 MG per capsule   Oral   Take 1 capsule by mouth daily.         Marland Kitchen b complex vitamins tablet   Oral   Take 1 tablet by mouth daily.         . ciprofloxacin (CIPRO) 250 MG tablet   Oral   Take 1 tablet (250 mg total) by mouth 2 (two) times daily.   10 tablet   0   . HYDROcodone-acetaminophen (NORCO) 5-325 MG per tablet   Oral   Take 1 tablet by mouth every 4 (four) hours as needed for moderate pain.   20 tablet   0   . hydrOXYzine (ATARAX) 25 MG tablet   Oral   Take 25 mg by mouth as needed. For itching         .  ramipril (ALTACE) 1.25 MG capsule   Oral   Take 1.25 mg by mouth Daily.         Marland Kitchen SIMCOR 500-20 MG 24 hr tablet   Oral   Take 1 tablet by mouth At bedtime. Simcor 500-20 mg. Take 1 at bedtime         . sitaGLIPtan-metformin (JANUMET) 50-500 MG per tablet   Oral   Take 1 tablet by mouth 2 (two) times daily with a meal.           BP 158/87  Pulse 104  Resp 22  SpO2 98% Physical Exam  Nursing note and vitals reviewed. Constitutional: He is oriented to person, place, and time. He appears well-developed and well-nourished.  HENT:  Head: Normocephalic and atraumatic.  Right Ear: External ear normal.  Left Ear: External ear normal.  Nose: Nose  normal.  Eyes: Right eye exhibits no discharge. Left eye exhibits no discharge.  Neck: Neck supple.  Cardiovascular: Normal rate, regular rhythm, normal heart sounds and intact distal pulses.   Pulmonary/Chest: Effort normal.  Abdominal: Soft. There is tenderness in the suprapubic area.  Genitourinary:  Foley catheter in place. Urine noted at tip of penis around catheter  Neurological: He is alert and oriented to person, place, and time.  Skin: Skin is warm and dry.    ED Course  Procedures (including critical care time) Labs Review Labs Reviewed  URINALYSIS, ROUTINE W REFLEX MICROSCOPIC - Abnormal; Notable for the following:    Color, Urine AMBER (*)    APPearance CLOUDY (*)    Glucose, UA 100 (*)    Hgb urine dipstick LARGE (*)    Protein, ur >300 (*)    Leukocytes, UA SMALL (*)    All other components within normal limits  CBC WITH DIFFERENTIAL - Abnormal; Notable for the following:    WBC 12.2 (*)    Hemoglobin 12.9 (*)    Neutro Abs 9.1 (*)    All other components within normal limits  BASIC METABOLIC PANEL - Abnormal; Notable for the following:    Glucose, Bld 142 (*)    GFR calc non Af Amer 83 (*)    All other components within normal limits  URINE MICROSCOPIC-ADD ON   Imaging Review No results found.  EKG Interpretation   None       MDM   1. Obstructed Foley catheter, initial encounter    Appears well, symptoms completely resolved after flushing foley, 500cc out. Repeat abd exam shows no tenderness. Mild blood after irrigation, but otherwise clear. UA has some leukocytes, given his obstruction and indwelling foley will cover with keflex and refer back to his urologist.    Audree Camel, MD 11/09/13 [phone]

## 2013-11-10 DIAGNOSIS — N400 Enlarged prostate without lower urinary tract symptoms: Secondary | ICD-10-CM | POA: Diagnosis not present

## 2013-11-12 DIAGNOSIS — N400 Enlarged prostate without lower urinary tract symptoms: Secondary | ICD-10-CM | POA: Diagnosis not present

## 2013-12-11 DIAGNOSIS — H2510 Age-related nuclear cataract, unspecified eye: Secondary | ICD-10-CM | POA: Diagnosis not present

## 2013-12-11 DIAGNOSIS — H25099 Other age-related incipient cataract, unspecified eye: Secondary | ICD-10-CM | POA: Diagnosis not present

## 2013-12-17 DIAGNOSIS — N400 Enlarged prostate without lower urinary tract symptoms: Secondary | ICD-10-CM | POA: Diagnosis not present

## 2013-12-23 DIAGNOSIS — J069 Acute upper respiratory infection, unspecified: Secondary | ICD-10-CM | POA: Diagnosis not present

## 2014-02-11 DIAGNOSIS — E119 Type 2 diabetes mellitus without complications: Secondary | ICD-10-CM | POA: Diagnosis not present

## 2014-02-11 DIAGNOSIS — I10 Essential (primary) hypertension: Secondary | ICD-10-CM | POA: Diagnosis not present

## 2014-02-11 DIAGNOSIS — E782 Mixed hyperlipidemia: Secondary | ICD-10-CM | POA: Diagnosis not present

## 2014-07-26 DIAGNOSIS — N400 Enlarged prostate without lower urinary tract symptoms: Secondary | ICD-10-CM | POA: Diagnosis not present

## 2014-07-26 DIAGNOSIS — M2550 Pain in unspecified joint: Secondary | ICD-10-CM | POA: Diagnosis not present

## 2014-07-26 DIAGNOSIS — M109 Gout, unspecified: Secondary | ICD-10-CM | POA: Diagnosis not present

## 2014-07-26 DIAGNOSIS — E119 Type 2 diabetes mellitus without complications: Secondary | ICD-10-CM | POA: Diagnosis not present

## 2014-08-03 DIAGNOSIS — E119 Type 2 diabetes mellitus without complications: Secondary | ICD-10-CM | POA: Diagnosis not present

## 2014-08-03 DIAGNOSIS — E663 Overweight: Secondary | ICD-10-CM | POA: Diagnosis not present

## 2014-08-03 DIAGNOSIS — Z0000 Encounter for general adult medical examination without abnormal findings: Secondary | ICD-10-CM | POA: Diagnosis not present

## 2014-08-03 DIAGNOSIS — M2559 Pain in other specified joint: Secondary | ICD-10-CM | POA: Diagnosis not present

## 2014-08-03 DIAGNOSIS — I10 Essential (primary) hypertension: Secondary | ICD-10-CM | POA: Diagnosis not present

## 2014-08-03 DIAGNOSIS — Z1331 Encounter for screening for depression: Secondary | ICD-10-CM | POA: Diagnosis not present

## 2014-08-03 DIAGNOSIS — Z23 Encounter for immunization: Secondary | ICD-10-CM | POA: Diagnosis not present

## 2014-08-03 DIAGNOSIS — E782 Mixed hyperlipidemia: Secondary | ICD-10-CM | POA: Diagnosis not present

## 2014-08-24 ENCOUNTER — Other Ambulatory Visit (HOSPITAL_BASED_OUTPATIENT_CLINIC_OR_DEPARTMENT_OTHER): Payer: Medicare Other

## 2014-08-24 ENCOUNTER — Ambulatory Visit (HOSPITAL_COMMUNITY)
Admission: RE | Admit: 2014-08-24 | Discharge: 2014-08-24 | Disposition: A | Discharge status: Home / Self Care | Payer: Medicare Other | Source: Ambulatory Visit | Attending: Internal Medicine | Admitting: Internal Medicine

## 2014-08-24 DIAGNOSIS — C3490 Malignant neoplasm of unspecified part of unspecified bronchus or lung: Secondary | ICD-10-CM | POA: Diagnosis not present

## 2014-08-24 DIAGNOSIS — Z9889 Other specified postprocedural states: Secondary | ICD-10-CM | POA: Insufficient documentation

## 2014-08-24 DIAGNOSIS — K7689 Other specified diseases of liver: Secondary | ICD-10-CM | POA: Diagnosis not present

## 2014-08-24 DIAGNOSIS — R918 Other nonspecific abnormal finding of lung field: Secondary | ICD-10-CM | POA: Diagnosis not present

## 2014-08-24 LAB — CBC WITH DIFFERENTIAL/PLATELET
BASO%: 0.7 % (ref 0.0–2.0)
Basophils Absolute: 0 10*3/uL (ref 0.0–0.1)
EOS%: 1.5 % (ref 0.0–7.0)
Eosinophils Absolute: 0.1 10*3/uL (ref 0.0–0.5)
HCT: 41.7 % (ref 38.4–49.9)
HGB: 13.2 g/dL (ref 13.0–17.1)
LYMPH#: 1.8 10*3/uL (ref 0.9–3.3)
LYMPH%: 25.8 % (ref 14.0–49.0)
MCH: 25.7 pg — ABNORMAL LOW (ref 27.2–33.4)
MCHC: 31.7 g/dL — ABNORMAL LOW (ref 32.0–36.0)
MCV: 81 fL (ref 79.3–98.0)
MONO#: 0.5 10*3/uL (ref 0.1–0.9)
MONO%: 7.5 % (ref 0.0–14.0)
NEUT#: 4.5 10*3/uL (ref 1.5–6.5)
NEUT%: 64.5 % (ref 39.0–75.0)
PLATELETS: 238 10*3/uL (ref 140–400)
RBC: 5.15 10*6/uL (ref 4.20–5.82)
RDW: 15.7 % — ABNORMAL HIGH (ref 11.0–14.6)
WBC: 7 10*3/uL (ref 4.0–10.3)

## 2014-08-24 LAB — COMPREHENSIVE METABOLIC PANEL (CC13)
ALBUMIN: 3.6 g/dL (ref 3.5–5.0)
ALT: 32 U/L (ref 0–55)
ANION GAP: 11 meq/L (ref 3–11)
AST: 24 U/L (ref 5–34)
Alkaline Phosphatase: 54 U/L (ref 40–150)
BUN: 15 mg/dL (ref 7.0–26.0)
CALCIUM: 9 mg/dL (ref 8.4–10.4)
CHLORIDE: 109 meq/L (ref 98–109)
CO2: 21 meq/L — AB (ref 22–29)
Creatinine: 0.8 mg/dL (ref 0.7–1.3)
GLUCOSE: 126 mg/dL (ref 70–140)
Potassium: 3.8 mEq/L (ref 3.5–5.1)
SODIUM: 142 meq/L (ref 136–145)
TOTAL PROTEIN: 6.8 g/dL (ref 6.4–8.3)
Total Bilirubin: 0.43 mg/dL (ref 0.20–1.20)

## 2014-08-26 ENCOUNTER — Encounter: Payer: Self-pay | Admitting: Internal Medicine

## 2014-08-26 ENCOUNTER — Ambulatory Visit (HOSPITAL_BASED_OUTPATIENT_CLINIC_OR_DEPARTMENT_OTHER): Payer: Medicare Other | Admitting: Internal Medicine

## 2014-08-26 ENCOUNTER — Telehealth: Payer: Self-pay | Admitting: Internal Medicine

## 2014-08-26 VITALS — BP 142/76 | HR 78 | Temp 98.7°F | Resp 20 | Ht 70.0 in | Wt 243.9 lb

## 2014-08-26 DIAGNOSIS — Z85118 Personal history of other malignant neoplasm of bronchus and lung: Secondary | ICD-10-CM

## 2014-08-26 DIAGNOSIS — C3490 Malignant neoplasm of unspecified part of unspecified bronchus or lung: Secondary | ICD-10-CM

## 2014-08-26 NOTE — Telephone Encounter (Signed)
Pt confirmed labs/ov per 09/23 POF, gave pt AVS....KJ

## 2014-08-26 NOTE — Progress Notes (Signed)
[place] [phone]   [phone]  OFFICE PROGRESS NOTE  [name], MD 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   PRIOR THERAPY: Status post right upper lobectomy on Apr 30, 2009 under the care of Dr. Arlyce Dice.   CURRENT THERAPY: Observation.  INTERVAL HISTORY: Bobby [name] 68 y.o. male returns to the clinic today for annual followup visit. He has been observation for the last 5 years. The patient is feeling fine today with no specific complaints. He denied having any significant weight loss or night sweats. He has no chest pain, shortness of breath, cough or hemoptysis. The patient has no nausea or vomiting. He has repeat CT scan of the chest performed recently and he is here for evaluation and discussion of his lab and her scan results.  MEDICAL HISTORY: Past Medical History  Diagnosis Date  . Hypertension   . Hyperlipidemia   . History of lung cancer     STAGE 1A NON-SMALL CELL LUNG CARCINOMA ---  04-10-2009   S/P RIGHT UPPER LOBECTOMY , NO CHEMORADIATION--  NO RECURRENCE  (ONCOLOGIST-- DR [facility])  . Type 2 diabetes mellitus   . BPH (benign prostatic hypertrophy)   . Pulmonary nodule seen on imaging study     STABLE LEFT LOWER LOBE NODULE  PER CHEST CT--  MONITORED BY DR [facility]  . Frequency of urination   . Urgency of urination     ALLERGIES:  has No Known Allergies.  MEDICATIONS:  Current Outpatient Prescriptions  Medication Sig Dispense Refill  . amLODipine-benazepril (LOTREL) 5-20 MG per capsule Take 1 capsule by mouth every morning.       Marland Kitchen b complex vitamins tablet Take 1 tablet by mouth daily.      . cholecalciferol (VITAMIN D) 1000 UNITS tablet Take 1,000 Units by mouth daily.      Marland Kitchen escitalopram (LEXAPRO) 20 MG tablet       . prazosin (MINIPRESS) 2 MG capsule        . ramipril (ALTACE) 1.25 MG capsule Take 1.25 mg by mouth every morning.       Marland Kitchen SIMCOR 500-20 MG 24 hr tablet Take 1 tablet by mouth At bedtime.       . sitaGLIPtan-metformin (JANUMET) 50-500 MG per tablet Take 1 tablet by mouth 2 (two) times daily with a meal.        No current facility-administered medications for this visit.    SURGICAL HISTORY:  Past Surgical History  Procedure Laterality Date  . Carpal tunnel release Right   . Knee arthroscopy w/ meniscectomy Left 10-31-2004  . Left shoulder arthroscopy/ acromioplasty/ decompression/ synovectomy  11-20-2007  . Video assisted thoracoscopy (vats)/ lobectomy Right 04-10-2009  DR BURNEY    RIGHT UPPER LOBECTOMY AND NODE DISSECTION  . Cardiovascular stress test  04-08-2009    NORMAL NUCLEAR STUDY/ NO ISCHEMIA/ EF 60%  . Transurethral resection of prostate N/A 11/03/2013    Procedure: TRANSURETHRAL RESECTION OF THE PROSTATE WITH GYRUS INSTRUMENTS;  Surgeon: Franchot Gallo, MD;  Location: [facility];  Service: Urology;  Laterality: N/A;    REVIEW OF SYSTEMS:  A comprehensive review of systems was negative.   PHYSICAL EXAMINATION: General appearance: alert, cooperative and no distress Head: Normocephalic, without obvious abnormality, atraumatic Neck: no adenopathy, supple, symmetrical, trachea midline and thyroid not enlarged, symmetric, no tenderness/mass/nodules Lymph  nodes: Cervical, supraclavicular, and axillary nodes normal. Resp: clear to auscultation bilaterally and normal percussion bilaterally Cardio: regular rate and rhythm, S1, S2 normal, no murmur, click, rub or gallop GI: soft, non-tender; bowel sounds normal; no masses,  no organomegaly Extremities: extremities normal, atraumatic, no cyanosis or edema  ECOG PERFORMANCE STATUS: 1 - Symptomatic but completely ambulatory  Blood pressure 142/76, pulse 78, temperature 98.7 F (37.1 C), temperature source Oral, resp. rate 20, height 5\' 10"  (1.778 m),  weight 243 lb 14.4 oz (110.632 kg).  LABORATORY DATA: Lab Results  Component Value Date   WBC 7.0 08/24/2014   HGB 13.2 08/24/2014   HCT 41.7 08/24/2014   MCV 81.0 08/24/2014   PLT 238 08/24/2014      Chemistry      Component Value Date/Time   NA 142 08/24/2014 0940   NA 137 11/08/2013 1745   NA 137 06/26/2012 0941   K 3.8 08/24/2014 0940   K 3.8 11/08/2013 1745   K 3.9 06/26/2012 0941   CL 103 11/08/2013 1745   CL 108* 02/11/2013 0811   CL 102 06/26/2012 0941   CO2 21* 08/24/2014 0940   CO2 23 11/08/2013 1745   CO2 27 06/26/2012 0941   BUN 15.0 08/24/2014 0940   BUN 16 11/08/2013 1745   BUN 16 06/26/2012 0941   CREATININE 0.8 08/24/2014 0940   CREATININE 0.98 11/08/2013 1745   CREATININE 1.2 06/26/2012 0941      Component Value Date/Time   CALCIUM 9.0 08/24/2014 0940   CALCIUM 9.0 11/08/2013 1745   CALCIUM 9.0 06/26/2012 0941   ALKPHOS 54 08/24/2014 0940   ALKPHOS 43 06/26/2012 0941   ALKPHOS 47 06/03/2010 0955   AST 24 08/24/2014 0940   AST 20 06/26/2012 0941   AST 28 06/03/2010 0955   ALT 32 08/24/2014 0940   ALT 33 06/26/2012 0941   ALT 27 06/03/2010 0955   BILITOT 0.43 08/24/2014 0940   BILITOT 0.60 06/26/2012 0941   BILITOT 0.6 06/03/2010 0955       RADIOGRAPHIC STUDIES: Ct Chest Wo Contrast  08/24/2014   CLINICAL DATA:  Lung cancer.  EXAM: CT CHEST WITHOUT CONTRAST  TECHNIQUE: Multidetector CT imaging of the chest was performed following the standard protocol without IV contrast.  COMPARISON:  08/19/2013.  10/08/2012.  PET-CT from 07/02/2012.  FINDINGS: Soft tissue / Mediastinum: No axillary lymphadenopathy. No mediastinal or hilar lymphadenopathy. Surgical clips are noted in the right hilum. Heart size is normal. No pericardial or pleural effusion.  Lungs / Pleura: Stable appearance of postsurgical scarring in the right mid lung. The previously described 1.3 cm left lower lobe nodule (image 34 series 5 today) is unchanged and has been noted previously to be stable since 04/03/2009. No new or  enlarging pulmonary nodule. No pleural effusion.  Bones: Bone windows reveal no worrisome lytic or sclerotic osseous lesions.  Upper Abdomen: Stable appearance of hepatic steatosis. Visualized portions of the upper abdomen are otherwise unremarkable.  IMPRESSION: 1. Stable exam. Postsurgical changes in the right lung with stable 13 mm left lower lobe nodule. No new or progressive disease within the chest.   Electronically Signed   By: Misty Stanley M.D.   On: 08/24/2014 13:29   ASSESSMENT AND PLAN: This is a very pleasant 68 years old [place] American male with stage IA non-small cell lung cancer status post right upper lobectomy and has been observation since May of 2010 daily with no evidence for disease recurrence. I discussed the scan results with the  patient today.  I recommended for him to continue on observation with repeat CT scan of the chest in one year. He was advised to call immediately if he has any concerning symptoms in the interval.  The patient voices understanding of current disease status and treatment options and is in agreement with the current care plan.  All questions were answered. The patient knows to call the clinic with any problems, questions or concerns. We can certainly see the patient much sooner if necessary.  Disclaimer: This note was dictated with voice recognition software. Similar sounding words can inadvertently be transcribed and may be missed upon review.

## 2014-09-03 ENCOUNTER — Encounter: Payer: Self-pay | Admitting: Gastroenterology

## 2014-12-15 DIAGNOSIS — E782 Mixed hyperlipidemia: Secondary | ICD-10-CM | POA: Diagnosis not present

## 2014-12-15 DIAGNOSIS — M7020 Olecranon bursitis, unspecified elbow: Secondary | ICD-10-CM | POA: Diagnosis not present

## 2014-12-15 DIAGNOSIS — E1165 Type 2 diabetes mellitus with hyperglycemia: Secondary | ICD-10-CM | POA: Diagnosis not present

## 2014-12-15 DIAGNOSIS — M109 Gout, unspecified: Secondary | ICD-10-CM | POA: Diagnosis not present

## 2014-12-15 DIAGNOSIS — I10 Essential (primary) hypertension: Secondary | ICD-10-CM | POA: Diagnosis not present

## 2014-12-22 DIAGNOSIS — M10022 Idiopathic gout, left elbow: Secondary | ICD-10-CM | POA: Diagnosis not present

## 2015-03-22 DIAGNOSIS — M10022 Idiopathic gout, left elbow: Secondary | ICD-10-CM | POA: Diagnosis not present

## 2015-04-15 DIAGNOSIS — I10 Essential (primary) hypertension: Secondary | ICD-10-CM | POA: Diagnosis not present

## 2015-04-15 DIAGNOSIS — G4730 Sleep apnea, unspecified: Secondary | ICD-10-CM | POA: Diagnosis not present

## 2015-04-15 DIAGNOSIS — E139 Other specified diabetes mellitus without complications: Secondary | ICD-10-CM | POA: Diagnosis not present

## 2015-04-15 DIAGNOSIS — E782 Mixed hyperlipidemia: Secondary | ICD-10-CM | POA: Diagnosis not present

## 2015-04-15 DIAGNOSIS — M109 Gout, unspecified: Secondary | ICD-10-CM | POA: Diagnosis not present

## 2015-04-19 DIAGNOSIS — N400 Enlarged prostate without lower urinary tract symptoms: Secondary | ICD-10-CM | POA: Diagnosis not present

## 2015-04-19 DIAGNOSIS — C674 Malignant neoplasm of posterior wall of bladder: Secondary | ICD-10-CM | POA: Diagnosis not present

## 2015-07-16 ENCOUNTER — Telehealth: Payer: Self-pay | Admitting: Internal Medicine

## 2015-07-16 NOTE — Telephone Encounter (Signed)
Returned patient's call re changing time of appointments on 9/20 and 9/27 to between 10 am - 12 pm. Not able to reach patient on home phone or leave message (full). Called cell and left message re new appointment times for lab/ct 9/20 @ 10:45 am and 12 pm and f/u w/MM 9/27 @ 10:45 am. New schedule mailed.

## 2015-08-24 ENCOUNTER — Other Ambulatory Visit (HOSPITAL_BASED_OUTPATIENT_CLINIC_OR_DEPARTMENT_OTHER): Payer: Medicare Other

## 2015-08-24 ENCOUNTER — Ambulatory Visit (HOSPITAL_COMMUNITY): Payer: Medicare Other

## 2015-08-24 ENCOUNTER — Ambulatory Visit (HOSPITAL_COMMUNITY)
Admission: RE | Admit: 2015-08-24 | Discharge: 2015-08-24 | Disposition: A | Discharge status: Home / Self Care | Payer: Medicare Other | Source: Ambulatory Visit | Attending: Internal Medicine | Admitting: Internal Medicine

## 2015-08-24 DIAGNOSIS — K760 Fatty (change of) liver, not elsewhere classified: Secondary | ICD-10-CM | POA: Insufficient documentation

## 2015-08-24 DIAGNOSIS — R918 Other nonspecific abnormal finding of lung field: Secondary | ICD-10-CM | POA: Insufficient documentation

## 2015-08-24 DIAGNOSIS — C3490 Malignant neoplasm of unspecified part of unspecified bronchus or lung: Secondary | ICD-10-CM | POA: Diagnosis present

## 2015-08-24 DIAGNOSIS — Z902 Acquired absence of lung [part of]: Secondary | ICD-10-CM | POA: Insufficient documentation

## 2015-08-24 DIAGNOSIS — Z85118 Personal history of other malignant neoplasm of bronchus and lung: Secondary | ICD-10-CM | POA: Insufficient documentation

## 2015-08-24 LAB — COMPREHENSIVE METABOLIC PANEL (CC13)
ALT: 41 U/L (ref 0–55)
AST: 29 U/L (ref 5–34)
Albumin: 3.6 g/dL (ref 3.5–5.0)
Alkaline Phosphatase: 63 U/L (ref 40–150)
Anion Gap: 8 mEq/L (ref 3–11)
BUN: 10.2 mg/dL (ref 7.0–26.0)
CHLORIDE: 108 meq/L (ref 98–109)
CO2: 26 meq/L (ref 22–29)
Calcium: 9 mg/dL (ref 8.4–10.4)
Creatinine: 0.8 mg/dL (ref 0.7–1.3)
GLUCOSE: 94 mg/dL (ref 70–140)
POTASSIUM: 4 meq/L (ref 3.5–5.1)
SODIUM: 142 meq/L (ref 136–145)
Total Bilirubin: 0.25 mg/dL (ref 0.20–1.20)
Total Protein: 6.8 g/dL (ref 6.4–8.3)

## 2015-08-24 LAB — CBC WITH DIFFERENTIAL/PLATELET
BASO%: 1 % (ref 0.0–2.0)
Basophils Absolute: 0.1 10*3/uL (ref 0.0–0.1)
EOS%: 1.4 % (ref 0.0–7.0)
Eosinophils Absolute: 0.1 10*3/uL (ref 0.0–0.5)
HCT: 43.1 % (ref 38.4–49.9)
HEMOGLOBIN: 13.7 g/dL (ref 13.0–17.1)
LYMPH%: 26.7 % (ref 14.0–49.0)
MCH: 25.8 pg — AB (ref 27.2–33.4)
MCHC: 31.9 g/dL — AB (ref 32.0–36.0)
MCV: 81.1 fL (ref 79.3–98.0)
MONO#: 0.6 10*3/uL (ref 0.1–0.9)
MONO%: 7.4 % (ref 0.0–14.0)
NEUT#: 5.1 10*3/uL (ref 1.5–6.5)
NEUT%: 63.5 % (ref 39.0–75.0)
Platelets: 250 10*3/uL (ref 140–400)
RBC: 5.32 10*6/uL (ref 4.20–5.82)
RDW: 15.1 % — AB (ref 11.0–14.6)
WBC: 8.1 10*3/uL (ref 4.0–10.3)
lymph#: 2.2 10*3/uL (ref 0.9–3.3)

## 2015-08-27 ENCOUNTER — Telehealth: Payer: Self-pay | Admitting: Medical Oncology

## 2015-08-27 NOTE — Telephone Encounter (Signed)
Pt stated he missed a call. i callled him back and told him I do not know who called but i confirmed his appt next week

## 2015-08-31 ENCOUNTER — Ambulatory Visit (HOSPITAL_BASED_OUTPATIENT_CLINIC_OR_DEPARTMENT_OTHER): Payer: Medicare Other | Admitting: Internal Medicine

## 2015-08-31 ENCOUNTER — Encounter: Payer: Self-pay | Admitting: Internal Medicine

## 2015-08-31 VITALS — BP 149/88 | HR 72 | Temp 98.3°F | Resp 16 | Ht 70.0 in | Wt 243.2 lb

## 2015-08-31 DIAGNOSIS — C3411 Malignant neoplasm of upper lobe, right bronchus or lung: Secondary | ICD-10-CM

## 2015-08-31 NOTE — Progress Notes (Signed)
Bobby [name] [phone]   [phone]  OFFICE PROGRESS NOTE  Bobby Hams, MD [address] [place] [address]  PRINCIPAL DIAGNOSIS: Stage IA (T1a N0 M0) non-small cell lung cancer, adenocarcinoma with bronchoalveolar features diagnosed in May 2010.   PRIOR THERAPY: Status post right upper lobectomy on Apr 30, 2009 under the care of Dr. Arlyce Dice.   CURRENT THERAPY: Observation.  INTERVAL HISTORY: Bobby [name] 69 y.o. male returns to the clinic today for annual followup visit. He has been observation for the last 6 years. The patient is feeling fine today with no specific complaints. He denied having any significant weight loss or night sweats. He has no chest pain, shortness of breath, cough or hemoptysis. The patient has no nausea or vomiting. He has repeat CT scan of the chest performed recently and he is here for evaluation and discussion of his lab and her scan results.  MEDICAL HISTORY: Past Medical History  Diagnosis Date  . Hypertension   . Hyperlipidemia   . History of lung cancer     STAGE 1A NON-SMALL CELL LUNG CARCINOMA ---  04-10-2009   S/P RIGHT UPPER LOBECTOMY , NO CHEMORADIATION--  NO RECURRENCE  (ONCOLOGIST-- DR [facility])  . Type 2 diabetes mellitus   . BPH (benign prostatic hypertrophy)   . Pulmonary nodule seen on imaging study     STABLE LEFT LOWER LOBE NODULE  PER CHEST CT--  MONITORED BY DR [facility]  . Frequency of urination   . Urgency of urination     ALLERGIES:  has No Known Allergies.  MEDICATIONS:  Current Outpatient Prescriptions  Medication Sig Dispense Refill  . amLODipine-benazepril (LOTREL) 5-20 MG per capsule Take 1 capsule by mouth every morning.     Marland Kitchen b complex vitamins tablet Take 1 tablet by mouth daily.    . busPIRone (BUSPAR) 15 MG tablet     . cholecalciferol (VITAMIN D) 1000 UNITS tablet Take 1,000 Units by mouth daily.    Marland Kitchen dexamethasone (DECADRON) 4 MG tablet Take 4 mg by  mouth daily.  0  . escitalopram (LEXAPRO) 20 MG tablet     . indomethacin (INDOCIN) 50 MG capsule Take 50 mg by mouth 2 (two) times daily.  3  . prazosin (MINIPRESS) 2 MG capsule     . ramipril (ALTACE) 1.25 MG capsule Take 1.25 mg by mouth every morning.     Marland Kitchen SIMCOR 500-20 MG 24 hr tablet Take 1 tablet by mouth At bedtime.     . simvastatin (ZOCOR) 20 MG tablet 1 TABLET IN THE EVENING ONCE A DAY ORALLY 90 DAYS  1  . sitaGLIPtan-metformin (JANUMET) 50-500 MG per tablet Take 1 tablet by mouth 2 (two) times daily with a meal.      No current facility-administered medications for this visit.    SURGICAL HISTORY:  Past Surgical History  Procedure Laterality Date  . Carpal tunnel release Right   . Knee arthroscopy w/ meniscectomy Left 10-31-2004  . Left shoulder arthroscopy/ acromioplasty/ decompression/ synovectomy  11-20-2007  . Video assisted thoracoscopy (vats)/ lobectomy Right 04-10-2009  DR BURNEY    RIGHT UPPER LOBECTOMY AND NODE DISSECTION  . Cardiovascular stress test  04-08-2009    NORMAL NUCLEAR STUDY/ NO ISCHEMIA/ EF 60%  . Transurethral resection of prostate N/A 11/03/2013    Procedure: TRANSURETHRAL RESECTION OF THE PROSTATE WITH GYRUS INSTRUMENTS;  Surgeon: Franchot Gallo, MD;  Location: [facility];  Service: Urology;  Laterality:  N/A;    REVIEW OF SYSTEMS:  A comprehensive review of systems was negative.   PHYSICAL EXAMINATION: General appearance: alert, cooperative and no distress Head: Normocephalic, without obvious abnormality, atraumatic Neck: no adenopathy, supple, symmetrical, trachea midline and thyroid not enlarged, symmetric, no tenderness/mass/nodules Lymph nodes: Cervical, supraclavicular, and axillary nodes normal. Resp: clear to auscultation bilaterally and normal percussion bilaterally Cardio: regular rate and rhythm, S1, S2 normal, no murmur, click, rub or gallop GI: soft, non-tender; bowel sounds normal; no masses,  no  organomegaly Extremities: extremities normal, atraumatic, no cyanosis or edema  ECOG PERFORMANCE STATUS: 1 - Symptomatic but completely ambulatory  Blood pressure 149/88, pulse 72, temperature 98.3 F (36.8 C), temperature source Oral, resp. rate 16, height '5\' 10"'$  (1.778 m), weight 243 lb 3.2 oz (110.315 kg), SpO2 99 %.  LABORATORY DATA: Lab Results  Component Value Date   WBC 8.1 08/24/2015   HGB 13.7 08/24/2015   HCT 43.1 08/24/2015   MCV 81.1 08/24/2015   PLT 250 08/24/2015      Chemistry      Component Value Date/Time   NA 142 08/24/2015 1100   NA 137 11/08/2013 1745   NA 137 06/26/2012 0941   K 4.0 08/24/2015 1100   K 3.8 11/08/2013 1745   K 3.9 06/26/2012 0941   CL 103 11/08/2013 1745   CL 108* 02/11/2013 0811   CL 102 06/26/2012 0941   CO2 26 08/24/2015 1100   CO2 23 11/08/2013 1745   CO2 27 06/26/2012 0941   BUN 10.2 08/24/2015 1100   BUN 16 11/08/2013 1745   BUN 16 06/26/2012 0941   CREATININE 0.8 08/24/2015 1100   CREATININE 0.98 11/08/2013 1745   CREATININE 1.2 06/26/2012 0941      Component Value Date/Time   CALCIUM 9.0 08/24/2015 1100   CALCIUM 9.0 11/08/2013 1745   CALCIUM 9.0 06/26/2012 0941   ALKPHOS 63 08/24/2015 1100   ALKPHOS 43 06/26/2012 0941   ALKPHOS 47 06/03/2010 0955   AST 29 08/24/2015 1100   AST 20 06/26/2012 0941   AST 28 06/03/2010 0955   ALT 41 08/24/2015 1100   ALT 33 06/26/2012 0941   ALT 27 06/03/2010 0955   BILITOT 0.25 08/24/2015 1100   BILITOT 0.60 06/26/2012 0941   BILITOT 0.6 06/03/2010 0955       RADIOGRAPHIC STUDIES: Ct Chest Wo Contrast  08/24/2015   CLINICAL DATA:  69 year old male with a history of stage IA (T1a N0 M0) non-small cell (adenocarcinoma) lung cancer diagnosed in 2010 status post right upper lobectomy, on observation, presenting for restaging.  EXAM: CT CHEST WITHOUT CONTRAST  TECHNIQUE: Multidetector CT imaging of the chest was performed following the standard protocol without IV contrast.   COMPARISON:  08/24/2014 chest CT.  FINDINGS: Mediastinum/Nodes: Normal heart size. No pericardial fluid/thickening. Great vessels are normal in course and caliber. Normal visualized thyroid. Normal esophagus. No axillary, mediastinal or gross hilar lymphadenopathy, noting limited sensitivity for the detection of hilar adenopathy on this noncontrast study.  Lungs/Pleura: Stable postsurgical changes status post right upper lobectomy, with subsegmental scarring/atelectasis in the posterior right middle lobe and anterior right lower lobe, and small right middle lobe hernia at the thoracotomy defect anteriorly between the right fifth and sixth ribs. No acute consolidative airspace disease. There is a sub solid 1.4 cm left lower lobe pulmonary nodule (series 4/image 32), which is unchanged since at least 02/11/2013. There is a sub solid 0.7 cm peripheral left lower lobe pulmonary nodule (4/42), unchanged since  at least 02/11/2013. No new significant pulmonary nodules or lung masses.  Upper abdomen: Diffuse hepatic steatosis.  Musculoskeletal: No aggressive appearing focal osseous lesions. Mild degenerative changes in the thoracic spine.  IMPRESSION: 1. Stable postsurgical changes status post right upper lobectomy, with no evidence of local tumor recurrence or metastatic disease in the chest. 2. Two sub-solid left lower lobe pulmonary nodules are stable for greater than 2 years, suggesting benign etiology. 3. Diffuse hepatic steatosis.   Electronically Signed   By: Ilona Sorrel M.D.   On: 08/24/2015 15:51    ASSESSMENT AND PLAN: This is a very pleasant 69 years old [place] American male with stage IA non-small cell lung cancer status post right upper lobectomy and has been observation since May of 2010 daily with no evidence for disease recurrence. I discussed the scan results with the patient today.  I discussed with him the option of follow-up with the survival clinic at the [facility] versus routine follow-up  visit and evaluation by his primary care physician. The patient is interested in continuing his care with his primary care physician Dr. Delfina Redwood. I would see him on as-needed basis at this point. He was advised to call immediately if he has any concerning symptoms.  The patient voices understanding of current disease status and treatment options and is in agreement with the current care plan.  All questions were answered. The patient knows to call the clinic with any problems, questions or concerns. We can certainly see the patient much sooner if necessary.  Disclaimer: This note was dictated with voice recognition software. Similar sounding words can inadvertently be transcribed and may be missed upon review.

## 2015-09-08 DIAGNOSIS — G4730 Sleep apnea, unspecified: Secondary | ICD-10-CM | POA: Diagnosis not present

## 2015-09-08 DIAGNOSIS — E139 Other specified diabetes mellitus without complications: Secondary | ICD-10-CM | POA: Diagnosis not present

## 2015-09-08 DIAGNOSIS — Z23 Encounter for immunization: Secondary | ICD-10-CM | POA: Diagnosis not present

## 2015-09-08 DIAGNOSIS — M109 Gout, unspecified: Secondary | ICD-10-CM | POA: Diagnosis not present

## 2015-09-08 DIAGNOSIS — E782 Mixed hyperlipidemia: Secondary | ICD-10-CM | POA: Diagnosis not present

## 2015-09-08 DIAGNOSIS — I10 Essential (primary) hypertension: Secondary | ICD-10-CM | POA: Diagnosis not present

## 2015-09-08 DIAGNOSIS — Z1389 Encounter for screening for other disorder: Secondary | ICD-10-CM | POA: Diagnosis not present

## 2015-09-08 DIAGNOSIS — Z0000 Encounter for general adult medical examination without abnormal findings: Secondary | ICD-10-CM | POA: Diagnosis not present

## 2016-01-11 DIAGNOSIS — Z7984 Long term (current) use of oral hypoglycemic drugs: Secondary | ICD-10-CM | POA: Diagnosis not present

## 2016-01-11 DIAGNOSIS — I10 Essential (primary) hypertension: Secondary | ICD-10-CM | POA: Diagnosis not present

## 2016-01-11 DIAGNOSIS — Z85118 Personal history of other malignant neoplasm of bronchus and lung: Secondary | ICD-10-CM | POA: Diagnosis not present

## 2016-01-11 DIAGNOSIS — Z6836 Body mass index (BMI) 36.0-36.9, adult: Secondary | ICD-10-CM | POA: Diagnosis not present

## 2016-01-11 DIAGNOSIS — E663 Overweight: Secondary | ICD-10-CM | POA: Diagnosis not present

## 2016-01-11 DIAGNOSIS — E782 Mixed hyperlipidemia: Secondary | ICD-10-CM | POA: Diagnosis not present

## 2016-01-11 DIAGNOSIS — E119 Type 2 diabetes mellitus without complications: Secondary | ICD-10-CM | POA: Diagnosis not present

## 2016-02-15 DIAGNOSIS — R0981 Nasal congestion: Secondary | ICD-10-CM | POA: Diagnosis not present

## 2016-05-11 DIAGNOSIS — M109 Gout, unspecified: Secondary | ICD-10-CM | POA: Diagnosis not present

## 2016-05-11 DIAGNOSIS — Z7984 Long term (current) use of oral hypoglycemic drugs: Secondary | ICD-10-CM | POA: Diagnosis not present

## 2016-05-11 DIAGNOSIS — Z85118 Personal history of other malignant neoplasm of bronchus and lung: Secondary | ICD-10-CM | POA: Diagnosis not present

## 2016-05-11 DIAGNOSIS — E119 Type 2 diabetes mellitus without complications: Secondary | ICD-10-CM | POA: Diagnosis not present

## 2016-05-11 DIAGNOSIS — E663 Overweight: Secondary | ICD-10-CM | POA: Diagnosis not present

## 2016-05-11 DIAGNOSIS — Z6836 Body mass index (BMI) 36.0-36.9, adult: Secondary | ICD-10-CM | POA: Diagnosis not present

## 2016-05-11 DIAGNOSIS — I10 Essential (primary) hypertension: Secondary | ICD-10-CM | POA: Diagnosis not present

## 2016-05-11 DIAGNOSIS — E7800 Pure hypercholesterolemia, unspecified: Secondary | ICD-10-CM | POA: Diagnosis not present

## 2016-05-24 DIAGNOSIS — N400 Enlarged prostate without lower urinary tract symptoms: Secondary | ICD-10-CM | POA: Diagnosis not present

## 2016-05-24 DIAGNOSIS — N5201 Erectile dysfunction due to arterial insufficiency: Secondary | ICD-10-CM | POA: Diagnosis not present

## 2016-05-24 DIAGNOSIS — C670 Malignant neoplasm of trigone of bladder: Secondary | ICD-10-CM | POA: Diagnosis not present

## 2016-05-31 DIAGNOSIS — M25511 Pain in right shoulder: Secondary | ICD-10-CM | POA: Diagnosis not present

## 2016-05-31 DIAGNOSIS — M25561 Pain in right knee: Secondary | ICD-10-CM | POA: Diagnosis not present

## 2016-05-31 DIAGNOSIS — M542 Cervicalgia: Secondary | ICD-10-CM | POA: Diagnosis not present

## 2016-06-01 ENCOUNTER — Other Ambulatory Visit: Payer: Self-pay | Admitting: Orthopaedic Surgery

## 2016-06-01 DIAGNOSIS — M542 Cervicalgia: Secondary | ICD-10-CM

## 2016-06-14 ENCOUNTER — Ambulatory Visit
Admission: RE | Admit: 2016-06-14 | Discharge: 2016-06-14 | Disposition: A | Discharge status: Home / Self Care | Payer: Medicare PPO | Source: Ambulatory Visit | Attending: Orthopaedic Surgery | Admitting: Orthopaedic Surgery

## 2016-06-14 DIAGNOSIS — M542 Cervicalgia: Secondary | ICD-10-CM

## 2016-06-14 DIAGNOSIS — E291 Testicular hypofunction: Secondary | ICD-10-CM | POA: Diagnosis not present

## 2016-06-14 DIAGNOSIS — M4802 Spinal stenosis, cervical region: Secondary | ICD-10-CM | POA: Diagnosis not present

## 2016-06-28 DIAGNOSIS — M542 Cervicalgia: Secondary | ICD-10-CM | POA: Diagnosis not present

## 2016-06-28 DIAGNOSIS — M25561 Pain in right knee: Secondary | ICD-10-CM | POA: Diagnosis not present

## 2016-07-25 ENCOUNTER — Other Ambulatory Visit: Payer: Self-pay | Admitting: Internal Medicine

## 2016-07-25 DIAGNOSIS — Z85118 Personal history of other malignant neoplasm of bronchus and lung: Secondary | ICD-10-CM

## 2016-07-26 DIAGNOSIS — G5602 Carpal tunnel syndrome, left upper limb: Secondary | ICD-10-CM | POA: Diagnosis not present

## 2016-08-25 ENCOUNTER — Ambulatory Visit
Admission: RE | Admit: 2016-08-25 | Discharge: 2016-08-25 | Disposition: A | Discharge status: Home / Self Care | Payer: Medicare PPO | Source: Ambulatory Visit | Attending: Internal Medicine | Admitting: Internal Medicine

## 2016-08-25 DIAGNOSIS — Z85118 Personal history of other malignant neoplasm of bronchus and lung: Secondary | ICD-10-CM

## 2016-08-25 DIAGNOSIS — C3490 Malignant neoplasm of unspecified part of unspecified bronchus or lung: Secondary | ICD-10-CM | POA: Diagnosis not present

## 2016-09-26 DIAGNOSIS — I10 Essential (primary) hypertension: Secondary | ICD-10-CM | POA: Diagnosis not present

## 2016-09-26 DIAGNOSIS — M109 Gout, unspecified: Secondary | ICD-10-CM | POA: Diagnosis not present

## 2016-09-26 DIAGNOSIS — E7800 Pure hypercholesterolemia, unspecified: Secondary | ICD-10-CM | POA: Diagnosis not present

## 2016-09-26 DIAGNOSIS — Z23 Encounter for immunization: Secondary | ICD-10-CM | POA: Diagnosis not present

## 2016-09-26 DIAGNOSIS — E663 Overweight: Secondary | ICD-10-CM | POA: Diagnosis not present

## 2016-09-26 DIAGNOSIS — E1329 Other specified diabetes mellitus with other diabetic kidney complication: Secondary | ICD-10-CM | POA: Diagnosis not present

## 2016-09-26 DIAGNOSIS — Z1389 Encounter for screening for other disorder: Secondary | ICD-10-CM | POA: Diagnosis not present

## 2016-09-26 DIAGNOSIS — Z85118 Personal history of other malignant neoplasm of bronchus and lung: Secondary | ICD-10-CM | POA: Diagnosis not present

## 2016-09-26 DIAGNOSIS — Z0000 Encounter for general adult medical examination without abnormal findings: Secondary | ICD-10-CM | POA: Diagnosis not present

## 2016-10-24 DIAGNOSIS — J0100 Acute maxillary sinusitis, unspecified: Secondary | ICD-10-CM | POA: Diagnosis not present

## 2016-10-24 DIAGNOSIS — H6123 Impacted cerumen, bilateral: Secondary | ICD-10-CM | POA: Diagnosis not present

## 2016-11-08 DIAGNOSIS — G5602 Carpal tunnel syndrome, left upper limb: Secondary | ICD-10-CM | POA: Diagnosis not present

## 2016-11-15 ENCOUNTER — Other Ambulatory Visit: Payer: Self-pay | Admitting: *Deleted

## 2016-11-15 DIAGNOSIS — G5602 Carpal tunnel syndrome, left upper limb: Secondary | ICD-10-CM

## 2016-11-21 ENCOUNTER — Ambulatory Visit (INDEPENDENT_AMBULATORY_CARE_PROVIDER_SITE_OTHER): Payer: Medicare PPO | Admitting: Neurology

## 2016-11-21 DIAGNOSIS — G5602 Carpal tunnel syndrome, left upper limb: Secondary | ICD-10-CM

## 2016-11-21 NOTE — Procedures (Signed)
[facility]  [place], [address]  [place], [place] [address] Tel: [phone] Fax:  [phone] Test Date:  11/21/2016  Patient: Bobby [name] DOB: [birth year] Physician: Narda Amber, DO  Sex: Male Height: '5\' 10"'$  Ref Phys: Odella Aquas. Towanda Malkin, MD  ID#: [id number] Temp: 36.5C Technician: Jerilynn Mages. Dean   Patient Complaints: This is a 70 year old gentleman referred for evaluation of left hand numbness, tingling, and pain.   NCV & EMG Findings: Extensive electrodiagnostic testing of the left upper extremity shows:  1. Left median sensory response shows prolonged latency (5.8 ms) and normal amplitude. Left ulnar sensory response is within normal limits.  2. Median motor response shows prolonged latency (5.7 ms) and normal amplitude. Left ulnar motor responses within normal limits. 3. There is no evidence of active or chronic motor axon loss changes affecting any of the tested muscles. Motor unit configuration and recruitment pattern is within normal limits.  Impression: 1. Left median neuropathy at or distal to the wrist, consistent with the clinical diagnosis of carpal tunnel syndrome. Overall, these findings are moderate in degree electrically. 2. There is no evidence of a cervical radiculopathy affecting the left upper extremity.   ___________________________ Narda Amber, DO    Nerve Conduction Studies Anti Sensory Summary Table   Stim Site NR Peak (ms) Norm Peak (ms) P-T Amp (V) Norm P-T Amp  Left Median Anti Sensory (2nd Digit)  36.5C  Wrist    5.8 <3.8 11.7 >10  Left Ulnar Anti Sensory (5th Digit)  36.5C  Wrist    3.1 <3.2 10.4 >5   Motor Summary Table   Stim Site NR Onset (ms) Norm Onset (ms) O-P Amp (mV) Norm O-P Amp Site1 Site2 Delta-0 (ms) Dist (cm) Vel (m/s) Norm Vel (m/s)  Left Median Motor (Abd Poll Brev)  36.5C  Wrist    5.7 <4.0 8.0 >5 Elbow Wrist 5.2 27.0 52 >50  Elbow    10.9  7.9         Left Ulnar Motor (Abd Dig Minimi)  36.5C  Wrist     2.9 <3.1 9.0 >7 B Elbow Wrist 4.1 23.0 56 >50  B Elbow    7.0  8.0  A Elbow B Elbow 1.5 10.0 67 >50  A Elbow    8.5  7.3          EMG   Side Muscle Ins Act Fibs Psw Fasc Number Recrt Dur Dur. Amp Amp. Poly Poly. Comment  Left 1stDorInt Nml Nml Nml Nml Nml Nml Nml Nml Nml Nml Nml Nml N/A  Left Abd Poll Brev Nml Nml Nml Nml Nml Nml Nml Nml Nml Nml Nml Nml N/A  Left Ext Indicis Nml Nml Nml Nml Nml Nml Nml Nml Nml Nml Nml Nml N/A  Left PronatorTeres Nml Nml Nml Nml Nml Nml Nml Nml Nml Nml Nml Nml N/A  Left Biceps Nml Nml Nml Nml Nml Nml Nml Nml Nml Nml Nml Nml N/A  Left Triceps Nml Nml Nml Nml Nml Nml Nml Nml Nml Nml Nml Nml N/A  Left Deltoid Nml Nml Nml Nml Nml Nml Nml Nml Nml Nml Nml Nml N/A      Waveforms:

## 2016-12-06 ENCOUNTER — Telehealth: Payer: Self-pay | Admitting: Neurology

## 2016-12-06 NOTE — Telephone Encounter (Signed)
EMG faxed.

## 2016-12-06 NOTE — Telephone Encounter (Signed)
[name] Jul 06, 2046. Bobby called from [place] [facility]. She needs faxed [phone]  to her his EMG results. Thank you

## 2017-01-16 DIAGNOSIS — L918 Other hypertrophic disorders of the skin: Secondary | ICD-10-CM | POA: Diagnosis not present

## 2017-01-16 DIAGNOSIS — L258 Unspecified contact dermatitis due to other agents: Secondary | ICD-10-CM | POA: Diagnosis not present

## 2017-01-18 DIAGNOSIS — M25531 Pain in right wrist: Secondary | ICD-10-CM | POA: Diagnosis not present

## 2017-01-18 DIAGNOSIS — M79641 Pain in right hand: Secondary | ICD-10-CM | POA: Diagnosis not present

## 2017-01-18 DIAGNOSIS — S63511S Sprain of carpal joint of right wrist, sequela: Secondary | ICD-10-CM | POA: Diagnosis not present

## 2017-01-18 DIAGNOSIS — M19031 Primary osteoarthritis, right wrist: Secondary | ICD-10-CM | POA: Diagnosis not present

## 2017-02-15 DIAGNOSIS — M25531 Pain in right wrist: Secondary | ICD-10-CM | POA: Diagnosis not present

## 2017-02-15 DIAGNOSIS — M79641 Pain in right hand: Secondary | ICD-10-CM | POA: Diagnosis not present

## 2017-03-19 DIAGNOSIS — M50322 Other cervical disc degeneration at C5-C6 level: Secondary | ICD-10-CM | POA: Diagnosis not present

## 2017-03-19 DIAGNOSIS — M545 Low back pain: Secondary | ICD-10-CM | POA: Diagnosis not present

## 2017-03-19 DIAGNOSIS — G8929 Other chronic pain: Secondary | ICD-10-CM | POA: Diagnosis not present

## 2017-03-19 DIAGNOSIS — M542 Cervicalgia: Secondary | ICD-10-CM | POA: Diagnosis not present

## 2017-03-27 DIAGNOSIS — E1329 Other specified diabetes mellitus with other diabetic kidney complication: Secondary | ICD-10-CM | POA: Diagnosis not present

## 2017-03-27 DIAGNOSIS — Z6836 Body mass index (BMI) 36.0-36.9, adult: Secondary | ICD-10-CM | POA: Diagnosis not present

## 2017-03-27 DIAGNOSIS — I10 Essential (primary) hypertension: Secondary | ICD-10-CM | POA: Diagnosis not present

## 2017-03-27 DIAGNOSIS — E1165 Type 2 diabetes mellitus with hyperglycemia: Secondary | ICD-10-CM | POA: Diagnosis not present

## 2017-03-27 DIAGNOSIS — M109 Gout, unspecified: Secondary | ICD-10-CM | POA: Diagnosis not present

## 2017-03-27 DIAGNOSIS — Z7984 Long term (current) use of oral hypoglycemic drugs: Secondary | ICD-10-CM | POA: Diagnosis not present

## 2017-03-27 DIAGNOSIS — E663 Overweight: Secondary | ICD-10-CM | POA: Diagnosis not present

## 2017-03-27 DIAGNOSIS — E7800 Pure hypercholesterolemia, unspecified: Secondary | ICD-10-CM | POA: Diagnosis not present

## 2017-03-30 DIAGNOSIS — M542 Cervicalgia: Secondary | ICD-10-CM | POA: Diagnosis not present

## 2017-04-04 DIAGNOSIS — M545 Low back pain: Secondary | ICD-10-CM | POA: Diagnosis not present

## 2017-04-06 DIAGNOSIS — M5031 Other cervical disc degeneration,  high cervical region: Secondary | ICD-10-CM | POA: Diagnosis not present

## 2017-04-06 DIAGNOSIS — M542 Cervicalgia: Secondary | ICD-10-CM | POA: Diagnosis not present

## 2017-04-06 DIAGNOSIS — M5021 Other cervical disc displacement,  high cervical region: Secondary | ICD-10-CM | POA: Diagnosis not present

## 2017-04-06 DIAGNOSIS — M4682 Other specified inflammatory spondylopathies, cervical region: Secondary | ICD-10-CM | POA: Diagnosis not present

## 2017-04-18 DIAGNOSIS — M542 Cervicalgia: Secondary | ICD-10-CM | POA: Diagnosis not present

## 2017-04-24 DIAGNOSIS — M542 Cervicalgia: Secondary | ICD-10-CM | POA: Diagnosis not present

## 2017-04-25 DIAGNOSIS — M542 Cervicalgia: Secondary | ICD-10-CM | POA: Diagnosis not present

## 2017-05-02 DIAGNOSIS — E291 Testicular hypofunction: Secondary | ICD-10-CM | POA: Diagnosis not present

## 2017-05-02 DIAGNOSIS — R948 Abnormal results of function studies of other organs and systems: Secondary | ICD-10-CM | POA: Diagnosis not present

## 2017-05-04 DIAGNOSIS — M542 Cervicalgia: Secondary | ICD-10-CM | POA: Diagnosis not present

## 2017-05-08 DIAGNOSIS — M542 Cervicalgia: Secondary | ICD-10-CM | POA: Diagnosis not present

## 2017-05-11 DIAGNOSIS — M542 Cervicalgia: Secondary | ICD-10-CM | POA: Diagnosis not present

## 2017-05-14 DIAGNOSIS — M545 Low back pain: Secondary | ICD-10-CM | POA: Diagnosis not present

## 2017-05-14 DIAGNOSIS — M542 Cervicalgia: Secondary | ICD-10-CM | POA: Diagnosis not present

## 2017-05-14 DIAGNOSIS — G8929 Other chronic pain: Secondary | ICD-10-CM | POA: Diagnosis not present

## 2017-05-15 DIAGNOSIS — M542 Cervicalgia: Secondary | ICD-10-CM | POA: Diagnosis not present

## 2017-05-18 DIAGNOSIS — M542 Cervicalgia: Secondary | ICD-10-CM | POA: Diagnosis not present

## 2017-05-21 DIAGNOSIS — M542 Cervicalgia: Secondary | ICD-10-CM | POA: Diagnosis not present

## 2017-05-25 DIAGNOSIS — M542 Cervicalgia: Secondary | ICD-10-CM | POA: Diagnosis not present

## 2017-05-28 DIAGNOSIS — M542 Cervicalgia: Secondary | ICD-10-CM | POA: Diagnosis not present

## 2017-05-30 DIAGNOSIS — M545 Low back pain: Secondary | ICD-10-CM | POA: Diagnosis not present

## 2017-06-01 DIAGNOSIS — N401 Enlarged prostate with lower urinary tract symptoms: Secondary | ICD-10-CM | POA: Diagnosis not present

## 2017-06-01 DIAGNOSIS — C678 Malignant neoplasm of overlapping sites of bladder: Secondary | ICD-10-CM | POA: Diagnosis not present

## 2017-06-01 DIAGNOSIS — E291 Testicular hypofunction: Secondary | ICD-10-CM | POA: Diagnosis not present

## 2017-06-01 DIAGNOSIS — N5201 Erectile dysfunction due to arterial insufficiency: Secondary | ICD-10-CM | POA: Diagnosis not present

## 2017-06-01 DIAGNOSIS — R351 Nocturia: Secondary | ICD-10-CM | POA: Diagnosis not present

## 2017-06-12 DIAGNOSIS — M5021 Other cervical disc displacement,  high cervical region: Secondary | ICD-10-CM | POA: Diagnosis not present

## 2017-06-12 DIAGNOSIS — M50322 Other cervical disc degeneration at C5-C6 level: Secondary | ICD-10-CM | POA: Diagnosis not present

## 2017-06-12 DIAGNOSIS — M4682 Other specified inflammatory spondylopathies, cervical region: Secondary | ICD-10-CM | POA: Diagnosis not present

## 2017-06-21 DIAGNOSIS — M25531 Pain in right wrist: Secondary | ICD-10-CM | POA: Diagnosis not present

## 2017-06-21 DIAGNOSIS — M79641 Pain in right hand: Secondary | ICD-10-CM | POA: Diagnosis not present

## 2017-06-21 DIAGNOSIS — M19131 Post-traumatic osteoarthritis, right wrist: Secondary | ICD-10-CM | POA: Diagnosis not present

## 2017-06-21 DIAGNOSIS — M65332 Trigger finger, left middle finger: Secondary | ICD-10-CM | POA: Insufficient documentation

## 2017-06-21 DIAGNOSIS — M65341 Trigger finger, right ring finger: Secondary | ICD-10-CM | POA: Diagnosis not present

## 2017-07-12 DIAGNOSIS — Z6835 Body mass index (BMI) 35.0-35.9, adult: Secondary | ICD-10-CM | POA: Diagnosis not present

## 2017-07-16 DIAGNOSIS — J069 Acute upper respiratory infection, unspecified: Secondary | ICD-10-CM | POA: Diagnosis not present

## 2017-09-13 DIAGNOSIS — M109 Gout, unspecified: Secondary | ICD-10-CM | POA: Diagnosis not present

## 2017-09-13 DIAGNOSIS — E119 Type 2 diabetes mellitus without complications: Secondary | ICD-10-CM | POA: Diagnosis not present

## 2017-09-14 DIAGNOSIS — E291 Testicular hypofunction: Secondary | ICD-10-CM | POA: Diagnosis not present

## 2017-10-03 DIAGNOSIS — J410 Simple chronic bronchitis: Secondary | ICD-10-CM | POA: Diagnosis not present

## 2017-10-03 DIAGNOSIS — M109 Gout, unspecified: Secondary | ICD-10-CM | POA: Diagnosis not present

## 2017-10-11 ENCOUNTER — Other Ambulatory Visit: Payer: Self-pay | Admitting: Internal Medicine

## 2017-10-11 DIAGNOSIS — Z0000 Encounter for general adult medical examination without abnormal findings: Secondary | ICD-10-CM | POA: Diagnosis not present

## 2017-10-11 DIAGNOSIS — E1165 Type 2 diabetes mellitus with hyperglycemia: Secondary | ICD-10-CM | POA: Diagnosis not present

## 2017-10-11 DIAGNOSIS — Z1389 Encounter for screening for other disorder: Secondary | ICD-10-CM | POA: Diagnosis not present

## 2017-10-11 DIAGNOSIS — Z85118 Personal history of other malignant neoplasm of bronchus and lung: Secondary | ICD-10-CM

## 2017-10-11 DIAGNOSIS — E7800 Pure hypercholesterolemia, unspecified: Secondary | ICD-10-CM | POA: Diagnosis not present

## 2017-10-11 DIAGNOSIS — E663 Overweight: Secondary | ICD-10-CM | POA: Diagnosis not present

## 2017-10-11 DIAGNOSIS — E119 Type 2 diabetes mellitus without complications: Secondary | ICD-10-CM | POA: Diagnosis not present

## 2017-10-11 DIAGNOSIS — M109 Gout, unspecified: Secondary | ICD-10-CM | POA: Diagnosis not present

## 2017-10-11 DIAGNOSIS — I10 Essential (primary) hypertension: Secondary | ICD-10-CM | POA: Diagnosis not present

## 2017-10-17 ENCOUNTER — Ambulatory Visit
Admission: RE | Admit: 2017-10-17 | Discharge: 2017-10-17 | Disposition: A | Discharge status: Home / Self Care | Payer: Medicare PPO | Source: Ambulatory Visit | Attending: Internal Medicine | Admitting: Internal Medicine

## 2017-10-17 DIAGNOSIS — R918 Other nonspecific abnormal finding of lung field: Secondary | ICD-10-CM | POA: Diagnosis not present

## 2017-10-17 DIAGNOSIS — Z85118 Personal history of other malignant neoplasm of bronchus and lung: Secondary | ICD-10-CM

## 2017-10-22 ENCOUNTER — Telehealth: Payer: Self-pay | Admitting: Medical Oncology

## 2017-10-22 DIAGNOSIS — Z85118 Personal history of other malignant neoplasm of bronchus and lung: Secondary | ICD-10-CM | POA: Diagnosis not present

## 2017-10-22 DIAGNOSIS — R911 Solitary pulmonary nodule: Secondary | ICD-10-CM | POA: Diagnosis not present

## 2017-10-22 NOTE — Telephone Encounter (Signed)
Follow up in 3 months with repeat CT chest to monitor the spot seen on the recent scan.

## 2017-10-22 NOTE — Telephone Encounter (Signed)
Pt said Dr Delfina Redwood wants him to see Dr Julien Nordmann for something "On my scan"

## 2017-10-23 ENCOUNTER — Other Ambulatory Visit: Payer: Self-pay | Admitting: *Deleted

## 2017-10-23 DIAGNOSIS — C3411 Malignant neoplasm of upper lobe, right bronchus or lung: Secondary | ICD-10-CM

## 2017-11-06 DIAGNOSIS — M79641 Pain in right hand: Secondary | ICD-10-CM | POA: Diagnosis not present

## 2017-11-06 DIAGNOSIS — M65342 Trigger finger, left ring finger: Secondary | ICD-10-CM | POA: Diagnosis not present

## 2017-11-06 DIAGNOSIS — M65341 Trigger finger, right ring finger: Secondary | ICD-10-CM | POA: Diagnosis not present

## 2017-11-06 DIAGNOSIS — M65351 Trigger finger, right little finger: Secondary | ICD-10-CM | POA: Diagnosis not present

## 2017-11-06 DIAGNOSIS — M65352 Trigger finger, left little finger: Secondary | ICD-10-CM | POA: Diagnosis not present

## 2017-11-06 DIAGNOSIS — M65331 Trigger finger, right middle finger: Secondary | ICD-10-CM | POA: Diagnosis not present

## 2017-11-06 DIAGNOSIS — M25531 Pain in right wrist: Secondary | ICD-10-CM | POA: Diagnosis not present

## 2017-11-06 DIAGNOSIS — M19131 Post-traumatic osteoarthritis, right wrist: Secondary | ICD-10-CM | POA: Diagnosis not present

## 2017-11-13 ENCOUNTER — Encounter (HOSPITAL_COMMUNITY): Payer: Self-pay | Admitting: Emergency Medicine

## 2017-11-13 ENCOUNTER — Emergency Department (HOSPITAL_COMMUNITY): Payer: Medicare PPO

## 2017-11-13 ENCOUNTER — Emergency Department (HOSPITAL_COMMUNITY)
Admission: EM | Admit: 2017-11-13 | Discharge: 2017-11-13 | Disposition: A | Discharge status: Home / Self Care | Payer: Medicare PPO | Attending: Emergency Medicine | Admitting: Emergency Medicine

## 2017-11-13 DIAGNOSIS — M545 Low back pain, unspecified: Secondary | ICD-10-CM

## 2017-11-13 DIAGNOSIS — I10 Essential (primary) hypertension: Secondary | ICD-10-CM | POA: Insufficient documentation

## 2017-11-13 DIAGNOSIS — Z87891 Personal history of nicotine dependence: Secondary | ICD-10-CM | POA: Insufficient documentation

## 2017-11-13 DIAGNOSIS — M79604 Pain in right leg: Secondary | ICD-10-CM | POA: Insufficient documentation

## 2017-11-13 DIAGNOSIS — R51 Headache: Secondary | ICD-10-CM | POA: Diagnosis not present

## 2017-11-13 DIAGNOSIS — M542 Cervicalgia: Secondary | ICD-10-CM | POA: Insufficient documentation

## 2017-11-13 DIAGNOSIS — Z7984 Long term (current) use of oral hypoglycemic drugs: Secondary | ICD-10-CM | POA: Diagnosis not present

## 2017-11-13 DIAGNOSIS — E119 Type 2 diabetes mellitus without complications: Secondary | ICD-10-CM | POA: Diagnosis not present

## 2017-11-13 DIAGNOSIS — T148XXA Other injury of unspecified body region, initial encounter: Secondary | ICD-10-CM | POA: Diagnosis not present

## 2017-11-13 DIAGNOSIS — Z79899 Other long term (current) drug therapy: Secondary | ICD-10-CM | POA: Insufficient documentation

## 2017-11-13 MED ORDER — NAPROXEN 500 MG PO TABS: 500.0000 mg | ORAL_TABLET | Freq: Two times a day (BID) | ORAL | 0 refills | Status: DC

## 2017-11-13 MED ORDER — ACETAMINOPHEN 325 MG PO TABS
650.0000 mg | ORAL_TABLET | Freq: Once | ORAL | Status: AC
  Administered 2017-11-13: 650 mg via ORAL
  Filled 2017-11-13: qty 2

## 2017-11-13 MED ORDER — CYCLOBENZAPRINE HCL 10 MG PO TABS: 10.0000 mg | ORAL_TABLET | Freq: Every evening | ORAL | 0 refills | Status: DC | PRN
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
## 2017-11-13 NOTE — ED Notes (Signed)
Patient transported to X-ray 
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
## 2017-12-04 HISTORY — PX: TRIGGER FINGER RELEASE: SHX641

## 2017-12-20 DIAGNOSIS — M65351 Trigger finger, right little finger: Secondary | ICD-10-CM | POA: Diagnosis not present

## 2017-12-20 DIAGNOSIS — M65342 Trigger finger, left ring finger: Secondary | ICD-10-CM | POA: Diagnosis not present

## 2017-12-20 DIAGNOSIS — M65341 Trigger finger, right ring finger: Secondary | ICD-10-CM | POA: Diagnosis not present

## 2017-12-20 DIAGNOSIS — M65352 Trigger finger, left little finger: Secondary | ICD-10-CM | POA: Diagnosis not present

## 2017-12-20 DIAGNOSIS — M25531 Pain in right wrist: Secondary | ICD-10-CM | POA: Diagnosis not present

## 2017-12-20 DIAGNOSIS — M19131 Post-traumatic osteoarthritis, right wrist: Secondary | ICD-10-CM | POA: Diagnosis not present

## 2017-12-20 DIAGNOSIS — M65331 Trigger finger, right middle finger: Secondary | ICD-10-CM | POA: Diagnosis not present

## 2018-01-15 DIAGNOSIS — Z7984 Long term (current) use of oral hypoglycemic drugs: Secondary | ICD-10-CM | POA: Diagnosis not present

## 2018-01-15 DIAGNOSIS — R0981 Nasal congestion: Secondary | ICD-10-CM | POA: Diagnosis not present

## 2018-01-15 DIAGNOSIS — E119 Type 2 diabetes mellitus without complications: Secondary | ICD-10-CM | POA: Diagnosis not present

## 2018-01-15 DIAGNOSIS — J069 Acute upper respiratory infection, unspecified: Secondary | ICD-10-CM | POA: Diagnosis not present

## 2018-01-15 DIAGNOSIS — R0989 Other specified symptoms and signs involving the circulatory and respiratory systems: Secondary | ICD-10-CM | POA: Diagnosis not present

## 2018-01-17 ENCOUNTER — Other Ambulatory Visit: Payer: Self-pay | Admitting: *Deleted

## 2018-01-17 DIAGNOSIS — C3411 Malignant neoplasm of upper lobe, right bronchus or lung: Secondary | ICD-10-CM

## 2018-01-18 ENCOUNTER — Inpatient Hospital Stay: Payer: Medicare PPO | Attending: Internal Medicine

## 2018-01-18 DIAGNOSIS — I10 Essential (primary) hypertension: Secondary | ICD-10-CM | POA: Insufficient documentation

## 2018-01-18 DIAGNOSIS — Z85118 Personal history of other malignant neoplasm of bronchus and lung: Secondary | ICD-10-CM | POA: Diagnosis not present

## 2018-01-18 DIAGNOSIS — C3411 Malignant neoplasm of upper lobe, right bronchus or lung: Secondary | ICD-10-CM

## 2018-01-18 LAB — CMP (CANCER CENTER ONLY)
ALBUMIN: 3.7 g/dL (ref 3.5–5.0)
ALT: 25 U/L (ref 0–55)
ANION GAP: 9 (ref 3–11)
AST: 22 U/L (ref 5–34)
Alkaline Phosphatase: 60 U/L (ref 40–150)
BILIRUBIN TOTAL: 0.3 mg/dL (ref 0.2–1.2)
BUN: 17 mg/dL (ref 7–26)
CO2: 25 mmol/L (ref 22–29)
Calcium: 9.7 mg/dL (ref 8.4–10.4)
Chloride: 106 mmol/L (ref 98–109)
Creatinine: 1.12 mg/dL (ref 0.70–1.30)
GFR, Est AFR Am: >60 mL/min (ref >60)
GFR, Estimated: >60 mL/min (ref >60)
GLUCOSE: 115 mg/dL (ref 70–140)
POTASSIUM: 4.2 mmol/L (ref 3.5–5.1)
Sodium: 140 mmol/L (ref 136–145)
TOTAL PROTEIN: 7.1 g/dL (ref 6.4–8.3)

## 2018-01-18 LAB — CBC WITH DIFFERENTIAL (CANCER CENTER ONLY)
BASOS ABS: 0 10*3/uL (ref 0.0–0.1)
Basophils Relative: 0 %
Eosinophils Absolute: 0.2 10*3/uL (ref 0.0–0.5)
Eosinophils Relative: 2 %
HEMATOCRIT: 44.5 % (ref 38.4–49.9)
HEMOGLOBIN: 14.3 g/dL (ref 13.0–17.1)
LYMPHS PCT: 35 %
Lymphs Abs: 2.7 10*3/uL (ref 0.9–3.3)
MCH: 26.6 pg — ABNORMAL LOW (ref 27.2–33.4)
MCHC: 32.1 g/dL (ref 32.0–36.0)
MCV: 82.9 fL (ref 79.3–98.0)
MONO ABS: 0.5 10*3/uL (ref 0.1–0.9)
Monocytes Relative: 7 %
NEUTROS ABS: 4.3 10*3/uL (ref 1.5–6.5)
Neutrophils Relative %: 56 %
Platelet Count: 233 10*3/uL (ref 140–400)
RBC: 5.37 MIL/uL (ref 4.20–5.82)
RDW: 14.8 % — AB (ref 11.0–14.6)
WBC Count: 7.7 10*3/uL (ref 4.0–10.3)

## 2018-01-22 ENCOUNTER — Other Ambulatory Visit: Payer: Self-pay | Admitting: *Deleted

## 2018-01-22 DIAGNOSIS — C3490 Malignant neoplasm of unspecified part of unspecified bronchus or lung: Secondary | ICD-10-CM

## 2018-01-23 ENCOUNTER — Ambulatory Visit (HOSPITAL_COMMUNITY)
Admission: RE | Admit: 2018-01-23 | Discharge: 2018-01-23 | Disposition: A | Discharge status: Home / Self Care | Payer: Medicare PPO | Source: Ambulatory Visit | Attending: Internal Medicine | Admitting: Internal Medicine

## 2018-01-23 DIAGNOSIS — Z85118 Personal history of other malignant neoplasm of bronchus and lung: Secondary | ICD-10-CM | POA: Diagnosis present

## 2018-01-23 DIAGNOSIS — Z902 Acquired absence of lung [part of]: Secondary | ICD-10-CM | POA: Insufficient documentation

## 2018-01-23 DIAGNOSIS — I700 Atherosclerosis of aorta: Secondary | ICD-10-CM | POA: Insufficient documentation

## 2018-01-23 DIAGNOSIS — R911 Solitary pulmonary nodule: Secondary | ICD-10-CM | POA: Diagnosis not present

## 2018-01-23 DIAGNOSIS — C3432 Malignant neoplasm of lower lobe, left bronchus or lung: Secondary | ICD-10-CM | POA: Diagnosis not present

## 2018-01-23 DIAGNOSIS — J984 Other disorders of lung: Secondary | ICD-10-CM | POA: Diagnosis not present

## 2018-01-23 DIAGNOSIS — C3411 Malignant neoplasm of upper lobe, right bronchus or lung: Secondary | ICD-10-CM

## 2018-01-23 DIAGNOSIS — J439 Emphysema, unspecified: Secondary | ICD-10-CM | POA: Insufficient documentation

## 2018-01-24 ENCOUNTER — Encounter: Payer: Self-pay | Admitting: Internal Medicine

## 2018-01-24 ENCOUNTER — Inpatient Hospital Stay (HOSPITAL_BASED_OUTPATIENT_CLINIC_OR_DEPARTMENT_OTHER): Payer: Medicare PPO | Admitting: Internal Medicine

## 2018-01-24 ENCOUNTER — Telehealth: Payer: Self-pay | Admitting: Internal Medicine

## 2018-01-24 DIAGNOSIS — Z85118 Personal history of other malignant neoplasm of bronchus and lung: Secondary | ICD-10-CM

## 2018-01-24 DIAGNOSIS — I10 Essential (primary) hypertension: Secondary | ICD-10-CM | POA: Diagnosis not present

## 2018-01-24 DIAGNOSIS — C3490 Malignant neoplasm of unspecified part of unspecified bronchus or lung: Secondary | ICD-10-CM

## 2018-01-24 NOTE — Telephone Encounter (Signed)
Scheduled appt per 2/21 los - sent reminder letter in the mail - [facility] to contact patient with ct scan

## 2018-01-24 NOTE — Progress Notes (Signed)
[place] [phone]   [phone]  OFFICE PROGRESS NOTE  Seward Carol, MD [address] [place] [address]  PRINCIPAL DIAGNOSIS: Stage IA (T1a N0 M0) non-small cell lung cancer, adenocarcinoma with bronchoalveolar features diagnosed in May 2010.   PRIOR THERAPY: Status post right upper lobectomy on Apr 30, 2009 under the care of Dr. Arlyce Dice.   CURRENT THERAPY: Observation.  INTERVAL HISTORY: Bobby [name] 72 y.o. male returns to the clinic today for follow-up visit.  The patient was last seen more than 2 years ago.  He was followed by his primary care physician.  He had recent imaging studies including CT scan of the chest that showed suspicious nodule in the left lower lobe of the lung.  He was referred back to me today for evaluation and recommendation regarding this abnormality.  He is feeling fine today with no specific complaints except for anxiety.  He denied having any chest pain, shortness of breath, cough or hemoptysis.  He denied having any fever or chills.  He has no nausea, vomiting, diarrhea or constipation.  He had repeat CT scan of the chest performed recently and he is here for evaluation and discussion of his discuss results.  MEDICAL HISTORY: Past Medical History:  Diagnosis Date  . BPH (benign prostatic hypertrophy)   . Frequency of urination   . History of lung cancer    STAGE 1A NON-SMALL CELL LUNG CARCINOMA ---  04-10-2009   S/P RIGHT UPPER LOBECTOMY , NO CHEMORADIATION--  NO RECURRENCE  (ONCOLOGIST-- DR [facility])  . Hyperlipidemia   . Hypertension   . Pulmonary nodule seen on imaging study    STABLE LEFT LOWER LOBE NODULE  PER CHEST CT--  MONITORED BY DR [facility]  . Type 2 diabetes mellitus ([place])   . Urgency of urination     ALLERGIES:  has No Known Allergies.  MEDICATIONS:  Current Outpatient Medications  Medication Sig Dispense Refill  . allopurinol (ZYLOPRIM) 300 MG tablet Take 300 mg by  mouth daily.    Marland Kitchen amLODipine-benazepril (LOTREL) 5-20 MG per capsule Take 1 capsule by mouth every morning.     Marland Kitchen b complex vitamins tablet Take 1 tablet by mouth daily.    . busPIRone (BUSPAR) 15 MG tablet     . cholecalciferol (VITAMIN D) 1000 UNITS tablet Take 1,000 Units by mouth daily.    . cyclobenzaprine (FLEXERIL) 10 MG tablet Take 1 tablet (10 mg total) by mouth at bedtime as needed for muscle spasms. 12 tablet 0  . dexamethasone (DECADRON) 4 MG tablet Take 4 mg by mouth daily.  0  . escitalopram (LEXAPRO) 20 MG tablet     . glyBURIDE (DIABETA) 5 MG tablet Take 5 mg by mouth daily.    . indomethacin (INDOCIN) 50 MG capsule Take 50 mg by mouth 2 (two) times daily.  3  . naproxen (NAPROSYN) 500 MG tablet Take 1 tablet (500 mg total) by mouth 2 (two) times daily with a meal. 30 tablet 0  . prazosin (MINIPRESS) 2 MG capsule     . ramipril (ALTACE) 1.25 MG capsule Take 1.25 mg by mouth every morning.     Marland Kitchen SIMCOR 500-20 MG 24 hr tablet Take 1 tablet by mouth At bedtime.     . simvastatin (ZOCOR) 20 MG tablet 1 TABLET IN THE EVENING ONCE A DAY ORALLY 90 DAYS  1  . sitaGLIPtan-metformin (JANUMET) 50-500 MG per tablet Take 1 tablet by mouth  2 (two) times daily with a meal.      No current facility-administered medications for this visit.     SURGICAL HISTORY:  Past Surgical History:  Procedure Laterality Date  . CARDIOVASCULAR STRESS TEST  04-08-2009   NORMAL NUCLEAR STUDY/ NO ISCHEMIA/ EF 60%  . CARPAL TUNNEL RELEASE Right   . KNEE ARTHROSCOPY W/ MENISCECTOMY Left 10-31-2004  . LEFT SHOULDER ARTHROSCOPY/ ACROMIOPLASTY/ DECOMPRESSION/ SYNOVECTOMY  11-20-2007  . TRANSURETHRAL RESECTION OF PROSTATE N/A 11/03/2013   Procedure: TRANSURETHRAL RESECTION OF THE PROSTATE WITH GYRUS INSTRUMENTS;  Surgeon: Franchot Gallo, MD;  Location: [facility];  Service: Urology;  Laterality: N/A;  . VIDEO ASSISTED THORACOSCOPY (VATS)/ LOBECTOMY Right 04-10-2009  DR Arlyce Dice   RIGHT UPPER  LOBECTOMY AND NODE DISSECTION    REVIEW OF SYSTEMS:  A comprehensive review of systems was negative.   PHYSICAL EXAMINATION: General appearance: alert, cooperative and no distress Head: Normocephalic, without obvious abnormality, atraumatic Neck: no adenopathy, supple, symmetrical, trachea midline and thyroid not enlarged, symmetric, no tenderness/mass/nodules Lymph nodes: Cervical, supraclavicular, and axillary nodes normal. Resp: clear to auscultation bilaterally and normal percussion bilaterally Back: symmetric, no curvature. ROM normal. No CVA tenderness. Cardio: regular rate and rhythm, S1, S2 normal, no murmur, click, rub or gallop GI: soft, non-tender; bowel sounds normal; no masses,  no organomegaly Extremities: extremities normal, atraumatic, no cyanosis or edema  ECOG PERFORMANCE STATUS: 1 - Symptomatic but completely ambulatory  Blood pressure (!) 163/77, pulse 83, temperature 98.7 F (37.1 C), temperature source Oral, resp. rate 20, height 5\' 11"  (1.803 m), weight 228 lb 4.8 oz (103.6 kg), SpO2 100 %.  LABORATORY DATA: Lab Results  Component Value Date   WBC 7.7 01/18/2018   HGB 13.7 08/24/2015   HCT 44.5 01/18/2018   MCV 82.9 01/18/2018   PLT 233 01/18/2018      Chemistry      Component Value Date/Time   NA 140 01/18/2018 1041   NA 142 08/24/2015 1100   K 4.2 01/18/2018 1041   K 4.0 08/24/2015 1100   CL 106 01/18/2018 1041   CL 108 (H) 02/11/2013 0811   CO2 25 01/18/2018 1041   CO2 26 08/24/2015 1100   BUN 17 01/18/2018 1041   BUN 10.2 08/24/2015 1100   CREATININE 1.12 01/18/2018 1041   CREATININE 0.8 08/24/2015 1100      Component Value Date/Time   CALCIUM 9.7 01/18/2018 1041   CALCIUM 9.0 08/24/2015 1100   ALKPHOS 60 01/18/2018 1041   ALKPHOS 63 08/24/2015 1100   AST 22 01/18/2018 1041   AST 29 08/24/2015 1100   ALT 25 01/18/2018 1041   ALT 41 08/24/2015 1100   BILITOT 0.3 01/18/2018 1041   BILITOT 0.25 08/24/2015 1100       RADIOGRAPHIC  STUDIES: Ct Chest Wo Contrast  Result Date: 01/23/2018 CLINICAL DATA:  Lung cancer, left lower lobe nodule. EXAM: CT CHEST WITHOUT CONTRAST TECHNIQUE: Multidetector CT imaging of the chest was performed following the standard protocol without IV contrast. COMPARISON:  10/17/2017, 08/25/2016, 08/24/2015, 02/11/2013, 11/22/2009 and PET 07/02/2012. FINDINGS: Cardiovascular: Atherosclerotic calcification of the arterial vasculature, including aortic valve. Heart size normal. No pericardial effusion. Mediastinum/Nodes: Mediastinal lymph nodes are not enlarged by CT size criteria. Hilar regions are difficult to evaluate without IV contrast. No axillary adenopathy. Esophagus is grossly unremarkable. Lungs/Pleura: Right upper lobectomy with pleuroparenchymal scarring in lower right hemithorax. Moderate centrilobular emphysema. A 1.0 x 1.6 cm nodule in the left lower lobe (series 7, image 83) is likely stable  from baseline examination of 11/22/2009, when differences in slice collimation are considered. It was shown to be absent of abnormal FDG uptake on 07/02/2012. Additional pulmonary nodules measure 4 mm or less in size, unchanged and benign. No pleural fluid. Airway is unremarkable. Upper Abdomen: Visualized portions of the liver and right adrenal gland are unremarkable. Nodular thickening of the left adrenal gland, as before. Visualized portions of the kidneys, spleen, pancreas, stomach and bowel are grossly unremarkable. No upper abdominal adenopathy. Musculoskeletal: Degenerative changes in the spine. IMPRESSION: 1. Left lower lobe nodule is likely stable from baseline examination of 11/22/2009 and was shown to be absent of abnormal FDG uptake on 07/02/2012. There are differences in slice collimation, however. Repeat PET should be performed in further evaluation, as clinically indicated. 2. Right upper lobectomy with pleuroparenchymal scarring in the right hemithorax. 3.  Aortic atherosclerosis (ICD10-170.0). 4.   Emphysema (ICD10-J43.9). Electronically Signed   By: Lorin Picket M.D.   On: 01/23/2018 13:34    ASSESSMENT AND PLAN: This is a very pleasant 72 years old [place] American male with stage IA non-small cell lung cancer status post right upper lobectomy and has been observation since May of 2010 daily with no evidence for disease recurrence. He had repeat CT scan of the chest performed recently.  The scan showed no concerning abnormality except for the stable left lower lobe pulmonary nodule that has been there since 2010 and had no hypermetabolic activity on previous PET scan. I discussed the scan results with the patient today.  I recommended for him to continue in observation with repeat CT scan of the chest in 3 months for reevaluation of this nodule.  I would consider him for repeat PET scan if there is any concerning enlargement or change of this nodule. For hypertension, he will continue with his current blood pressure medication and monitor it closely at home. He was advised to call immediately if he has any concerning symptoms in the interval. The patient voices understanding of current disease status and treatment options and is in agreement with the current care plan.  All questions were answered. The patient knows to call the clinic with any problems, questions or concerns. We can certainly see the patient much sooner if necessary.  Disclaimer: This note was dictated with voice recognition software. Similar sounding words can inadvertently be transcribed and may be missed upon review.

## 2018-02-27 DIAGNOSIS — E291 Testicular hypofunction: Secondary | ICD-10-CM | POA: Diagnosis not present

## 2018-02-27 DIAGNOSIS — R948 Abnormal results of function studies of other organs and systems: Secondary | ICD-10-CM | POA: Diagnosis not present

## 2018-04-03 DIAGNOSIS — H25013 Cortical age-related cataract, bilateral: Secondary | ICD-10-CM | POA: Diagnosis not present

## 2018-04-03 DIAGNOSIS — H43811 Vitreous degeneration, right eye: Secondary | ICD-10-CM | POA: Diagnosis not present

## 2018-04-03 DIAGNOSIS — H1131 Conjunctival hemorrhage, right eye: Secondary | ICD-10-CM | POA: Diagnosis not present

## 2018-04-03 DIAGNOSIS — H2513 Age-related nuclear cataract, bilateral: Secondary | ICD-10-CM | POA: Diagnosis not present

## 2018-04-19 ENCOUNTER — Inpatient Hospital Stay: Payer: Medicare PPO | Attending: Internal Medicine

## 2018-04-19 DIAGNOSIS — Z85118 Personal history of other malignant neoplasm of bronchus and lung: Secondary | ICD-10-CM | POA: Diagnosis not present

## 2018-04-19 DIAGNOSIS — C3490 Malignant neoplasm of unspecified part of unspecified bronchus or lung: Secondary | ICD-10-CM

## 2018-04-19 LAB — CBC WITH DIFFERENTIAL (CANCER CENTER ONLY)
BASOS ABS: 0 10*3/uL (ref 0.0–0.1)
BASOS PCT: 0 %
EOS PCT: 1 %
Eosinophils Absolute: 0.1 10*3/uL (ref 0.0–0.5)
HCT: 45.1 % (ref 38.4–49.9)
Hemoglobin: 14.4 g/dL (ref 13.0–17.1)
LYMPHS ABS: 2.4 10*3/uL (ref 0.9–3.3)
LYMPHS PCT: 32 %
MCH: 26.6 pg — ABNORMAL LOW (ref 27.2–33.4)
MCHC: 31.9 g/dL — ABNORMAL LOW (ref 32.0–36.0)
MCV: 83.4 fL (ref 79.3–98.0)
MONO ABS: 0.5 10*3/uL (ref 0.1–0.9)
Monocytes Relative: 6 %
Neutro Abs: 4.7 10*3/uL (ref 1.5–6.5)
Neutrophils Relative %: 61 %
PLATELETS: 236 10*3/uL (ref 140–400)
RBC: 5.41 MIL/uL (ref 4.20–5.82)
RDW: 15.8 % — AB (ref 11.0–14.6)
WBC: 7.6 10*3/uL (ref 4.0–10.3)

## 2018-04-19 LAB — CMP (CANCER CENTER ONLY)
ALBUMIN: 3.8 g/dL (ref 3.5–5.0)
ALT: 24 U/L (ref 0–55)
AST: 23 U/L (ref 5–34)
Alkaline Phosphatase: 59 U/L (ref 40–150)
Anion gap: 7 (ref 3–11)
BUN: 15 mg/dL (ref 7–26)
CHLORIDE: 105 mmol/L (ref 98–109)
CO2: 28 mmol/L (ref 22–29)
Calcium: 9.4 mg/dL (ref 8.4–10.4)
Creatinine: 1.08 mg/dL (ref 0.70–1.30)
GFR, Est AFR Am: >60 mL/min (ref >60)
Glucose, Bld: 97 mg/dL (ref 70–140)
POTASSIUM: 4.5 mmol/L (ref 3.5–5.1)
SODIUM: 140 mmol/L (ref 136–145)
Total Bilirubin: 0.4 mg/dL (ref 0.2–1.2)
Total Protein: 7 g/dL (ref 6.4–8.3)

## 2018-04-22 ENCOUNTER — Encounter (HOSPITAL_COMMUNITY): Payer: Self-pay

## 2018-04-22 ENCOUNTER — Ambulatory Visit (HOSPITAL_COMMUNITY)
Admission: RE | Admit: 2018-04-22 | Discharge: 2018-04-22 | Disposition: A | Discharge status: Home / Self Care | Payer: Medicare PPO | Source: Ambulatory Visit | Attending: Internal Medicine | Admitting: Internal Medicine

## 2018-04-22 DIAGNOSIS — C3490 Malignant neoplasm of unspecified part of unspecified bronchus or lung: Secondary | ICD-10-CM | POA: Diagnosis not present

## 2018-04-22 DIAGNOSIS — Z902 Acquired absence of lung [part of]: Secondary | ICD-10-CM | POA: Diagnosis not present

## 2018-04-22 DIAGNOSIS — J439 Emphysema, unspecified: Secondary | ICD-10-CM | POA: Insufficient documentation

## 2018-04-22 MED ORDER — IOHEXOL 300 MG/ML  SOLN
75.0000 mL | Freq: Once | INTRAMUSCULAR | Status: AC | PRN
  Administered 2018-04-22: 75 mL via INTRAVENOUS

## 2018-04-23 ENCOUNTER — Encounter: Payer: Self-pay | Admitting: Internal Medicine

## 2018-04-23 ENCOUNTER — Inpatient Hospital Stay (HOSPITAL_BASED_OUTPATIENT_CLINIC_OR_DEPARTMENT_OTHER): Payer: Medicare PPO | Admitting: Internal Medicine

## 2018-04-23 ENCOUNTER — Telehealth: Payer: Self-pay | Admitting: Internal Medicine

## 2018-04-23 DIAGNOSIS — C3490 Malignant neoplasm of unspecified part of unspecified bronchus or lung: Secondary | ICD-10-CM

## 2018-04-23 DIAGNOSIS — Z85118 Personal history of other malignant neoplasm of bronchus and lung: Secondary | ICD-10-CM | POA: Diagnosis not present

## 2018-04-23 NOTE — Progress Notes (Signed)
[place] [phone]   [phone]  OFFICE PROGRESS NOTE  Seward Carol, MD 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   PRIOR THERAPY: Status post right upper lobectomy on Apr 30, 2009 under the care of Dr. Arlyce Dice.   CURRENT THERAPY: Observation.  INTERVAL HISTORY: Bobby [name] 72 y.o. male returns to the clinic today for follow-up visit.  The patient is feeling fine with no complaints.  He is very active and working in his yard.  He denied having any chest pain, shortness of breath, cough or hemoptysis.  He denied having any recent weight loss or night sweats.  He has no nausea, vomiting, diarrhea or constipation.  He is here today for evaluation with repeat CT scan of the chest.  MEDICAL HISTORY: Past Medical History:  Diagnosis Date  . BPH (benign prostatic hypertrophy)   . Frequency of urination   . History of lung cancer    STAGE 1A NON-SMALL CELL LUNG CARCINOMA ---  04-10-2009   S/P RIGHT UPPER LOBECTOMY , NO CHEMORADIATION--  NO RECURRENCE  (ONCOLOGIST-- DR [facility])  . Hyperlipidemia   . Hypertension   . Pulmonary nodule seen on imaging study    STABLE LEFT LOWER LOBE NODULE  PER CHEST CT--  MONITORED BY DR [facility]  . Type 2 diabetes mellitus ([place])   . Urgency of urination     ALLERGIES:  has No Known Allergies.  MEDICATIONS:  Current Outpatient Medications  Medication Sig Dispense Refill  . allopurinol (ZYLOPRIM) 300 MG tablet Take 300 mg by mouth daily.    Marland Kitchen amLODipine-benazepril (LOTREL) 5-20 MG per capsule Take 1 capsule by mouth every morning.     Marland Kitchen b complex vitamins tablet Take 1 tablet by mouth daily.    . busPIRone (BUSPAR) 15 MG tablet     . cholecalciferol (VITAMIN D) 1000 UNITS tablet Take 1,000 Units by mouth daily.    Marland Kitchen escitalopram (LEXAPRO) 20 MG tablet       . glyBURIDE (DIABETA) 5 MG tablet Take 5 mg by mouth daily.    . indomethacin (INDOCIN) 50 MG capsule Take 50 mg by mouth 2 (two) times daily.  3  . naproxen (NAPROSYN) 500 MG tablet Take 1 tablet (500 mg total) by mouth 2 (two) times daily with a meal. 30 tablet 0  . prazosin (MINIPRESS) 2 MG capsule     . ramipril (ALTACE) 1.25 MG capsule Take 1.25 mg by mouth every morning.     Marland Kitchen SIMCOR 500-20 MG 24 hr tablet Take 1 tablet by mouth At bedtime.     . simvastatin (ZOCOR) 20 MG tablet 1 TABLET IN THE EVENING ONCE A DAY ORALLY 90 DAYS  1  . sitaGLIPtan-metformin (JANUMET) 50-500 MG per tablet Take 1 tablet by mouth 2 (two) times daily with a meal.      No current facility-administered medications for this visit.     SURGICAL HISTORY:  Past Surgical History:  Procedure Laterality Date  . CARDIOVASCULAR STRESS TEST  04-08-2009   NORMAL NUCLEAR STUDY/ NO ISCHEMIA/ EF 60%  . CARPAL TUNNEL RELEASE Right   . KNEE ARTHROSCOPY W/ MENISCECTOMY Left 10-31-2004  . LEFT SHOULDER ARTHROSCOPY/ ACROMIOPLASTY/ DECOMPRESSION/ SYNOVECTOMY  11-20-2007  . TRANSURETHRAL RESECTION OF PROSTATE N/A 11/03/2013   Procedure: TRANSURETHRAL RESECTION OF THE PROSTATE WITH GYRUS INSTRUMENTS;  Surgeon: Franchot Gallo, MD;  Location: [facility];  Service: Urology;  Laterality: N/A;  . VIDEO ASSISTED THORACOSCOPY (VATS)/ LOBECTOMY Right 04-10-2009  DR Arlyce Dice   RIGHT UPPER LOBECTOMY AND NODE DISSECTION    REVIEW OF SYSTEMS:  A comprehensive review of systems was negative.   PHYSICAL EXAMINATION: General appearance: alert, cooperative and no distress Head: Normocephalic, without obvious abnormality, atraumatic Neck: no adenopathy, supple, symmetrical, trachea midline and thyroid not enlarged, symmetric, no tenderness/mass/nodules Lymph nodes: Cervical, supraclavicular, and axillary nodes normal. Resp: clear to auscultation bilaterally and normal percussion bilaterally Back: symmetric, no curvature.  ROM normal. No CVA tenderness. Cardio: regular rate and rhythm, S1, S2 normal, no murmur, click, rub or gallop GI: soft, non-tender; bowel sounds normal; no masses,  no organomegaly Extremities: extremities normal, atraumatic, no cyanosis or edema  ECOG PERFORMANCE STATUS: 0 - Asymptomatic  Blood pressure (!) 148/79, pulse 65, temperature 98.4 F (36.9 C), temperature source Oral, resp. rate 17, height 5\' 11"  (1.803 m), weight 220 lb 6.4 oz (100 kg), SpO2 100 %.  LABORATORY DATA: Lab Results  Component Value Date   WBC 7.6 04/19/2018   HGB 14.4 04/19/2018   HCT 45.1 04/19/2018   MCV 83.4 04/19/2018   PLT 236 04/19/2018      Chemistry      Component Value Date/Time   NA 140 04/19/2018 1027   NA 142 08/24/2015 1100   K 4.5 04/19/2018 1027   K 4.0 08/24/2015 1100   CL 105 04/19/2018 1027   CL 108 (H) 02/11/2013 0811   CO2 28 04/19/2018 1027   CO2 26 08/24/2015 1100   BUN 15 04/19/2018 1027   BUN 10.2 08/24/2015 1100   CREATININE 1.08 04/19/2018 1027   CREATININE 0.8 08/24/2015 1100      Component Value Date/Time   CALCIUM 9.4 04/19/2018 1027   CALCIUM 9.0 08/24/2015 1100   ALKPHOS 59 04/19/2018 1027   ALKPHOS 63 08/24/2015 1100   AST 23 04/19/2018 1027   AST 29 08/24/2015 1100   ALT 24 04/19/2018 1027   ALT 41 08/24/2015 1100   BILITOT 0.4 04/19/2018 1027   BILITOT 0.25 08/24/2015 1100       RADIOGRAPHIC STUDIES: Ct Chest W Contrast  Result Date: 04/22/2018 CLINICAL DATA:  Non-small-cell lung cancer. EXAM: CT CHEST WITH CONTRAST TECHNIQUE: Multidetector CT imaging of the chest was performed during intravenous contrast administration. CONTRAST:  66mL OMNIPAQUE IOHEXOL 300 MG/ML  SOLN COMPARISON:  01/23/2018 FINDINGS: Cardiovascular: The heart size is normal. No pericardial effusion. No thoracic aortic aneurysm. Mediastinum/Nodes: No mediastinal lymphadenopathy. There is no hilar lymphadenopathy. The esophagus has normal imaging features. Lungs/Pleura: Postsurgical  scarring again noted right lung, stable in the interval. The 10 x 16 mm nodule in the left lower lobe identified on the prior study measures 15 x 15 mm today. The lesion has irregular margins, lobular contour, and is non confluent. Qualitatively, this lesion shows no substantial change in the 3 month interval since prior study. Comparing back to earlier studies as old as 08/09/2013, the lesion appears grossly similar in overall size although differential slice thickness hinders assessment. This lesion demonstrated no hypermetabolism on PET-CT of 07/02/2012. Several additional scattered tiny pulmonary nodules are stable. Upper Abdomen: Unremarkable. Musculoskeletal: Bone windows reveal no worrisome lytic or sclerotic osseous lesions. IMPRESSION: 1. Left lower lobe pulmonary nodule without substantial interval change since 08/19/2013 and no evidence for hypermetabolism on PET-CT of 07/02/2012. 2. Status post right upper lobectomy. No evidence for new or progressive disease in the right lung is  suggest recurrence. 3.  Emphysema. ([id number]) Electronically Signed   By: Misty Stanley M.D.   On: 04/22/2018 14:36    ASSESSMENT AND PLAN: This is a very pleasant 72 years old [place] American male with stage IA non-small cell lung cancer status post right upper lobectomy and has been observation since May of 2010 daily with no evidence for disease recurrence. Repeat CT scan of the chest showed the persistent left lower lobe pulmonary nodule with no significant change since 2014.  This nodule was not hypermetabolic on the PET scan performed in July 2013. I discussed the scan results with the patient today and recommended for him to continue in observation with repeat CT scan of the chest in 6 months. If this nodule showed any concerning progression, I may refer the patient to cardiothoracic surgery for evaluation and consideration of repeat biopsy or resection. He was advised to call immediately if he has any  concerning symptoms in the interval. The patient voices understanding of current disease status and treatment options and is in agreement with the current care plan.  All questions were answered. The patient knows to call the clinic with any problems, questions or concerns. We can certainly see the patient much sooner if necessary.  Disclaimer: This note was dictated with voice recognition software. Similar sounding words can inadvertently be transcribed and may be missed upon review.

## 2018-04-23 NOTE — Telephone Encounter (Signed)
Appointments scheduled AVS/Calendar printed , number for Radiology scheduling was given to patient as well per 5/21 los

## 2018-04-24 DIAGNOSIS — I10 Essential (primary) hypertension: Secondary | ICD-10-CM | POA: Diagnosis not present

## 2018-04-24 DIAGNOSIS — M109 Gout, unspecified: Secondary | ICD-10-CM | POA: Diagnosis not present

## 2018-04-24 DIAGNOSIS — E1169 Type 2 diabetes mellitus with other specified complication: Secondary | ICD-10-CM | POA: Diagnosis not present

## 2018-04-24 DIAGNOSIS — E7800 Pure hypercholesterolemia, unspecified: Secondary | ICD-10-CM | POA: Diagnosis not present

## 2018-05-09 ENCOUNTER — Telehealth: Payer: Self-pay | Admitting: *Deleted

## 2018-05-09 NOTE — Telephone Encounter (Signed)
Faxed ROI to [facility]; release [phone]

## 2018-05-16 DIAGNOSIS — H1131 Conjunctival hemorrhage, right eye: Secondary | ICD-10-CM | POA: Diagnosis not present

## 2018-05-16 DIAGNOSIS — H33041 Retinal detachment with retinal dialysis, right eye: Secondary | ICD-10-CM | POA: Diagnosis not present

## 2018-05-16 DIAGNOSIS — H43811 Vitreous degeneration, right eye: Secondary | ICD-10-CM | POA: Diagnosis not present

## 2018-05-16 DIAGNOSIS — S0591XA Unspecified injury of right eye and orbit, initial encounter: Secondary | ICD-10-CM | POA: Diagnosis not present

## 2018-05-17 ENCOUNTER — Encounter (INDEPENDENT_AMBULATORY_CARE_PROVIDER_SITE_OTHER): Payer: Medicare PPO | Admitting: Ophthalmology

## 2018-05-17 DIAGNOSIS — I10 Essential (primary) hypertension: Secondary | ICD-10-CM

## 2018-05-17 DIAGNOSIS — H2513 Age-related nuclear cataract, bilateral: Secondary | ICD-10-CM | POA: Diagnosis not present

## 2018-05-17 DIAGNOSIS — H35033 Hypertensive retinopathy, bilateral: Secondary | ICD-10-CM

## 2018-05-17 DIAGNOSIS — H33023 Retinal detachment with multiple breaks, bilateral: Secondary | ICD-10-CM | POA: Diagnosis not present

## 2018-05-17 DIAGNOSIS — H43813 Vitreous degeneration, bilateral: Secondary | ICD-10-CM

## 2018-05-22 ENCOUNTER — Encounter (INDEPENDENT_AMBULATORY_CARE_PROVIDER_SITE_OTHER): Payer: Self-pay | Admitting: Ophthalmology

## 2018-05-23 ENCOUNTER — Encounter: Payer: Self-pay | Admitting: Gastroenterology

## 2018-05-23 DIAGNOSIS — M25531 Pain in right wrist: Secondary | ICD-10-CM | POA: Diagnosis not present

## 2018-05-23 DIAGNOSIS — M65352 Trigger finger, left little finger: Secondary | ICD-10-CM | POA: Diagnosis not present

## 2018-05-23 DIAGNOSIS — M65342 Trigger finger, left ring finger: Secondary | ICD-10-CM | POA: Diagnosis not present

## 2018-05-23 DIAGNOSIS — M65341 Trigger finger, right ring finger: Secondary | ICD-10-CM | POA: Diagnosis not present

## 2018-05-23 DIAGNOSIS — M65331 Trigger finger, right middle finger: Secondary | ICD-10-CM | POA: Diagnosis not present

## 2018-05-23 DIAGNOSIS — M65351 Trigger finger, right little finger: Secondary | ICD-10-CM | POA: Diagnosis not present

## 2018-05-23 DIAGNOSIS — M19131 Post-traumatic osteoarthritis, right wrist: Secondary | ICD-10-CM | POA: Diagnosis not present

## 2018-05-27 ENCOUNTER — Encounter (INDEPENDENT_AMBULATORY_CARE_PROVIDER_SITE_OTHER): Payer: Medicare PPO | Admitting: Ophthalmology

## 2018-05-27 DIAGNOSIS — H33022 Retinal detachment with multiple breaks, left eye: Secondary | ICD-10-CM

## 2018-06-03 ENCOUNTER — Encounter (INDEPENDENT_AMBULATORY_CARE_PROVIDER_SITE_OTHER): Payer: Medicare PPO | Admitting: Ophthalmology

## 2018-06-03 DIAGNOSIS — H33303 Unspecified retinal break, bilateral: Secondary | ICD-10-CM

## 2018-06-12 DIAGNOSIS — M659 Synovitis and tenosynovitis, unspecified: Secondary | ICD-10-CM | POA: Diagnosis not present

## 2018-06-12 DIAGNOSIS — M19131 Post-traumatic osteoarthritis, right wrist: Secondary | ICD-10-CM | POA: Diagnosis not present

## 2018-06-12 DIAGNOSIS — M65341 Trigger finger, right ring finger: Secondary | ICD-10-CM | POA: Diagnosis not present

## 2018-07-06 DIAGNOSIS — Z7984 Long term (current) use of oral hypoglycemic drugs: Secondary | ICD-10-CM | POA: Diagnosis not present

## 2018-07-06 DIAGNOSIS — Z8546 Personal history of malignant neoplasm of prostate: Secondary | ICD-10-CM | POA: Diagnosis not present

## 2018-07-06 DIAGNOSIS — I10 Essential (primary) hypertension: Secondary | ICD-10-CM | POA: Diagnosis not present

## 2018-07-06 DIAGNOSIS — M1990 Unspecified osteoarthritis, unspecified site: Secondary | ICD-10-CM | POA: Diagnosis not present

## 2018-07-06 DIAGNOSIS — Z6831 Body mass index (BMI) 31.0-31.9, adult: Secondary | ICD-10-CM | POA: Diagnosis not present

## 2018-07-06 DIAGNOSIS — M109 Gout, unspecified: Secondary | ICD-10-CM | POA: Diagnosis not present

## 2018-07-06 DIAGNOSIS — E119 Type 2 diabetes mellitus without complications: Secondary | ICD-10-CM | POA: Diagnosis not present

## 2018-07-06 DIAGNOSIS — E785 Hyperlipidemia, unspecified: Secondary | ICD-10-CM | POA: Diagnosis not present

## 2018-07-08 ENCOUNTER — Encounter (INDEPENDENT_AMBULATORY_CARE_PROVIDER_SITE_OTHER): Payer: Medicare PPO | Admitting: Ophthalmology

## 2018-07-08 DIAGNOSIS — H33023 Retinal detachment with multiple breaks, bilateral: Secondary | ICD-10-CM

## 2018-07-09 DIAGNOSIS — R310 Gross hematuria: Secondary | ICD-10-CM | POA: Diagnosis not present

## 2018-07-09 DIAGNOSIS — N401 Enlarged prostate with lower urinary tract symptoms: Secondary | ICD-10-CM | POA: Diagnosis not present

## 2018-07-15 DIAGNOSIS — C678 Malignant neoplasm of overlapping sites of bladder: Secondary | ICD-10-CM | POA: Diagnosis not present

## 2018-07-15 DIAGNOSIS — E291 Testicular hypofunction: Secondary | ICD-10-CM | POA: Diagnosis not present

## 2018-07-17 DIAGNOSIS — M109 Gout, unspecified: Secondary | ICD-10-CM | POA: Diagnosis not present

## 2018-07-19 ENCOUNTER — Encounter: Payer: Self-pay | Admitting: Gastroenterology

## 2018-07-22 DIAGNOSIS — M25631 Stiffness of right wrist, not elsewhere classified: Secondary | ICD-10-CM | POA: Diagnosis not present

## 2018-07-22 DIAGNOSIS — M19131 Post-traumatic osteoarthritis, right wrist: Secondary | ICD-10-CM | POA: Diagnosis not present

## 2018-07-22 DIAGNOSIS — M65331 Trigger finger, right middle finger: Secondary | ICD-10-CM | POA: Diagnosis not present

## 2018-07-22 DIAGNOSIS — M65351 Trigger finger, right little finger: Secondary | ICD-10-CM | POA: Diagnosis not present

## 2018-07-22 DIAGNOSIS — M25641 Stiffness of right hand, not elsewhere classified: Secondary | ICD-10-CM | POA: Diagnosis not present

## 2018-07-22 DIAGNOSIS — M79644 Pain in right finger(s): Secondary | ICD-10-CM | POA: Diagnosis not present

## 2018-07-22 DIAGNOSIS — M79641 Pain in right hand: Secondary | ICD-10-CM | POA: Diagnosis not present

## 2018-07-22 DIAGNOSIS — M25531 Pain in right wrist: Secondary | ICD-10-CM | POA: Diagnosis not present

## 2018-07-30 DIAGNOSIS — M25641 Stiffness of right hand, not elsewhere classified: Secondary | ICD-10-CM | POA: Diagnosis not present

## 2018-07-30 DIAGNOSIS — M19131 Post-traumatic osteoarthritis, right wrist: Secondary | ICD-10-CM | POA: Diagnosis not present

## 2018-07-30 DIAGNOSIS — M25531 Pain in right wrist: Secondary | ICD-10-CM | POA: Diagnosis not present

## 2018-07-30 DIAGNOSIS — M25631 Stiffness of right wrist, not elsewhere classified: Secondary | ICD-10-CM | POA: Diagnosis not present

## 2018-08-02 DIAGNOSIS — M25531 Pain in right wrist: Secondary | ICD-10-CM | POA: Diagnosis not present

## 2018-08-02 DIAGNOSIS — M25641 Stiffness of right hand, not elsewhere classified: Secondary | ICD-10-CM | POA: Diagnosis not present

## 2018-08-02 DIAGNOSIS — R29898 Other symptoms and signs involving the musculoskeletal system: Secondary | ICD-10-CM | POA: Diagnosis not present

## 2018-08-02 DIAGNOSIS — M79641 Pain in right hand: Secondary | ICD-10-CM | POA: Diagnosis not present

## 2018-08-02 DIAGNOSIS — M19131 Post-traumatic osteoarthritis, right wrist: Secondary | ICD-10-CM | POA: Diagnosis not present

## 2018-08-02 DIAGNOSIS — M25631 Stiffness of right wrist, not elsewhere classified: Secondary | ICD-10-CM | POA: Diagnosis not present

## 2018-08-08 DIAGNOSIS — M109 Gout, unspecified: Secondary | ICD-10-CM | POA: Diagnosis not present

## 2018-08-08 DIAGNOSIS — E119 Type 2 diabetes mellitus without complications: Secondary | ICD-10-CM | POA: Diagnosis not present

## 2018-08-12 DIAGNOSIS — Z8551 Personal history of malignant neoplasm of bladder: Secondary | ICD-10-CM | POA: Diagnosis not present

## 2018-08-12 DIAGNOSIS — Z85118 Personal history of other malignant neoplasm of bronchus and lung: Secondary | ICD-10-CM | POA: Diagnosis not present

## 2018-08-12 DIAGNOSIS — Z87891 Personal history of nicotine dependence: Secondary | ICD-10-CM | POA: Diagnosis not present

## 2018-08-12 DIAGNOSIS — R911 Solitary pulmonary nodule: Secondary | ICD-10-CM | POA: Diagnosis not present

## 2018-08-23 DIAGNOSIS — L02439 Carbuncle of limb, unspecified: Secondary | ICD-10-CM | POA: Diagnosis not present

## 2018-08-23 DIAGNOSIS — J410 Simple chronic bronchitis: Secondary | ICD-10-CM | POA: Diagnosis not present

## 2018-09-09 ENCOUNTER — Telehealth: Payer: Self-pay | Admitting: *Deleted

## 2018-09-09 NOTE — Telephone Encounter (Signed)
Patient no show PV today, called pt. No answer, left message for pt to return my call to reschedule the nurse visit before 5 pm or the colonoscopy would be cancelled.

## 2018-09-10 ENCOUNTER — Other Ambulatory Visit: Payer: Self-pay | Admitting: Surgery

## 2018-09-10 DIAGNOSIS — L723 Sebaceous cyst: Secondary | ICD-10-CM | POA: Diagnosis not present

## 2018-09-10 DIAGNOSIS — L089 Local infection of the skin and subcutaneous tissue, unspecified: Secondary | ICD-10-CM | POA: Diagnosis not present

## 2018-09-11 ENCOUNTER — Encounter: Payer: Self-pay | Admitting: Gastroenterology

## 2018-09-20 ENCOUNTER — Ambulatory Visit (AMBULATORY_SURGERY_CENTER): Payer: Self-pay

## 2018-09-20 VITALS — Ht 70.0 in | Wt 220.2 lb

## 2018-09-20 DIAGNOSIS — Z86010 Personal history of colonic polyps: Secondary | ICD-10-CM

## 2018-09-20 NOTE — Progress Notes (Signed)
No egg or soy allergy known to patient  No issues with past sedation with any surgeries  or procedures, no intubation problems  No diet pills per patient No home 02 use per patient  No blood thinners per patient  Pt denies issues with constipation  No A fib or A flutter  EMMI video sent to pt's e mail  Pt. declined 

## 2018-09-23 ENCOUNTER — Encounter: Payer: Medicare PPO | Admitting: Gastroenterology

## 2018-09-25 ENCOUNTER — Encounter (HOSPITAL_BASED_OUTPATIENT_CLINIC_OR_DEPARTMENT_OTHER): Payer: Self-pay | Admitting: *Deleted

## 2018-09-30 ENCOUNTER — Encounter (HOSPITAL_BASED_OUTPATIENT_CLINIC_OR_DEPARTMENT_OTHER): Payer: Self-pay | Admitting: *Deleted

## 2018-09-30 ENCOUNTER — Other Ambulatory Visit: Payer: Self-pay

## 2018-10-02 ENCOUNTER — Encounter (HOSPITAL_BASED_OUTPATIENT_CLINIC_OR_DEPARTMENT_OTHER)
Admission: RE | Admit: 2018-10-02 | Discharge: 2018-10-02 | Disposition: A | Discharge status: Home / Self Care | Payer: Medicare PPO | Source: Ambulatory Visit | Attending: Surgery | Admitting: Surgery

## 2018-10-02 DIAGNOSIS — Z79899 Other long term (current) drug therapy: Secondary | ICD-10-CM | POA: Diagnosis not present

## 2018-10-02 DIAGNOSIS — I10 Essential (primary) hypertension: Secondary | ICD-10-CM | POA: Diagnosis not present

## 2018-10-02 DIAGNOSIS — G4730 Sleep apnea, unspecified: Secondary | ICD-10-CM | POA: Diagnosis not present

## 2018-10-02 DIAGNOSIS — J449 Chronic obstructive pulmonary disease, unspecified: Secondary | ICD-10-CM | POA: Diagnosis not present

## 2018-10-02 DIAGNOSIS — L720 Epidermal cyst: Secondary | ICD-10-CM | POA: Diagnosis not present

## 2018-10-02 DIAGNOSIS — Z6831 Body mass index (BMI) 31.0-31.9, adult: Secondary | ICD-10-CM | POA: Diagnosis not present

## 2018-10-02 DIAGNOSIS — Z811 Family history of alcohol abuse and dependence: Secondary | ICD-10-CM | POA: Diagnosis not present

## 2018-10-02 DIAGNOSIS — E119 Type 2 diabetes mellitus without complications: Secondary | ICD-10-CM | POA: Diagnosis not present

## 2018-10-02 DIAGNOSIS — E669 Obesity, unspecified: Secondary | ICD-10-CM | POA: Diagnosis not present

## 2018-10-02 DIAGNOSIS — L723 Sebaceous cyst: Secondary | ICD-10-CM | POA: Diagnosis not present

## 2018-10-02 DIAGNOSIS — Z87891 Personal history of nicotine dependence: Secondary | ICD-10-CM | POA: Diagnosis not present

## 2018-10-02 DIAGNOSIS — Z8261 Family history of arthritis: Secondary | ICD-10-CM | POA: Diagnosis not present

## 2018-10-02 DIAGNOSIS — N400 Enlarged prostate without lower urinary tract symptoms: Secondary | ICD-10-CM | POA: Diagnosis not present

## 2018-10-02 DIAGNOSIS — Z8249 Family history of ischemic heart disease and other diseases of the circulatory system: Secondary | ICD-10-CM | POA: Diagnosis not present

## 2018-10-02 DIAGNOSIS — Z833 Family history of diabetes mellitus: Secondary | ICD-10-CM | POA: Diagnosis not present

## 2018-10-02 DIAGNOSIS — Z85118 Personal history of other malignant neoplasm of bronchus and lung: Secondary | ICD-10-CM | POA: Diagnosis not present

## 2018-10-02 LAB — BASIC METABOLIC PANEL
ANION GAP: 8 (ref 5–15)
BUN: 11 mg/dL (ref 8–23)
CHLORIDE: 108 mmol/L (ref 98–111)
CO2: 24 mmol/L (ref 22–32)
Calcium: 9.2 mg/dL (ref 8.9–10.3)
Creatinine, Ser: 0.88 mg/dL (ref 0.61–1.24)
GFR calc Af Amer: >60 mL/min (ref >60)
GFR calc non Af Amer: >60 mL/min (ref >60)
Glucose, Bld: 99 mg/dL (ref 70–99)
POTASSIUM: 4.4 mmol/L (ref 3.5–5.1)
Sodium: 140 mmol/L (ref 135–145)

## 2018-10-02 NOTE — Progress Notes (Signed)
Ensure presurgery drink given with instructions to complete by 0700 dos, surgical soap given with instructions, pt verbalized understanding.

## 2018-10-02 NOTE — H&P (Signed)
Bobby [name] Documented: 09/10/2018 2:03 PM Location: [facility] Patient #: [id number] DOB: January 16, 1946 Married / Language: English / Race: Black or African American Male   History of Present Illness [name] MD; 09/10/2018 2:17 PM) The patient is a 72 year old male who presents with skin lesions. 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 The patient reports he has had a per some time. Recently became infected and draining on its own. He reports drain purulent material. He was placed on antibiotics and it has improved. He does longer has drainage or discomfort. He is otherwise without complaints. He is a stage I lung cancer survivor. He is otherwise without complaint. He denies fevers or chills.   Past Surgical History [name]; 09/10/2018 2:04 PM) Knee Surgery  Right. Shoulder Surgery  Right. TURP   Diagnostic Studies History [name]; 09/10/2018 2:04 PM) Colonoscopy  5-10 years ago  Allergies [name]; 09/10/2018 2:05 PM) No Known Drug Allergies [09/10/2018]: Allergies Reconciled   Medication History [name]; 09/10/2018 2:07 PM) Allopurinol (100MG  Tablet, Oral) Active. Simvastatin (20MG  Tablet, Oral) Active. glyBURIDE (5MG  Tablet, Oral) Active. SITagliptin-metFORMIN HCl ER (50-1000MG  Tablet ER 24HR, Oral) Active. Medications Reconciled  Social History [name]; 09/10/2018 2:04 PM) Alcohol use  Occasional alcohol use. Caffeine use  Coffee. Illicit drug use  Remotely quit drug use. Tobacco use  Former smoker.  Family History [name]; 09/10/2018 2:04 PM) Alcohol Abuse  Daughter. Arthritis  Father. Diabetes Mellitus  Mother. Heart Disease  Brother. Hypertension  Father.  Other Problems [name]; 09/10/2018 2:04 PM) Diabetes Mellitus  Enlarged Prostate  High blood pressure  Lung Cancer  Sleep Apnea      Review of Systems [name]; 09/10/2018 2:04 PM) General Not Present- Appetite Loss, Chills, Fatigue, Fever, Night Sweats, Weight Gain and Weight Loss. Skin Present- Change in Wart/Mole. Not Present- Dryness, Hives, Jaundice, New Lesions, Non-Healing Wounds, Rash and Ulcer. HEENT Not Present- Earache, Hearing Loss, Hoarseness, Nose Bleed, Oral Ulcers, Ringing in the Ears, Seasonal Allergies, Sinus Pain, Sore Throat, Visual Disturbances, Wears glasses/contact lenses and Yellow Eyes. Respiratory Not Present- Bloody sputum, Chronic Cough, Difficulty Breathing, Snoring and Wheezing. Breast Not Present- Breast Mass, Breast Pain, Nipple Discharge and Skin Changes. Cardiovascular Not Present- Chest Pain, Difficulty Breathing Lying Down, Leg Cramps, Palpitations, Rapid Heart Rate, Shortness of Breath and Swelling of Extremities. Gastrointestinal Not Present- Abdominal Pain, Bloating, Bloody Stool, Change in Bowel Habits, Chronic diarrhea, Constipation, Difficulty Swallowing, Excessive gas, Gets full quickly at meals, Hemorrhoids, Indigestion, Nausea, Rectal Pain and Vomiting. Male Genitourinary Not Present- Blood in Urine, Change in Urinary Stream, Frequency, Impotence, Nocturia, Painful Urination, Urgency and Urine Leakage. Musculoskeletal Not Present- Back Pain, Joint Pain, Joint Stiffness, Muscle Pain, Muscle Weakness and Swelling of Extremities. Neurological Not Present- Decreased Memory, Fainting, Headaches, Numbness, Seizures, Tingling, Tremor, Trouble walking and Weakness. Psychiatric Not Present- Anxiety, Bipolar, Change in Sleep Pattern, Depression, Fearful and Frequent crying. Endocrine Not Present- Cold Intolerance, Excessive Hunger, Hair Changes, Heat Intolerance, Hot flashes and New Diabetes. Hematology Not Present- Blood Thinners, Easy Bruising, Excessive bleeding, Gland problems, HIV and Persistent Infections.  Vitals [name]; 09/10/2018 2:08 PM) 09/10/2018 2:07 PM Weight:  220.13 lb Height: 70in Body Surface Area: 2.17 m Body Mass Index: 31.58 kg/m  Temp.: 98.17F(Oral)  Pulse: 81 (Regular)  BP: 132/84 (Sitting, Left Arm, Standard)       Physical Exam (Cindra Austad A. Ninfa Linden MD; 09/10/2018 2:17 PM)  General Mental Status-Alert. General Appearance-Consistent with stated age. Hydration-Well hydrated. Voice-Normal.  Integumentary Note: There is a 2 cm sebaceous cyst in the lateral left axilla toward the back. There are chronic skin changes and some induration but no purulence or erythema   Head and Neck Head-normocephalic, atraumatic with no lesions or palpable masses. Trachea-midline. Thyroid Gland Characteristics - normal size and consistency.  Eye Eyeball - Bilateral-Extraocular movements intact. Sclera/Conjunctiva - Bilateral-No scleral icterus.  Chest and Lung Exam Chest and lung exam reveals -quiet, even and easy respiratory effort with no use of accessory muscles and on auscultation, normal breath sounds, no adventitious sounds and normal vocal resonance. Inspection Chest Wall - Normal. Back - normal.  Breast - Did not examine.  Cardiovascular Cardiovascular examination reveals -normal heart sounds, regular rate and rhythm with no murmurs and normal pedal pulses bilaterally.  Abdomen Inspection Inspection of the abdomen reveals - No Hernias. Skin - Scar - no surgical scars. Palpation/Percussion Palpation and Percussion of the abdomen reveal - Soft, Non Tender, No Rebound tenderness, No Rigidity (guarding) and No hepatosplenomegaly. Auscultation Auscultation of the abdomen reveals - Bowel sounds normal.  Neurologic - Did not examine.  Musculoskeletal - Did not examine.  Lymphatic Head & Neck  General Head & Neck Lymphatics: Bilateral - Description - Normal. Axillary  General Axillary Region: Bilateral - Description - Normal. Tenderness - Non Tender. Femoral & Inguinal - Did not  examine.    Assessment & Plan (Mayumi Summerson A. Ninfa Linden MD; 09/10/2018 2:18 PM) INFECTED SEBACEOUS CYST (L72.3) Impression: Because of its previous infection, it is recommended to remove the sebaceous cyst. I discussed the diagnosis of the sebaceous cyst with him in detail. We discussed the reasoning for surgery and recurrence. I discussed the surgical procedure of removal of the cyst. I discussed the risks which includes but is not limited to bleeding, infection, injury to surrounding structures, recurrence, postoperative recovery, etc.

## 2018-10-03 ENCOUNTER — Encounter (HOSPITAL_BASED_OUTPATIENT_CLINIC_OR_DEPARTMENT_OTHER)
Admission: RE | Disposition: A | Discharge status: Home / Self Care | Payer: Self-pay | Source: Ambulatory Visit | Attending: Surgery

## 2018-10-03 ENCOUNTER — Encounter (HOSPITAL_BASED_OUTPATIENT_CLINIC_OR_DEPARTMENT_OTHER): Payer: Self-pay | Admitting: Anesthesiology

## 2018-10-03 ENCOUNTER — Ambulatory Visit (HOSPITAL_BASED_OUTPATIENT_CLINIC_OR_DEPARTMENT_OTHER): Payer: Medicare PPO | Admitting: Anesthesiology

## 2018-10-03 ENCOUNTER — Other Ambulatory Visit: Payer: Self-pay

## 2018-10-03 ENCOUNTER — Ambulatory Visit (HOSPITAL_BASED_OUTPATIENT_CLINIC_OR_DEPARTMENT_OTHER)
Admission: RE | Admit: 2018-10-03 | Discharge: 2018-10-03 | Disposition: A | Discharge status: Home / Self Care | Payer: Medicare PPO | Source: Ambulatory Visit | Attending: Surgery | Admitting: Surgery

## 2018-10-03 DIAGNOSIS — Z833 Family history of diabetes mellitus: Secondary | ICD-10-CM | POA: Insufficient documentation

## 2018-10-03 DIAGNOSIS — E669 Obesity, unspecified: Secondary | ICD-10-CM | POA: Insufficient documentation

## 2018-10-03 DIAGNOSIS — G4730 Sleep apnea, unspecified: Secondary | ICD-10-CM | POA: Insufficient documentation

## 2018-10-03 DIAGNOSIS — E119 Type 2 diabetes mellitus without complications: Secondary | ICD-10-CM | POA: Diagnosis not present

## 2018-10-03 DIAGNOSIS — L720 Epidermal cyst: Secondary | ICD-10-CM | POA: Diagnosis not present

## 2018-10-03 DIAGNOSIS — J449 Chronic obstructive pulmonary disease, unspecified: Secondary | ICD-10-CM | POA: Insufficient documentation

## 2018-10-03 DIAGNOSIS — I10 Essential (primary) hypertension: Secondary | ICD-10-CM | POA: Diagnosis not present

## 2018-10-03 DIAGNOSIS — N400 Enlarged prostate without lower urinary tract symptoms: Secondary | ICD-10-CM | POA: Diagnosis not present

## 2018-10-03 DIAGNOSIS — Z8261 Family history of arthritis: Secondary | ICD-10-CM | POA: Insufficient documentation

## 2018-10-03 DIAGNOSIS — L723 Sebaceous cyst: Secondary | ICD-10-CM | POA: Insufficient documentation

## 2018-10-03 DIAGNOSIS — Z811 Family history of alcohol abuse and dependence: Secondary | ICD-10-CM | POA: Insufficient documentation

## 2018-10-03 DIAGNOSIS — Z8249 Family history of ischemic heart disease and other diseases of the circulatory system: Secondary | ICD-10-CM | POA: Insufficient documentation

## 2018-10-03 DIAGNOSIS — Z85118 Personal history of other malignant neoplasm of bronchus and lung: Secondary | ICD-10-CM | POA: Insufficient documentation

## 2018-10-03 DIAGNOSIS — Z6831 Body mass index (BMI) 31.0-31.9, adult: Secondary | ICD-10-CM | POA: Insufficient documentation

## 2018-10-03 DIAGNOSIS — Z87891 Personal history of nicotine dependence: Secondary | ICD-10-CM | POA: Insufficient documentation

## 2018-10-03 DIAGNOSIS — Z79899 Other long term (current) drug therapy: Secondary | ICD-10-CM | POA: Insufficient documentation

## 2018-10-03 HISTORY — DX: Sleep apnea, unspecified: G47.30

## 2018-10-03 HISTORY — PX: CYST EXCISION: SHX5701

## 2018-10-03 HISTORY — DX: Chronic obstructive pulmonary disease, unspecified: J44.9

## 2018-10-03 LAB — GLUCOSE, CAPILLARY
GLUCOSE-CAPILLARY: 105 mg/dL — AB (ref 70–99)
Glucose-Capillary: 104 mg/dL — ABNORMAL HIGH (ref 70–99)

## 2018-10-03 SURGERY — CYST REMOVAL
Anesthesia: Monitor Anesthesia Care | Site: Axilla | Laterality: Left

## 2018-10-03 MED ORDER — FENTANYL CITRATE (PF) 100 MCG/2ML IJ SOLN
50.0000 ug | INTRAMUSCULAR | Status: DC | PRN
  Administered 2018-10-03: 50 ug via INTRAVENOUS

## 2018-10-03 MED ORDER — LIDOCAINE-EPINEPHRINE (PF) 1 %-1:200000 IJ SOLN
INTRAMUSCULAR | Status: DC | PRN
  Administered 2018-10-03: 12 mL

## 2018-10-03 MED ORDER — CEFAZOLIN SODIUM-DEXTROSE 2-4 GM/100ML-% IV SOLN
2.0000 g | INTRAVENOUS | Status: AC
  Administered 2018-10-03: 2 g via INTRAVENOUS

## 2018-10-03 MED ORDER — FENTANYL CITRATE (PF) 100 MCG/2ML IJ SOLN
INTRAMUSCULAR | Status: AC
  Filled 2018-10-03: qty 2

## 2018-10-03 MED ORDER — GABAPENTIN 300 MG PO CAPS
ORAL_CAPSULE | ORAL | Status: AC
  Filled 2018-10-03: qty 1

## 2018-10-03 MED ORDER — SCOPOLAMINE 1 MG/3DAYS TD PT72: 1.0000 | MEDICATED_PATCH | Freq: Once | TRANSDERMAL | Status: DC | PRN

## 2018-10-03 MED ORDER — GABAPENTIN 300 MG PO CAPS
300.0000 mg | ORAL_CAPSULE | ORAL | Status: AC
  Administered 2018-10-03: 300 mg via ORAL

## 2018-10-03 MED ORDER — LIDOCAINE 2% (20 MG/ML) 5 ML SYRINGE
INTRAMUSCULAR | Status: DC | PRN
  Administered 2018-10-03: 100 mg via INTRAVENOUS

## 2018-10-03 MED ORDER — LACTATED RINGERS IV SOLN
INTRAVENOUS | Status: DC
  Administered 2018-10-03: 10:00:00 via INTRAVENOUS

## 2018-10-03 MED ORDER — MIDAZOLAM HCL 2 MG/2ML IJ SOLN: 1.0000 mg | INTRAMUSCULAR | Status: DC | PRN

## 2018-10-03 MED ORDER — LIDOCAINE-EPINEPHRINE 1 %-1:100000 IJ SOLN
INTRAMUSCULAR | Status: AC
  Filled 2018-10-03: qty 1

## 2018-10-03 MED ORDER — ACETAMINOPHEN 500 MG PO TABS
ORAL_TABLET | ORAL | Status: AC
  Filled 2018-10-03: qty 2

## 2018-10-03 MED ORDER — LIDOCAINE 2% (20 MG/ML) 5 ML SYRINGE
INTRAMUSCULAR | Status: AC
  Filled 2018-10-03: qty 5

## 2018-10-03 MED ORDER — CEFAZOLIN SODIUM-DEXTROSE 2-4 GM/100ML-% IV SOLN
INTRAVENOUS | Status: AC
  Filled 2018-10-03: qty 100

## 2018-10-03 MED ORDER — LIDOCAINE-EPINEPHRINE (PF) 1 %-1:200000 IJ SOLN
INTRAMUSCULAR | Status: AC
  Filled 2018-10-03: qty 30

## 2018-10-03 MED ORDER — FENTANYL CITRATE (PF) 100 MCG/2ML IJ SOLN: 25.0000 ug | INTRAMUSCULAR | Status: DC | PRN

## 2018-10-03 MED ORDER — PROPOFOL 500 MG/50ML IV EMUL
INTRAVENOUS | Status: AC
  Filled 2018-10-03: qty 50

## 2018-10-03 MED ORDER — CHLORHEXIDINE GLUCONATE CLOTH 2 % EX PADS: 6.0000 | MEDICATED_PAD | Freq: Once | CUTANEOUS | Status: DC

## 2018-10-03 MED ORDER — ONDANSETRON HCL 4 MG/2ML IJ SOLN: 4.0000 mg | Freq: Once | INTRAMUSCULAR | Status: DC | PRN

## 2018-10-03 MED ORDER — HYDROCODONE-ACETAMINOPHEN 7.5-325 MG PO TABS: 1.0000 | ORAL_TABLET | Freq: Once | ORAL | Status: DC | PRN

## 2018-10-03 MED ORDER — TRAMADOL HCL 50 MG PO TABS: 50.0000 mg | ORAL_TABLET | Freq: Four times a day (QID) | ORAL | 0 refills | Status: DC | PRN

## 2018-10-03 MED ORDER — ONDANSETRON HCL 4 MG/2ML IJ SOLN
INTRAMUSCULAR | Status: AC
  Filled 2018-10-03: qty 2

## 2018-10-03 MED ORDER — ACETAMINOPHEN 500 MG PO TABS
1000.0000 mg | ORAL_TABLET | ORAL | Status: AC
  Administered 2018-10-03: 1000 mg via ORAL

## 2018-10-03 MED ORDER — ONDANSETRON HCL 4 MG/2ML IJ SOLN
INTRAMUSCULAR | Status: DC | PRN
  Administered 2018-10-03: 4 mg via INTRAVENOUS

## 2018-10-03 MED ORDER — PROPOFOL 500 MG/50ML IV EMUL
INTRAVENOUS | Status: DC | PRN
  Administered 2018-10-03: 75 ug/kg/min via INTRAVENOUS

## 2018-10-03 SURGICAL SUPPLY — 40 items
ADH SKN CLS APL DERMABOND .7 (GAUZE/BANDAGES/DRESSINGS) ×2
BLADE CLIPPER SURG (BLADE) IMPLANT
BLADE HEX COATED 2.75 (ELECTRODE) ×3 IMPLANT
BLADE SURG 15 STRL LF DISP TIS (BLADE) ×2 IMPLANT
BLADE SURG 15 STRL SS (BLADE) ×3
CANISTER SUCT 1200ML W/VALVE (MISCELLANEOUS) ×3 IMPLANT
CHLORAPREP W/TINT 26ML (MISCELLANEOUS) ×3 IMPLANT
COVER BACK TABLE 60X90IN (DRAPES) ×3 IMPLANT
COVER MAYO STAND STRL (DRAPES) ×3 IMPLANT
COVER WAND RF STERILE (DRAPES) IMPLANT
DECANTER SPIKE VIAL GLASS SM (MISCELLANEOUS) IMPLANT
DERMABOND ADVANCED (GAUZE/BANDAGES/DRESSINGS) ×1
DERMABOND ADVANCED .7 DNX12 (GAUZE/BANDAGES/DRESSINGS) ×2 IMPLANT
DRAPE LAPAROTOMY 100X72 PEDS (DRAPES) ×3 IMPLANT
DRAPE UTILITY XL STRL (DRAPES) ×3 IMPLANT
ELECT REM PT RETURN 9FT ADLT (ELECTROSURGICAL) ×3
ELECTRODE REM PT RTRN 9FT ADLT (ELECTROSURGICAL) ×2 IMPLANT
GLOVE SURG SIGNA 7.5 PF LTX (GLOVE) ×3 IMPLANT
GOWN STRL REUS W/ TWL LRG LVL3 (GOWN DISPOSABLE) ×2 IMPLANT
GOWN STRL REUS W/ TWL XL LVL3 (GOWN DISPOSABLE) ×2 IMPLANT
GOWN STRL REUS W/TWL LRG LVL3 (GOWN DISPOSABLE) ×3
GOWN STRL REUS W/TWL XL LVL3 (GOWN DISPOSABLE) ×3
NDL HYPO 25X1 1.5 SAFETY (NEEDLE) ×1 IMPLANT
NEEDLE HYPO 25X1 1.5 SAFETY (NEEDLE) ×3 IMPLANT
NS IRRIG 1000ML POUR BTL (IV SOLUTION) ×3 IMPLANT
PACK BASIN DAY SURGERY FS (CUSTOM PROCEDURE TRAY) ×3 IMPLANT
PENCIL BUTTON HOLSTER BLD 10FT (ELECTRODE) ×3 IMPLANT
SLEEVE SCD COMPRESS KNEE MED (MISCELLANEOUS) ×2 IMPLANT
SPONGE LAP 4X18 RFD (DISPOSABLE) ×3 IMPLANT
SUT ETHILON 2 0 FS 18 (SUTURE) IMPLANT
SUT ETHILON 3 0 FSL (SUTURE) IMPLANT
SUT MNCRL AB 4-0 PS2 18 (SUTURE) ×2 IMPLANT
SUT VIC AB 3-0 SH 27 (SUTURE) ×3
SUT VIC AB 3-0 SH 27X BRD (SUTURE) ×1 IMPLANT
SYR BULB 3OZ (MISCELLANEOUS) ×3 IMPLANT
SYR CONTROL 10ML LL (SYRINGE) ×3 IMPLANT
TOWEL GREEN STERILE FF (TOWEL DISPOSABLE) ×3 IMPLANT
TOWEL OR NON WOVEN STRL DISP B (DISPOSABLE) ×3 IMPLANT
TUBE CONNECTING 20X1/4 (TUBING) ×3 IMPLANT
YANKAUER SUCT BULB TIP NO VENT (SUCTIONS) ×3 IMPLANT

## 2018-10-03 NOTE — Transfer of Care (Signed)
Immediate Anesthesia Transfer of Care Note  Patient: Bobby [name]  Procedure(s) Performed: EXCISION OF LEFT AXILLARY SEBACEOUS CYST (Left )  Patient Location: PACU  Anesthesia Type:MAC  Level of Consciousness: sedated  Airway & Oxygen Therapy: Patient Spontanous Breathing and Patient connected to face mask oxygen  Post-op Assessment: Report given to RN and Post -op Vital signs reviewed and stable  Post vital signs: Reviewed and stable  Last Vitals:  Vitals Value Taken Time  BP    Temp    Pulse 56 10/03/2018 11:26 AM  Resp    SpO2 100 % 10/03/2018 11:26 AM  Vitals shown include unvalidated device data.  Last Pain:  Vitals:   10/03/18 1005  TempSrc: Oral  PainSc: 0-No pain         Complications: No apparent anesthesia complications

## 2018-10-03 NOTE — Anesthesia Postprocedure Evaluation (Signed)
Anesthesia Post Note  Patient: Bobby [name]  Procedure(s) Performed: EXCISION OF LEFT AXILLARY SEBACEOUS CYST (Left )     Patient location during evaluation: PACU Anesthesia Type: MAC Level of consciousness: awake and alert and oriented Pain management: pain level controlled Vital Signs Assessment: post-procedure vital signs reviewed and stable Respiratory status: spontaneous breathing, nonlabored ventilation and respiratory function stable Cardiovascular status: stable and blood pressure returned to baseline Postop Assessment: no apparent nausea or vomiting Anesthetic complications: no    Last Vitals:  Vitals:   10/03/18 1145 10/03/18 1150  BP: 123/81   Pulse: (!) 59 (!) 57  Resp: 16 16  Temp:    SpO2: 100% 100%    Last Pain:  Vitals:   10/03/18 1150  TempSrc:   PainSc: 0-No pain                 Terita Hejl A.

## 2018-10-03 NOTE — Anesthesia Preprocedure Evaluation (Signed)
Anesthesia Evaluation  Patient identified by MRN, date of birth, ID band Patient awake    Reviewed: Allergy & Precautions, NPO status , Patient's Chart, lab work & pertinent test results  Airway Mallampati: II  TM Distance: >3 FB Neck ROM: Full    Dental  (+) Partial Upper, Missing, Poor Dentition, Caps   Pulmonary sleep apnea , COPD, former smoker,  Hx/o Lung Ca RUL   Pulmonary exam normal breath sounds clear to auscultation       Cardiovascular hypertension, Pt. on medications Normal cardiovascular exam Rhythm:Regular Rate:Normal     Neuro/Psych negative neurological ROS  negative psych ROS   GI/Hepatic negative GI ROS, Neg liver ROS,   Endo/Other  diabetes, Well Controlled, Type 2, Oral Hypoglycemic AgentsHyperlipidemia Obesity  Renal/GU negative Renal ROS   BPH    Musculoskeletal Sebaceous Cyst left axilla   Abdominal (+) + obese,   Peds  Hematology negative hematology ROS (+)   Anesthesia Other Findings   Reproductive/Obstetrics                             Anesthesia Physical Anesthesia Plan  ASA: III  Anesthesia Plan: MAC   Post-op Pain Management:    Induction: Intravenous  PONV Risk Score and Plan: 1 and Ondansetron and Treatment may vary due to age or medical condition  Airway Management Planned: Natural Airway, Simple Face Mask and Nasal Cannula  Additional Equipment:   Intra-op Plan:   Post-operative Plan:   Informed Consent: I have reviewed the patients History and Physical, chart, labs and discussed the procedure including the risks, benefits and alternatives for the proposed anesthesia with the patient or authorized representative who has indicated his/her understanding and acceptance.   Dental advisory given  Plan Discussed with: CRNA and Surgeon  Anesthesia Plan Comments:         Anesthesia Quick Evaluation

## 2018-10-03 NOTE — Discharge Instructions (Signed)
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  No vigorous activity for one week    Post Anesthesia Home Care Instructions  Activity: Get plenty of rest for the remainder of the day. A responsible individual must stay with you for 24 hours following the procedure.  For the next 24 hours, DO NOT: -Drive a car -Paediatric nurse -Drink alcoholic beverages -Take any medication unless instructed by your physician -Make any legal decisions or sign important papers.  Meals: Start with liquid foods such as gelatin or soup. Progress to regular foods as tolerated. Avoid greasy, spicy, heavy foods. If nausea and/or vomiting occur, drink only clear liquids until the nausea and/or vomiting subsides. Call your physician if vomiting continues.  Special Instructions/Symptoms: Your throat may feel dry or sore from the anesthesia or the breathing tube placed in your throat during surgery. If this causes discomfort, gargle with warm salt water. The discomfort should disappear within 24 hours.  If you had a scopolamine patch placed behind your ear for the management of post- operative nausea and/or vomiting:  1. The medication in the patch is effective for 72 hours, after which it should be removed.  Wrap patch in a tissue and discard in the trash. Wash hands thoroughly with soap and water. 2. You may remove the patch earlier than 72 hours if you experience unpleasant side effects which may include dry mouth, dizziness or visual disturbances. 3. Avoid touching the patch. Wash your hands with soap and water after contact with the patch.

## 2018-10-03 NOTE — Op Note (Signed)
EXCISION OF LEFT AXILLARY SEBACEOUS CYST  Procedure Note  Bobby [name] 10/03/2018   Pre-op Diagnosis: EXCISION LEFT AXILLARY SEBACEOUS CYST     Post-op Diagnosis: same  Procedure(s): EXCISION OF LEFT AXILLARY SEBACEOUS CYST (2 cm )  Surgeon(s): Coralie Keens, MD  Anesthesia: Monitor Anesthesia Care  Staff:  Circulator: Eston Esters, RN; Renae Gloss, RN Scrub Person: Maurene Capes, RN  Estimated Blood Loss: Minimal               Specimens: sent to path  Procedure: The patient was brought to the operating room and identified as correct patient.  He was placed upon the operating table and anesthesia was induced.  His left axilla was then prepped and draped in usual sterile fashion.  I anesthetized the skin surrounding the palpable cyst with lidocaine.  I then made an elliptical incision with a scalpel.  I then excised the cyst overlying skin completely with electrocautery.  This was sent to pathology for evaluation.  I achieved hemostasis with the cautery.  I then closed the subcutaneous tissue with interrupted 3-0 Vicryl sutures and closed the skin with a running 4-0 Monocryl suture.  Dermabond was then applied.  The patient tolerated the procedure well.  All the counts were correct at the end of the procedure.  The patient was then taken in a stable condition from the operating room to the recovery room.          Bobby [name] A   Date: 10/03/2018  Time: 11:21 AM

## 2018-10-03 NOTE — Interval H&P Note (Signed)
History and Physical Interval Note:no change in h and p  10/03/2018 10:37 AM  Bobby [name]  has presented today for surgery, with the diagnosis of EXCISION LEFT AXILLARY SEBACEOUS CYST  The various methods of treatment have been discussed with the patient and family. After consideration of risks, benefits and other options for treatment, the patient has consented to  Procedure(s): EXCISION OF LEFT AXILLARY SEBACEOUS CYST (Left) as a surgical intervention .  [name] history has been reviewed, patient examined, no change in status, stable for surgery.  I have reviewed the patient's chart and labs.  Questions were answered to the patient's satisfaction.     Laural Eiland A

## 2018-10-03 NOTE — Anesthesia Procedure Notes (Signed)
Procedure Name: MAC Date/Time: 10/03/2018 10:57 AM Performed by: Lyndee Leo, CRNA Pre-anesthesia Checklist: Patient identified, Timeout performed, Emergency Drugs available, Suction available and Patient being monitored Patient Re-evaluated:Patient Re-evaluated prior to induction Oxygen Delivery Method: Simple face mask

## 2018-10-04 ENCOUNTER — Encounter (HOSPITAL_BASED_OUTPATIENT_CLINIC_OR_DEPARTMENT_OTHER): Payer: Self-pay | Admitting: Surgery

## 2018-10-09 ENCOUNTER — Ambulatory Visit (AMBULATORY_SURGERY_CENTER): Payer: Medicare PPO | Admitting: Gastroenterology

## 2018-10-09 ENCOUNTER — Encounter: Payer: Self-pay | Admitting: Gastroenterology

## 2018-10-09 VITALS — BP 115/67 | HR 62 | Temp 96.6°F | Resp 14 | Ht 71.0 in | Wt 220.0 lb

## 2018-10-09 DIAGNOSIS — D123 Benign neoplasm of transverse colon: Secondary | ICD-10-CM

## 2018-10-09 DIAGNOSIS — D122 Benign neoplasm of ascending colon: Secondary | ICD-10-CM

## 2018-10-09 DIAGNOSIS — Z86010 Personal history of colonic polyps: Secondary | ICD-10-CM | POA: Diagnosis not present

## 2018-10-09 DIAGNOSIS — D125 Benign neoplasm of sigmoid colon: Secondary | ICD-10-CM

## 2018-10-09 DIAGNOSIS — D124 Benign neoplasm of descending colon: Secondary | ICD-10-CM

## 2018-10-09 DIAGNOSIS — Z1211 Encounter for screening for malignant neoplasm of colon: Secondary | ICD-10-CM | POA: Diagnosis not present

## 2018-10-09 MED ORDER — SODIUM CHLORIDE 0.9 % IV SOLN: 500.0000 mL | Freq: Once | INTRAVENOUS | Status: DC

## 2018-10-09 NOTE — Op Note (Signed)
[place] Patient Name: Bobby [name] Procedure Date: 10/09/2018 7:25 AM MRN: [id number] Endoscopist: Remo Lipps P. Havery Moros , MD Age: 72 Referring MD:  Date of Birth: [birth year] Gender: Male Account #: [id number] Procedure:                Colonoscopy Indications:              High risk colon cancer surveillance: Personal                            history of colonic polyps Medicines:                Monitored Anesthesia Care Procedure:                Pre-Anesthesia Assessment:                           - Prior to the procedure, a History and Physical                            was performed, and patient medications and                            allergies were reviewed. [name] tolerance of                            previous anesthesia was also reviewed. The risks                            and benefits of the procedure and the sedation                            options and risks were discussed with the patient.                            All questions were answered, and informed consent                            was obtained. Prior Anticoagulants: The patient has                            taken no previous anticoagulant or antiplatelet                            agents. ASA Grade Assessment: III - A patient with                            severe systemic disease. After reviewing the risks                            and benefits, the patient was deemed in                            satisfactory condition to undergo the procedure.  After obtaining informed consent, the colonoscope                            was passed under direct vision. Throughout the                            procedure, the patient's blood pressure, pulse, and                            oxygen saturations were monitored continuously. The                            Colonoscope was introduced through the anus and                            advanced to the the cecum,  identified by                            appendiceal orifice and ileocecal valve. The                            colonoscopy was performed without difficulty. The                            patient tolerated the procedure well. The quality                            of the bowel preparation was good. The ileocecal                            valve, appendiceal orifice, and rectum were                            photographed. Scope In: 7:33:37 AM Scope Out: 7:57:52 AM Scope Withdrawal Time: 0 hours 22 minutes 1 second  Total Procedure Duration: 0 hours 24 minutes 15 seconds  Findings:                 The perianal and digital rectal examinations were                            normal.                           Three sessile polyps were found in the ascending                            colon. The polyps were 3 mm in size. These polyps                            were removed with a cold biopsy forceps. Resection                            and retrieval were complete.  Two sessile polyps were found in the ascending                            colon. The polyps were 3 to 4 mm in size. These                            polyps were removed with a cold snare. Resection                            and retrieval were complete.                           A 3 mm polyp was found in the hepatic flexure. The                            polyp was sessile. The polyp was removed with a                            cold snare. Resection and retrieval were complete.                           Eight sessile polyps were found in the transverse                            colon. The polyps were 3 to 5 mm in size. These                            polyps were removed with a cold snare. Resection                            and retrieval were complete.                           Two sessile polyps were found in the descending                            colon. The polyps were 3 to 4 mm in size. These                             polyps were removed with a cold snare. Resection                            and retrieval were complete.                           A 4 mm polyp was found in the sigmoid colon. The                            polyp was sessile. The polyp was removed with a                            cold snare. Resection and  retrieval were complete.                           Multiple small-mouthed diverticula were found in                            the transverse colon and left colon.                           Internal hemorrhoids were found during retroflexion.                           The exam was otherwise without abnormality. Complications:            No immediate complications. Estimated blood loss:                            Minimal. Estimated Blood Loss:     Estimated blood loss was minimal. Impression:               - Three 3 mm polyps in the ascending colon, removed                            with a cold biopsy forceps. Resected and retrieved.                           - Two 3 to 4 mm polyps in the ascending colon,                            removed with a cold snare. Resected and retrieved.                           - One 3 mm polyp at the hepatic flexure, removed                            with a cold snare. Resected and retrieved.                           - Eight 3 to 5 mm polyps in the transverse colon,                            removed with a cold snare. Resected and retrieved.                           - Two 3 to 4 mm polyps in the descending colon,                            removed with a cold snare. Resected and retrieved.                           - One 4 mm polyp in the sigmoid colon, removed with  a cold snare. Resected and retrieved.                           - Diverticulosis in the transverse colon and in the                            left colon.                           - Internal hemorrhoids.                           - The  examination was otherwise normal. Recommendation:           - Patient has a contact number available for                            emergencies. The signs and symptoms of potential                            delayed complications were discussed with the                            patient. Return to normal activities tomorrow.                            Written discharge instructions were provided to the                            patient.                           - Resume previous diet.                           - Continue present medications.                           - Await pathology results.                           - Repeat colonoscopy for surveillance based on                            pathology results. Remo Lipps P. Jatziry Wechter, MD 10/09/2018 8:07:14 AM This report has been signed electronically.

## 2018-10-09 NOTE — Progress Notes (Signed)
Called to room to assist during endoscopic procedure.  Patient ID and intended procedure confirmed with present staff. Received instructions for my participation in the procedure from the performing physician.  

## 2018-10-09 NOTE — Progress Notes (Signed)
A and O x3. Report to RN. Tolerated MAC anesthesia well.

## 2018-10-09 NOTE — Patient Instructions (Signed)
YOU HAD AN ENDOSCOPIC PROCEDURE TODAY AT THE [facility]:   Refer to the procedure report that was given to you for any specific questions about what was found during the examination.  If the procedure report does not answer your questions, please call your gastroenterologist to clarify.  If you requested that your care partner not be given the details of your procedure findings, then the procedure report has been included in a sealed envelope for you to review at your convenience later.  YOU SHOULD EXPECT: Some feelings of bloating in the abdomen. Passage of more gas than usual.  Walking can help get rid of the air that was put into your GI tract during the procedure and reduce the bloating. If you had a lower endoscopy (such as a colonoscopy or flexible sigmoidoscopy) you may notice spotting of blood in your stool or on the toilet paper. If you underwent a bowel prep for your procedure, you may not have a normal bowel movement for a few days.  Please Note:  You might notice some irritation and congestion in your nose or some drainage.  This is from the oxygen used during your procedure.  There is no need for concern and it should clear up in a day or so.  SYMPTOMS TO REPORT IMMEDIATELY:   Following lower endoscopy (colonoscopy or flexible sigmoidoscopy):  Excessive amounts of blood in the stool  Significant tenderness or worsening of abdominal pains  Swelling of the abdomen that is new, acute  Fever of 100F or higher  For urgent or emergent issues, a gastroenterologist can be reached at any hour by calling [phone].   DIET:  We do recommend a small meal at first, but then you may proceed to your regular diet.  Drink plenty of fluids but you should avoid alcoholic beverages for 24 hours.  ACTIVITY:  You should plan to take it easy for the rest of today and you should NOT DRIVE or use heavy machinery until tomorrow (because of the sedation medicines used during the test).     FOLLOW UP: Our staff will call the number listed on your records the next business day following your procedure to check on you and address any questions or concerns that you may have regarding the information given to you following your procedure. If we do not reach you, we will leave a message.  However, if you are feeling well and you are not experiencing any problems, there is no need to return our call.  We will assume that you have returned to your regular daily activities without incident.  If any biopsies were taken you will be contacted by phone or by letter within the next 1-3 weeks.  Please call us at [phone] if you have not heard about the biopsies in 3 weeks.   Await for biopsy results Polyps (handout given) Diverticulosis (handout given) Hemorrhoids (handout given)   SIGNATURES/CONFIDENTIALITY: You and/or your care partner have signed paperwork which will be entered into your electronic medical record.  These signatures attest to the fact that that the information above on your After Visit Summary has been reviewed and is understood.  Full responsibility of the confidentiality of this discharge information lies with you and/or your care-partner. 

## 2018-10-10 ENCOUNTER — Telehealth: Payer: Self-pay

## 2018-10-10 NOTE — Telephone Encounter (Signed)
Attempted to reach patient for post-procedure f/u call. This is the 2nd attempt. No answer. Left message for patient to please not hesitate to call us if he has any questions/concerns regarding his care.

## 2018-10-10 NOTE — Telephone Encounter (Signed)
No answer, left message to call back later today, B.Krist Rosenboom RN. 

## 2018-10-15 DIAGNOSIS — M109 Gout, unspecified: Secondary | ICD-10-CM | POA: Diagnosis not present

## 2018-10-18 DIAGNOSIS — Z902 Acquired absence of lung [part of]: Secondary | ICD-10-CM | POA: Diagnosis not present

## 2018-10-18 DIAGNOSIS — R911 Solitary pulmonary nodule: Secondary | ICD-10-CM | POA: Diagnosis not present

## 2018-10-25 ENCOUNTER — Inpatient Hospital Stay: Payer: No Typology Code available for payment source | Attending: Internal Medicine

## 2018-10-25 ENCOUNTER — Ambulatory Visit (HOSPITAL_COMMUNITY): Payer: Medicare PPO

## 2018-10-25 DIAGNOSIS — Z9889 Other specified postprocedural states: Secondary | ICD-10-CM | POA: Diagnosis not present

## 2018-10-25 DIAGNOSIS — R918 Other nonspecific abnormal finding of lung field: Secondary | ICD-10-CM | POA: Diagnosis not present

## 2018-10-25 DIAGNOSIS — Z85118 Personal history of other malignant neoplasm of bronchus and lung: Secondary | ICD-10-CM | POA: Diagnosis not present

## 2018-10-25 DIAGNOSIS — Z87891 Personal history of nicotine dependence: Secondary | ICD-10-CM | POA: Diagnosis not present

## 2018-10-25 DIAGNOSIS — R911 Solitary pulmonary nodule: Secondary | ICD-10-CM | POA: Diagnosis not present

## 2018-10-25 DIAGNOSIS — R9389 Abnormal findings on diagnostic imaging of other specified body structures: Secondary | ICD-10-CM | POA: Diagnosis not present

## 2018-10-29 ENCOUNTER — Inpatient Hospital Stay: Payer: No Typology Code available for payment source | Admitting: Internal Medicine

## 2018-11-05 DIAGNOSIS — Z0000 Encounter for general adult medical examination without abnormal findings: Secondary | ICD-10-CM | POA: Diagnosis not present

## 2018-11-05 DIAGNOSIS — Z85118 Personal history of other malignant neoplasm of bronchus and lung: Secondary | ICD-10-CM | POA: Diagnosis not present

## 2018-11-05 DIAGNOSIS — M109 Gout, unspecified: Secondary | ICD-10-CM | POA: Diagnosis not present

## 2018-11-05 DIAGNOSIS — Z1389 Encounter for screening for other disorder: Secondary | ICD-10-CM | POA: Diagnosis not present

## 2018-11-05 DIAGNOSIS — I10 Essential (primary) hypertension: Secondary | ICD-10-CM | POA: Diagnosis not present

## 2018-11-05 DIAGNOSIS — R911 Solitary pulmonary nodule: Secondary | ICD-10-CM | POA: Diagnosis not present

## 2018-11-05 DIAGNOSIS — E1169 Type 2 diabetes mellitus with other specified complication: Secondary | ICD-10-CM | POA: Diagnosis not present

## 2018-11-05 DIAGNOSIS — E7800 Pure hypercholesterolemia, unspecified: Secondary | ICD-10-CM | POA: Diagnosis not present

## 2018-11-05 DIAGNOSIS — Z23 Encounter for immunization: Secondary | ICD-10-CM | POA: Diagnosis not present

## 2018-11-25 ENCOUNTER — Encounter (INDEPENDENT_AMBULATORY_CARE_PROVIDER_SITE_OTHER): Payer: Medicare PPO | Admitting: Ophthalmology

## 2018-11-29 DIAGNOSIS — D485 Neoplasm of uncertain behavior of skin: Secondary | ICD-10-CM | POA: Diagnosis not present

## 2018-11-29 DIAGNOSIS — D1801 Hemangioma of skin and subcutaneous tissue: Secondary | ICD-10-CM | POA: Diagnosis not present

## 2018-12-16 ENCOUNTER — Encounter (INDEPENDENT_AMBULATORY_CARE_PROVIDER_SITE_OTHER): Payer: Medicare PPO | Admitting: Ophthalmology

## 2019-01-02 DIAGNOSIS — R0981 Nasal congestion: Secondary | ICD-10-CM | POA: Diagnosis not present

## 2019-01-02 DIAGNOSIS — Z85118 Personal history of other malignant neoplasm of bronchus and lung: Secondary | ICD-10-CM | POA: Diagnosis not present

## 2019-01-02 DIAGNOSIS — R911 Solitary pulmonary nodule: Secondary | ICD-10-CM | POA: Diagnosis not present

## 2019-01-13 ENCOUNTER — Inpatient Hospital Stay: Payer: Medicare PPO

## 2019-01-13 ENCOUNTER — Encounter: Payer: Self-pay | Admitting: Internal Medicine

## 2019-01-13 ENCOUNTER — Telehealth: Payer: Self-pay | Admitting: Internal Medicine

## 2019-01-13 ENCOUNTER — Inpatient Hospital Stay: Payer: Medicare PPO | Attending: Internal Medicine | Admitting: Internal Medicine

## 2019-01-13 ENCOUNTER — Other Ambulatory Visit: Payer: Self-pay

## 2019-01-13 VITALS — BP 130/75 | HR 69 | Temp 98.4°F | Resp 20 | Ht 71.0 in | Wt 227.0 lb

## 2019-01-13 DIAGNOSIS — C3490 Malignant neoplasm of unspecified part of unspecified bronchus or lung: Secondary | ICD-10-CM

## 2019-01-13 DIAGNOSIS — Z85118 Personal history of other malignant neoplasm of bronchus and lung: Secondary | ICD-10-CM

## 2019-01-13 DIAGNOSIS — C3411 Malignant neoplasm of upper lobe, right bronchus or lung: Secondary | ICD-10-CM

## 2019-01-13 LAB — CMP (CANCER CENTER ONLY)
ALBUMIN: 3.6 g/dL (ref 3.5–5.0)
ALT: 29 U/L (ref 0–44)
AST: 24 U/L (ref 15–41)
Alkaline Phosphatase: 67 U/L (ref 38–126)
Anion gap: 9 (ref 5–15)
BUN: 14 mg/dL (ref 8–23)
CHLORIDE: 108 mmol/L (ref 98–111)
CO2: 25 mmol/L (ref 22–32)
CREATININE: 1.16 mg/dL (ref 0.61–1.24)
Calcium: 9.2 mg/dL (ref 8.9–10.3)
GFR, Est AFR Am: >60 mL/min (ref >60)
GFR, Estimated: >60 mL/min (ref >60)
Glucose, Bld: 200 mg/dL — ABNORMAL HIGH (ref 70–99)
POTASSIUM: 4.1 mmol/L (ref 3.5–5.1)
SODIUM: 142 mmol/L (ref 135–145)
Total Bilirubin: <0.2 mg/dL — ABNORMAL LOW (ref 0.3–1.2)
Total Protein: 6.9 g/dL (ref 6.5–8.1)

## 2019-01-13 LAB — CBC WITH DIFFERENTIAL (CANCER CENTER ONLY)
ABS IMMATURE GRANULOCYTES: 0.03 10*3/uL (ref 0.00–0.07)
BASOS ABS: 0 10*3/uL (ref 0.0–0.1)
Basophils Relative: 1 %
Eosinophils Absolute: 0.2 10*3/uL (ref 0.0–0.5)
Eosinophils Relative: 2 %
HEMATOCRIT: 44.6 % (ref 39.0–52.0)
HEMOGLOBIN: 13.8 g/dL (ref 13.0–17.0)
Immature Granulocytes: 0 %
LYMPHS ABS: 2.8 10*3/uL (ref 0.7–4.0)
LYMPHS PCT: 37 %
MCH: 26.3 pg (ref 26.0–34.0)
MCHC: 30.9 g/dL (ref 30.0–36.0)
MCV: 85 fL (ref 80.0–100.0)
MONO ABS: 0.6 10*3/uL (ref 0.1–1.0)
Monocytes Relative: 8 %
NEUTROS ABS: 4 10*3/uL (ref 1.7–7.7)
Neutrophils Relative %: 52 %
PLATELETS: 256 10*3/uL (ref 150–400)
RBC: 5.25 MIL/uL (ref 4.22–5.81)
RDW: 15.3 % (ref 11.5–15.5)
WBC Count: 7.7 10*3/uL (ref 4.0–10.5)
nRBC: 0 % (ref 0.0–0.2)

## 2019-01-13 NOTE — Telephone Encounter (Signed)
Scheduled appt per 02/10 los.  Printed calendar and avs.

## 2019-01-13 NOTE — Progress Notes (Signed)
[place] Telephone:(336) [phone]   Fax:(336) [phone]  OFFICE PROGRESS NOTE  Seward Carol, MD 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   PRIOR THERAPY: Status post right upper lobectomy on Apr 30, 2009 under the care of Dr. Arlyce Dice.   CURRENT THERAPY: Observation.  INTERVAL HISTORY: Bobby [name] 73 y.o. male returns to the clinic today for follow-up visit.  The patient is feeling fine today with no concerning complaints.  He denied having any current chest pain, shortness of breath, cough or hemoptysis.  He denied having any fever or chills.  He has no nausea, vomiting, diarrhea or constipation.  He had repeat CT scan of the chest at [facility] in November 2019 that showed a stable disease.  He was supposed to have repeat CT scan of the chest in 6 months but he preferred to have his imaging studies done at [facility] in [place].  The patient denied having any recent weight loss or night sweats.  He has no fever or chills.  He is here today for evaluation and repeat blood work.  MEDICAL HISTORY: Past Medical History:  Diagnosis Date  . BPH (benign prostatic hypertrophy)   . Cancer ([place])    lung  . COPD (chronic obstructive pulmonary disease) (HCC)    emphysema  . Frequency of urination   . History of lung cancer    STAGE 1A NON-SMALL CELL LUNG CARCINOMA ---  04-10-2009   S/P RIGHT UPPER LOBECTOMY , NO CHEMORADIATION--  NO RECURRENCE  (ONCOLOGIST-- DR [facility])  . Hyperlipidemia   . Hypertension   . Pulmonary nodule seen on imaging study    STABLE LEFT LOWER LOBE NODULE  PER CHEST CT--  MONITORED BY DR [facility]  . Sleep apnea    OSA does not use CPAP  . Type 2 diabetes mellitus ([place])   . Urgency of urination     ALLERGIES:  has No Known Allergies.  MEDICATIONS:  Current Outpatient  Medications  Medication Sig Dispense Refill  . allopurinol (ZYLOPRIM) 100 MG tablet     . allopurinol (ZYLOPRIM) 300 MG tablet Take 300 mg by mouth daily.    Marland Kitchen amLODipine-benazepril (LOTREL) 5-20 MG per capsule Take 1 capsule by mouth every morning.     Marland Kitchen aspirin EC 81 MG tablet Take by mouth.    . Aspirin-Salicylamide-Caffeine (BC HEADACHE POWDER PO) Take by mouth.    Marland Kitchen b complex vitamins tablet Take 1 tablet by mouth daily.    . cholecalciferol (VITAMIN D) 1000 UNITS tablet Take 1,000 Units by mouth daily.    . colchicine 0.6 MG tablet     . fluticasone (FLONASE) 50 MCG/ACT nasal spray SPRAY 1 SPRAY INTO EACH NOSTRIL EVERY DAY    . fluticasone furoate-vilanterol (BREO ELLIPTA) 100-25 MCG/INH AEPB INHALE 1 PUFF BY MOUTH ONCE A DAY    . glyBURIDE (DIABETA) 5 MG tablet Take 5 mg by mouth daily.    . indomethacin (INDOCIN) 50 MG capsule Take 50 mg by mouth 2 (two) times daily.  3  . NIACIN-SIMVASTATIN ER PO Take by mouth.    . predniSONE (STERAPRED UNI-PAK 21 TAB) 10 MG (21) TBPK tablet     . simvastatin (ZOCOR) 20 MG tablet 1 TABLET IN THE EVENING ONCE A DAY ORALLY 90 DAYS  1  . sitaGLIPtan-metformin (JANUMET) 50-500 MG per tablet Take  1 tablet by mouth 2 (two) times daily with a meal.     . traMADol (ULTRAM) 50 MG tablet Take 1 tablet (50 mg total) by mouth every 6 (six) hours as needed for moderate pain. 20 tablet 0   Current Facility-Administered Medications  Medication Dose Route Frequency Provider Last Rate Last Dose  . 0.9 %  sodium chloride infusion  500 mL Intravenous Once Armbruster, Carlota Raspberry, MD        SURGICAL HISTORY:  Past Surgical History:  Procedure Laterality Date  . CARDIOVASCULAR STRESS TEST  04-08-2009   NORMAL NUCLEAR STUDY/ NO ISCHEMIA/ EF 60%  . CARPAL TUNNEL RELEASE Right   . COLONOSCOPY    . CYST EXCISION Left 10/03/2018   Procedure: EXCISION OF LEFT AXILLARY SEBACEOUS CYST;  Surgeon: Coralie Keens, MD;  Location: [facility];  Service:  General;  Laterality: Left;  . excision of abcess    . KNEE ARTHROSCOPY W/ MENISCECTOMY Left 10-31-2004  . LEFT SHOULDER ARTHROSCOPY/ ACROMIOPLASTY/ DECOMPRESSION/ SYNOVECTOMY  11-20-2007  . POLYPECTOMY    . TRANSURETHRAL RESECTION OF PROSTATE N/A 11/03/2013   Procedure: TRANSURETHRAL RESECTION OF THE PROSTATE WITH GYRUS INSTRUMENTS;  Surgeon: Franchot Gallo, MD;  Location: [facility];  Service: Urology;  Laterality: N/A;  . TRIGGER FINGER RELEASE Right 2019  . VIDEO ASSISTED THORACOSCOPY (VATS)/ LOBECTOMY Right 04-10-2009  DR Arlyce Dice   RIGHT UPPER LOBECTOMY AND NODE DISSECTION    REVIEW OF SYSTEMS:  A comprehensive review of systems was negative.   PHYSICAL EXAMINATION: General appearance: alert, cooperative and no distress Head: Normocephalic, without obvious abnormality, atraumatic Neck: no adenopathy, supple, symmetrical, trachea midline and thyroid not enlarged, symmetric, no tenderness/mass/nodules Lymph nodes: Cervical, supraclavicular, and axillary nodes normal. Resp: clear to auscultation bilaterally and normal percussion bilaterally Back: symmetric, no curvature. ROM normal. No CVA tenderness. Cardio: regular rate and rhythm, S1, S2 normal, no murmur, click, rub or gallop GI: soft, non-tender; bowel sounds normal; no masses,  no organomegaly Extremities: extremities normal, atraumatic, no cyanosis or edema  ECOG PERFORMANCE STATUS: 0 - Asymptomatic  Blood pressure 130/75, pulse 69, temperature 98.4 F (36.9 C), temperature source Oral, resp. rate 20, height 5\' 11"  (1.803 m), weight 227 lb (103 kg), SpO2 100 %.  LABORATORY DATA: Lab Results  Component Value Date   WBC 7.7 01/13/2019   HGB 13.8 01/13/2019   HCT 44.6 01/13/2019   MCV 85.0 01/13/2019   PLT 256 01/13/2019      Chemistry      Component Value Date/Time   NA 142 01/13/2019 1436   NA 142 08/24/2015 1100   K 4.1 01/13/2019 1436   K 4.0 08/24/2015 1100   CL 108 01/13/2019 1436   CL 108  (H) 02/11/2013 0811   CO2 25 01/13/2019 1436   CO2 26 08/24/2015 1100   BUN 14 01/13/2019 1436   BUN 10.2 08/24/2015 1100   CREATININE 1.16 01/13/2019 1436   CREATININE 0.8 08/24/2015 1100      Component Value Date/Time   CALCIUM 9.2 01/13/2019 1436   CALCIUM 9.0 08/24/2015 1100   ALKPHOS 67 01/13/2019 1436   ALKPHOS 63 08/24/2015 1100   AST 24 01/13/2019 1436   AST 29 08/24/2015 1100   ALT 29 01/13/2019 1436   ALT 41 08/24/2015 1100   BILITOT <0.2 (L) 01/13/2019 1436   BILITOT 0.25 08/24/2015 1100       RADIOGRAPHIC STUDIES: No results found.  ASSESSMENT AND PLAN: This is a very pleasant 73 years old African  American male with stage IA non-small cell lung cancer status post right upper lobectomy and has been observation since May of 2010 daily with no evidence for disease recurrence. Repeat CT scan of the chest showed the persistent left lower lobe pulmonary nodule with no significant change since 2014.  This nodule was not hypermetabolic on the PET scan performed in July 2013. He is currently on observation and feeling fine. I recommended for the patient to continue on observation for now with repeat CT scan of the chest in 3 months for restaging of his disease and evaluation of the pulmonary nodule. He was advised to call immediately if he has any concerning symptoms in the interval. The patient voices understanding of current disease status and treatment options and is in agreement with the current care plan.  All questions were answered. The patient knows to call the clinic with any problems, questions or concerns. We can certainly see the patient much sooner if necessary.  Disclaimer: This note was dictated with voice recognition software. Similar sounding words can inadvertently be transcribed and may be missed upon review.

## 2019-02-04 DIAGNOSIS — R0981 Nasal congestion: Secondary | ICD-10-CM | POA: Diagnosis not present

## 2019-02-04 DIAGNOSIS — J302 Other seasonal allergic rhinitis: Secondary | ICD-10-CM | POA: Diagnosis not present

## 2019-02-13 ENCOUNTER — Encounter (INDEPENDENT_AMBULATORY_CARE_PROVIDER_SITE_OTHER): Payer: Medicare PPO | Admitting: Ophthalmology

## 2019-03-04 DIAGNOSIS — M19131 Post-traumatic osteoarthritis, right wrist: Secondary | ICD-10-CM | POA: Diagnosis not present

## 2019-03-04 DIAGNOSIS — M65332 Trigger finger, left middle finger: Secondary | ICD-10-CM | POA: Diagnosis not present

## 2019-04-03 DIAGNOSIS — M65352 Trigger finger, left little finger: Secondary | ICD-10-CM | POA: Diagnosis not present

## 2019-04-03 DIAGNOSIS — M19131 Post-traumatic osteoarthritis, right wrist: Secondary | ICD-10-CM | POA: Diagnosis not present

## 2019-04-03 DIAGNOSIS — M65341 Trigger finger, right ring finger: Secondary | ICD-10-CM | POA: Diagnosis not present

## 2019-04-03 DIAGNOSIS — M25531 Pain in right wrist: Secondary | ICD-10-CM | POA: Diagnosis not present

## 2019-04-03 DIAGNOSIS — M65332 Trigger finger, left middle finger: Secondary | ICD-10-CM | POA: Diagnosis not present

## 2019-04-03 DIAGNOSIS — M65351 Trigger finger, right little finger: Secondary | ICD-10-CM | POA: Diagnosis not present

## 2019-04-03 DIAGNOSIS — M65331 Trigger finger, right middle finger: Secondary | ICD-10-CM | POA: Diagnosis not present

## 2019-04-03 DIAGNOSIS — M65342 Trigger finger, left ring finger: Secondary | ICD-10-CM | POA: Diagnosis not present

## 2019-04-10 ENCOUNTER — Other Ambulatory Visit: Payer: Self-pay

## 2019-04-10 ENCOUNTER — Encounter (INDEPENDENT_AMBULATORY_CARE_PROVIDER_SITE_OTHER): Payer: Medicare PPO | Admitting: Ophthalmology

## 2019-04-10 DIAGNOSIS — H33303 Unspecified retinal break, bilateral: Secondary | ICD-10-CM | POA: Diagnosis not present

## 2019-04-10 DIAGNOSIS — H43813 Vitreous degeneration, bilateral: Secondary | ICD-10-CM

## 2019-04-10 DIAGNOSIS — I10 Essential (primary) hypertension: Secondary | ICD-10-CM

## 2019-04-10 DIAGNOSIS — H35033 Hypertensive retinopathy, bilateral: Secondary | ICD-10-CM | POA: Diagnosis not present

## 2019-04-11 ENCOUNTER — Ambulatory Visit (HOSPITAL_COMMUNITY): Payer: Medicare PPO

## 2019-04-11 ENCOUNTER — Ambulatory Visit (HOSPITAL_COMMUNITY)
Admission: RE | Admit: 2019-04-11 | Discharge: 2019-04-11 | Disposition: A | Discharge status: Home / Self Care | Payer: Medicare PPO | Source: Ambulatory Visit | Attending: Internal Medicine | Admitting: Internal Medicine

## 2019-04-11 ENCOUNTER — Ambulatory Visit (HOSPITAL_COMMUNITY): Admission: RE | Admit: 2019-04-11 | Payer: Medicare PPO | Source: Ambulatory Visit

## 2019-04-11 ENCOUNTER — Other Ambulatory Visit: Payer: Self-pay

## 2019-04-11 ENCOUNTER — Inpatient Hospital Stay: Payer: Medicare PPO | Attending: Internal Medicine

## 2019-04-11 DIAGNOSIS — Z7951 Long term (current) use of inhaled steroids: Secondary | ICD-10-CM | POA: Diagnosis not present

## 2019-04-11 DIAGNOSIS — E785 Hyperlipidemia, unspecified: Secondary | ICD-10-CM | POA: Insufficient documentation

## 2019-04-11 DIAGNOSIS — Z7984 Long term (current) use of oral hypoglycemic drugs: Secondary | ICD-10-CM | POA: Diagnosis not present

## 2019-04-11 DIAGNOSIS — G4733 Obstructive sleep apnea (adult) (pediatric): Secondary | ICD-10-CM | POA: Insufficient documentation

## 2019-04-11 DIAGNOSIS — Z7982 Long term (current) use of aspirin: Secondary | ICD-10-CM | POA: Diagnosis not present

## 2019-04-11 DIAGNOSIS — Z79899 Other long term (current) drug therapy: Secondary | ICD-10-CM | POA: Diagnosis not present

## 2019-04-11 DIAGNOSIS — J449 Chronic obstructive pulmonary disease, unspecified: Secondary | ICD-10-CM | POA: Insufficient documentation

## 2019-04-11 DIAGNOSIS — Z85118 Personal history of other malignant neoplasm of bronchus and lung: Secondary | ICD-10-CM | POA: Insufficient documentation

## 2019-04-11 DIAGNOSIS — I10 Essential (primary) hypertension: Secondary | ICD-10-CM | POA: Insufficient documentation

## 2019-04-11 DIAGNOSIS — C3490 Malignant neoplasm of unspecified part of unspecified bronchus or lung: Secondary | ICD-10-CM | POA: Diagnosis not present

## 2019-04-11 DIAGNOSIS — E119 Type 2 diabetes mellitus without complications: Secondary | ICD-10-CM | POA: Insufficient documentation

## 2019-04-11 DIAGNOSIS — Z902 Acquired absence of lung [part of]: Secondary | ICD-10-CM | POA: Diagnosis not present

## 2019-04-11 LAB — CBC WITH DIFFERENTIAL (CANCER CENTER ONLY)
Abs Immature Granulocytes: 0.05 10*3/uL (ref 0.00–0.07)
Basophils Absolute: 0 10*3/uL (ref 0.0–0.1)
Basophils Relative: 0 %
Eosinophils Absolute: 0.1 10*3/uL (ref 0.0–0.5)
Eosinophils Relative: 2 %
HCT: 45.1 % (ref 39.0–52.0)
Hemoglobin: 14.1 g/dL (ref 13.0–17.0)
Immature Granulocytes: 1 %
Lymphocytes Relative: 29 %
Lymphs Abs: 2 10*3/uL (ref 0.7–4.0)
MCH: 26.1 pg (ref 26.0–34.0)
MCHC: 31.3 g/dL (ref 30.0–36.0)
MCV: 83.4 fL (ref 80.0–100.0)
Monocytes Absolute: 0.6 10*3/uL (ref 0.1–1.0)
Monocytes Relative: 8 %
Neutro Abs: 4.2 10*3/uL (ref 1.7–7.7)
Neutrophils Relative %: 60 %
Platelet Count: 235 10*3/uL (ref 150–400)
RBC: 5.41 MIL/uL (ref 4.22–5.81)
RDW: 15.5 % (ref 11.5–15.5)
WBC Count: 6.9 10*3/uL (ref 4.0–10.5)
nRBC: 0 % (ref 0.0–0.2)

## 2019-04-11 LAB — CMP (CANCER CENTER ONLY)
ALT: 36 U/L (ref 0–44)
AST: 26 U/L (ref 15–41)
Albumin: 3.7 g/dL (ref 3.5–5.0)
Alkaline Phosphatase: 67 U/L (ref 38–126)
Anion gap: 9 (ref 5–15)
BUN: 14 mg/dL (ref 8–23)
CO2: 25 mmol/L (ref 22–32)
Calcium: 8.9 mg/dL (ref 8.9–10.3)
Chloride: 108 mmol/L (ref 98–111)
Creatinine: 1.04 mg/dL (ref 0.61–1.24)
GFR, Est AFR Am: >60 mL/min (ref >60)
GFR, Estimated: >60 mL/min (ref >60)
Glucose, Bld: 118 mg/dL — ABNORMAL HIGH (ref 70–99)
Potassium: 3.9 mmol/L (ref 3.5–5.1)
Sodium: 142 mmol/L (ref 135–145)
Total Bilirubin: 0.3 mg/dL (ref 0.3–1.2)
Total Protein: 6.9 g/dL (ref 6.5–8.1)

## 2019-04-11 MED ORDER — IOHEXOL 300 MG/ML  SOLN
75.0000 mL | Freq: Once | INTRAMUSCULAR | Status: AC | PRN
  Administered 2019-04-11: 75 mL via INTRAVENOUS

## 2019-04-11 MED ORDER — SODIUM CHLORIDE (PF) 0.9 % IJ SOLN
INTRAMUSCULAR | Status: AC
  Filled 2019-04-11: qty 50

## 2019-04-15 ENCOUNTER — Inpatient Hospital Stay (HOSPITAL_BASED_OUTPATIENT_CLINIC_OR_DEPARTMENT_OTHER): Payer: Medicare PPO | Admitting: Internal Medicine

## 2019-04-15 ENCOUNTER — Other Ambulatory Visit: Payer: Self-pay

## 2019-04-15 ENCOUNTER — Encounter: Payer: Self-pay | Admitting: Internal Medicine

## 2019-04-15 VITALS — BP 142/82 | HR 71 | Temp 98.3°F | Resp 20 | Ht 71.0 in | Wt 231.1 lb

## 2019-04-15 DIAGNOSIS — C3411 Malignant neoplasm of upper lobe, right bronchus or lung: Secondary | ICD-10-CM

## 2019-04-15 DIAGNOSIS — Z79899 Other long term (current) drug therapy: Secondary | ICD-10-CM

## 2019-04-15 DIAGNOSIS — E785 Hyperlipidemia, unspecified: Secondary | ICD-10-CM | POA: Diagnosis not present

## 2019-04-15 DIAGNOSIS — Z85118 Personal history of other malignant neoplasm of bronchus and lung: Secondary | ICD-10-CM

## 2019-04-15 DIAGNOSIS — Z7951 Long term (current) use of inhaled steroids: Secondary | ICD-10-CM

## 2019-04-15 DIAGNOSIS — Z902 Acquired absence of lung [part of]: Secondary | ICD-10-CM | POA: Diagnosis not present

## 2019-04-15 DIAGNOSIS — E119 Type 2 diabetes mellitus without complications: Secondary | ICD-10-CM

## 2019-04-15 DIAGNOSIS — G4733 Obstructive sleep apnea (adult) (pediatric): Secondary | ICD-10-CM | POA: Diagnosis not present

## 2019-04-15 DIAGNOSIS — Z7982 Long term (current) use of aspirin: Secondary | ICD-10-CM

## 2019-04-15 DIAGNOSIS — I10 Essential (primary) hypertension: Secondary | ICD-10-CM

## 2019-04-15 DIAGNOSIS — Z7984 Long term (current) use of oral hypoglycemic drugs: Secondary | ICD-10-CM

## 2019-04-15 DIAGNOSIS — J449 Chronic obstructive pulmonary disease, unspecified: Secondary | ICD-10-CM

## 2019-04-15 DIAGNOSIS — C3490 Malignant neoplasm of unspecified part of unspecified bronchus or lung: Secondary | ICD-10-CM

## 2019-04-15 NOTE — Progress Notes (Signed)
[place] Telephone:(336) [phone]   Fax:(336) [phone]  OFFICE PROGRESS NOTE  Seward Carol, MD 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   PRIOR THERAPY: Status post right upper lobectomy on Apr 30, 2009 under the care of Dr. Arlyce Dice.   CURRENT THERAPY: Observation.  INTERVAL HISTORY: Bobby [name] 73 y.o. male returns to the clinic today for follow-up visit.  The patient is feeling fine today with no concerning complaints.  He denied having any chest pain, shortness of breath, cough or hemoptysis.  He denied having any fever or chills.  He has no nausea, vomiting, diarrhea.  He has no headache or visual changes.  The patient had repeat CT scan of the chest performed recently and is here for evaluation and discussion of his scan results.   MEDICAL HISTORY: Past Medical History:  Diagnosis Date  . BPH (benign prostatic hypertrophy)   . Cancer ([place])    lung  . COPD (chronic obstructive pulmonary disease) (HCC)    emphysema  . Frequency of urination   . History of lung cancer    STAGE 1A NON-SMALL CELL LUNG CARCINOMA ---  04-10-2009   S/P RIGHT UPPER LOBECTOMY , NO CHEMORADIATION--  NO RECURRENCE  (ONCOLOGIST-- DR [facility])  . Hyperlipidemia   . Hypertension   . Pulmonary nodule seen on imaging study    STABLE LEFT LOWER LOBE NODULE  PER CHEST CT--  MONITORED BY DR [facility]  . Sleep apnea    OSA does not use CPAP  . Type 2 diabetes mellitus ([place])   . Urgency of urination     ALLERGIES:  has No Known Allergies.  MEDICATIONS:  Current Outpatient Medications  Medication Sig Dispense Refill  . allopurinol (ZYLOPRIM) 100 MG tablet     . allopurinol (ZYLOPRIM) 300 MG tablet Take 300 mg by mouth daily.    Marland Kitchen amLODipine-benazepril (LOTREL) 5-20 MG per capsule Take 1 capsule by mouth every morning.     Marland Kitchen aspirin  EC 81 MG tablet Take by mouth.    . Aspirin-Salicylamide-Caffeine (BC HEADACHE POWDER PO) Take by mouth.    Marland Kitchen b complex vitamins tablet Take 1 tablet by mouth daily.    . cholecalciferol (VITAMIN D) 1000 UNITS tablet Take 1,000 Units by mouth daily.    . colchicine 0.6 MG tablet     . fluticasone (FLONASE) 50 MCG/ACT nasal spray SPRAY 1 SPRAY INTO EACH NOSTRIL EVERY DAY    . fluticasone furoate-vilanterol (BREO ELLIPTA) 100-25 MCG/INH AEPB INHALE 1 PUFF BY MOUTH ONCE A DAY    . glyBURIDE (DIABETA) 5 MG tablet Take 5 mg by mouth daily.    . indomethacin (INDOCIN) 50 MG capsule Take 50 mg by mouth 2 (two) times daily.  3  . NIACIN-SIMVASTATIN ER PO Take by mouth.    . predniSONE (STERAPRED UNI-PAK 21 TAB) 10 MG (21) TBPK tablet     . simvastatin (ZOCOR) 20 MG tablet 1 TABLET IN THE EVENING ONCE A DAY ORALLY 90 DAYS  1  . sitaGLIPtan-metformin (JANUMET) 50-500 MG per tablet Take 1 tablet by mouth 2 (two) times daily with a meal.     . traMADol (ULTRAM) 50 MG tablet Take 1 tablet (50 mg total) by mouth every 6 (six) hours as needed for moderate pain. 20 tablet 0   Current Facility-Administered Medications  Medication Dose Route Frequency  Provider Last Rate Last Dose  . 0.9 %  sodium chloride infusion  500 mL Intravenous Once Armbruster, Carlota Raspberry, MD        SURGICAL HISTORY:  Past Surgical History:  Procedure Laterality Date  . CARDIOVASCULAR STRESS TEST  04-08-2009   NORMAL NUCLEAR STUDY/ NO ISCHEMIA/ EF 60%  . CARPAL TUNNEL RELEASE Right   . COLONOSCOPY    . CYST EXCISION Left 10/03/2018   Procedure: EXCISION OF LEFT AXILLARY SEBACEOUS CYST;  Surgeon: Coralie Keens, MD;  Location: [facility];  Service: General;  Laterality: Left;  . excision of abcess    . KNEE ARTHROSCOPY W/ MENISCECTOMY Left 10-31-2004  . LEFT SHOULDER ARTHROSCOPY/ ACROMIOPLASTY/ DECOMPRESSION/ SYNOVECTOMY  11-20-2007  . POLYPECTOMY    . TRANSURETHRAL RESECTION OF PROSTATE N/A 11/03/2013    Procedure: TRANSURETHRAL RESECTION OF THE PROSTATE WITH GYRUS INSTRUMENTS;  Surgeon: Franchot Gallo, MD;  Location: [facility];  Service: Urology;  Laterality: N/A;  . TRIGGER FINGER RELEASE Right 2019  . VIDEO ASSISTED THORACOSCOPY (VATS)/ LOBECTOMY Right 04-10-2009  DR Arlyce Dice   RIGHT UPPER LOBECTOMY AND NODE DISSECTION    REVIEW OF SYSTEMS:  A comprehensive review of systems was negative.   PHYSICAL EXAMINATION: General appearance: alert, cooperative and no distress Head: Normocephalic, without obvious abnormality, atraumatic Neck: no adenopathy, supple, symmetrical, trachea midline and thyroid not enlarged, symmetric, no tenderness/mass/nodules Lymph nodes: Cervical, supraclavicular, and axillary nodes normal. Resp: clear to auscultation bilaterally and normal percussion bilaterally Back: symmetric, no curvature. ROM normal. No CVA tenderness. Cardio: regular rate and rhythm, S1, S2 normal, no murmur, click, rub or gallop GI: soft, non-tender; bowel sounds normal; no masses,  no organomegaly Extremities: extremities normal, atraumatic, no cyanosis or edema  ECOG PERFORMANCE STATUS: 0 - Asymptomatic  Blood pressure (!) 142/82, pulse 71, temperature 98.3 F (36.8 C), temperature source Oral, resp. rate 20, height 5\' 11"  (1.803 m), weight 231 lb 1.6 oz (104.8 kg), SpO2 100 %.  LABORATORY DATA: Lab Results  Component Value Date   WBC 6.9 04/11/2019   HGB 14.1 04/11/2019   HCT 45.1 04/11/2019   MCV 83.4 04/11/2019   PLT 235 04/11/2019      Chemistry      Component Value Date/Time   NA 142 04/11/2019 1040   NA 142 08/24/2015 1100   K 3.9 04/11/2019 1040   K 4.0 08/24/2015 1100   CL 108 04/11/2019 1040   CL 108 (H) 02/11/2013 0811   CO2 25 04/11/2019 1040   CO2 26 08/24/2015 1100   BUN 14 04/11/2019 1040   BUN 10.2 08/24/2015 1100   CREATININE 1.04 04/11/2019 1040   CREATININE 0.8 08/24/2015 1100      Component Value Date/Time   CALCIUM 8.9 04/11/2019  1040   CALCIUM 9.0 08/24/2015 1100   ALKPHOS 67 04/11/2019 1040   ALKPHOS 63 08/24/2015 1100   AST 26 04/11/2019 1040   AST 29 08/24/2015 1100   ALT 36 04/11/2019 1040   ALT 41 08/24/2015 1100   BILITOT 0.3 04/11/2019 1040   BILITOT 0.25 08/24/2015 1100       RADIOGRAPHIC STUDIES: Ct Chest W Contrast  Result Date: 04/11/2019 CLINICAL DATA:  Lung cancer, status post right lung resection EXAM: CT CHEST WITH CONTRAST TECHNIQUE: Multidetector CT imaging of the chest was performed during intravenous contrast administration. CONTRAST:  46mL OMNIPAQUE IOHEXOL 300 MG/ML  SOLN COMPARISON:  04/22/2018 FINDINGS: Cardiovascular: Heart is normal in size.  No pericardial effusion. No evidence of thoracic aortic aneurysm.  Mediastinum/Nodes: No suspicious mediastinal lymphadenopathy. Visualized thyroid is unremarkable. Lungs/Pleura: Status post right upper lobectomy. Dominant subsolid 16 x 16 mm nodule in the central left lower lobe (series 5/image 80), grossly unchanged from multiple prior studies. Additional scattered small ground-glass nodules in the left lower lobe. Mild centrilobular and paraseptal emphysematous changes. No focal consolidation. No pleural effusion or pneumothorax. Upper Abdomen: Visualized upper abdomen is grossly unremarkable. Musculoskeletal: Degenerative changes of the visualized thoracolumbar spine. IMPRESSION: Status post right upper lobectomy. No evidence of recurrent or metastatic disease. Stable 16 mm subsolid nodule in the left lower lobe, chronic. Continue annual follow-up is suggested. Emphysema (ICD10-J43.9). Electronically Signed   By: Julian Hy M.D.   On: 04/11/2019 16:30    ASSESSMENT AND PLAN: This is a very pleasant 73 years old [place] American male with stage IA non-small cell lung cancer status post right upper lobectomy and has been observation since May of 2010 daily with no evidence for disease recurrence. Repeat CT scan of the chest showed the persistent left  lower lobe pulmonary nodule with no significant change since 2014.  This nodule was not hypermetabolic on the PET scan performed in July 2013. The patient is currently on observation and he is feeling fine with no concerning complaints. He had repeat CT scan of the chest performed recently.  The scan showed no concerning changes in the left lower lobe pulmonary nodule that has been chronic in nature. For hypertension we will continue with his current blood pressure medication and monitor it closely at home. I discussed the scan results with the patient and recommended for him to continue on observation with repeat CT scan of the chest in 1 year. He was advised to call immediately if he has any concerning symptoms in the interval. The patient voices understanding of current disease status and treatment options and is in agreement with the current care plan.  All questions were answered. The patient knows to call the clinic with any problems, questions or concerns. We can certainly see the patient much sooner if necessary.  Disclaimer: This note was dictated with voice recognition software. Similar sounding words can inadvertently be transcribed and may be missed upon review.

## 2019-04-16 ENCOUNTER — Telehealth: Payer: Self-pay | Admitting: Internal Medicine

## 2019-04-16 NOTE — Telephone Encounter (Signed)
Scheduled appt per 5/12 los - reminder letter mailed

## 2019-04-17 ENCOUNTER — Telehealth: Payer: Self-pay | Admitting: *Deleted

## 2019-04-17 ENCOUNTER — Encounter: Payer: Self-pay | Admitting: *Deleted

## 2019-04-17 NOTE — Telephone Encounter (Signed)
Appt calendar mailed

## 2019-05-05 DIAGNOSIS — C678 Malignant neoplasm of overlapping sites of bladder: Secondary | ICD-10-CM | POA: Diagnosis not present

## 2019-05-05 DIAGNOSIS — N401 Enlarged prostate with lower urinary tract symptoms: Secondary | ICD-10-CM | POA: Diagnosis not present

## 2019-05-05 DIAGNOSIS — E291 Testicular hypofunction: Secondary | ICD-10-CM | POA: Diagnosis not present

## 2019-05-05 DIAGNOSIS — N5201 Erectile dysfunction due to arterial insufficiency: Secondary | ICD-10-CM | POA: Diagnosis not present

## 2019-05-05 DIAGNOSIS — R350 Frequency of micturition: Secondary | ICD-10-CM | POA: Diagnosis not present

## 2019-05-19 DIAGNOSIS — I700 Atherosclerosis of aorta: Secondary | ICD-10-CM | POA: Diagnosis not present

## 2019-05-19 DIAGNOSIS — Z125 Encounter for screening for malignant neoplasm of prostate: Secondary | ICD-10-CM | POA: Diagnosis not present

## 2019-05-19 DIAGNOSIS — Z85118 Personal history of other malignant neoplasm of bronchus and lung: Secondary | ICD-10-CM | POA: Diagnosis not present

## 2019-05-19 DIAGNOSIS — E1169 Type 2 diabetes mellitus with other specified complication: Secondary | ICD-10-CM | POA: Diagnosis not present

## 2019-05-19 DIAGNOSIS — E7800 Pure hypercholesterolemia, unspecified: Secondary | ICD-10-CM | POA: Diagnosis not present

## 2019-05-19 DIAGNOSIS — I10 Essential (primary) hypertension: Secondary | ICD-10-CM | POA: Diagnosis not present

## 2019-05-20 DIAGNOSIS — I10 Essential (primary) hypertension: Secondary | ICD-10-CM | POA: Diagnosis not present

## 2019-05-20 DIAGNOSIS — Z85118 Personal history of other malignant neoplasm of bronchus and lung: Secondary | ICD-10-CM | POA: Diagnosis not present

## 2019-05-20 DIAGNOSIS — E1169 Type 2 diabetes mellitus with other specified complication: Secondary | ICD-10-CM | POA: Diagnosis not present

## 2019-05-20 DIAGNOSIS — E7800 Pure hypercholesterolemia, unspecified: Secondary | ICD-10-CM | POA: Diagnosis not present

## 2019-05-20 DIAGNOSIS — Z125 Encounter for screening for malignant neoplasm of prostate: Secondary | ICD-10-CM | POA: Diagnosis not present

## 2019-06-17 ENCOUNTER — Other Ambulatory Visit: Payer: Self-pay | Admitting: *Deleted

## 2019-06-17 DIAGNOSIS — Z20822 Contact with and (suspected) exposure to covid-19: Secondary | ICD-10-CM

## 2019-06-18 DIAGNOSIS — R6889 Other general symptoms and signs: Secondary | ICD-10-CM | POA: Diagnosis not present

## 2019-06-22 LAB — NOVEL CORONAVIRUS, NAA: SARS-CoV-2, NAA: NOT DETECTED

## 2019-09-04 ENCOUNTER — Encounter: Payer: Self-pay | Admitting: Gastroenterology

## 2019-09-24 ENCOUNTER — Other Ambulatory Visit: Payer: Self-pay

## 2019-09-24 ENCOUNTER — Ambulatory Visit (AMBULATORY_SURGERY_CENTER): Payer: Medicare PPO | Admitting: *Deleted

## 2019-09-24 VITALS — Temp 96.8°F | Ht 71.0 in | Wt 235.0 lb

## 2019-09-24 DIAGNOSIS — Z86010 Personal history of colonic polyps: Secondary | ICD-10-CM

## 2019-09-24 DIAGNOSIS — Z1159 Encounter for screening for other viral diseases: Secondary | ICD-10-CM

## 2019-09-24 NOTE — Progress Notes (Signed)
Patient is here in-person for PV. Patient denies any allergies to eggs or soy. Patient denies any problems with anesthesia/sedation. Patient denies any oxygen use at home. Patient denies taking any diet/weight loss medications or blood thinners. Patient is not being treated for MRSA or C-diff.   Pt is aware that care partner will wait in the car during procedure; if they feel like they will be too hot or cold to wait in the car; they may wait in the 4 th floor lobby. Patient is aware to bring only one care partner. We want them to wear a mask (we do not have any that we can provide them), practice social distancing, and we will check their temperatures when they get here.  I did remind the patient that their care partner needs to stay in the parking lot the entire time and have a cell phone available, we will call them when the pt is ready for discharge. Patient will wear mask into building.  Covid screening test on 10/03/2019-pt is aware!

## 2019-10-02 ENCOUNTER — Other Ambulatory Visit: Payer: Self-pay

## 2019-10-02 DIAGNOSIS — Z1159 Encounter for screening for other viral diseases: Secondary | ICD-10-CM

## 2019-10-03 ENCOUNTER — Other Ambulatory Visit: Payer: Self-pay | Admitting: Gastroenterology

## 2019-10-03 DIAGNOSIS — Z1159 Encounter for screening for other viral diseases: Secondary | ICD-10-CM | POA: Diagnosis not present

## 2019-10-03 LAB — SARS CORONAVIRUS 2 (TAT 6-24 HRS): SARS Coronavirus 2: NEGATIVE

## 2019-10-08 ENCOUNTER — Ambulatory Visit (AMBULATORY_SURGERY_CENTER): Payer: Medicare PPO | Admitting: Gastroenterology

## 2019-10-08 ENCOUNTER — Other Ambulatory Visit: Payer: Self-pay | Admitting: Gastroenterology

## 2019-10-08 ENCOUNTER — Encounter: Payer: Self-pay | Admitting: Gastroenterology

## 2019-10-08 ENCOUNTER — Other Ambulatory Visit: Payer: Self-pay

## 2019-10-08 VITALS — BP 114/66 | HR 60 | Temp 97.5°F | Resp 17 | Ht 71.0 in | Wt 235.0 lb

## 2019-10-08 DIAGNOSIS — Z86010 Personal history of colonic polyps: Secondary | ICD-10-CM | POA: Diagnosis not present

## 2019-10-08 DIAGNOSIS — K621 Rectal polyp: Secondary | ICD-10-CM

## 2019-10-08 DIAGNOSIS — J449 Chronic obstructive pulmonary disease, unspecified: Secondary | ICD-10-CM | POA: Diagnosis not present

## 2019-10-08 DIAGNOSIS — D125 Benign neoplasm of sigmoid colon: Secondary | ICD-10-CM

## 2019-10-08 DIAGNOSIS — G4733 Obstructive sleep apnea (adult) (pediatric): Secondary | ICD-10-CM | POA: Diagnosis not present

## 2019-10-08 DIAGNOSIS — K635 Polyp of colon: Secondary | ICD-10-CM | POA: Diagnosis not present

## 2019-10-08 MED ORDER — SODIUM CHLORIDE 0.9 % IV SOLN: 500.0000 mL | INTRAVENOUS | Status: DC

## 2019-10-08 NOTE — Progress Notes (Signed)
Temp jb V/S CW I have reviewed the patient's medical history in detail and updated the computerized patient record.

## 2019-10-08 NOTE — Progress Notes (Signed)
Report given to PACU, vss 

## 2019-10-08 NOTE — Patient Instructions (Signed)
Handout on polyps, hemorrhoids given   YOU HAD AN ENDOSCOPIC PROCEDURE TODAY AT [facility]:   Refer to the procedure report that was given to you for any specific questions about what was found during the examination.  If the procedure report does not answer your questions, please call your gastroenterologist to clarify.  If you requested that your care partner not be given the details of your procedure findings, then the procedure report has been included in a sealed envelope for you to review at your convenience later.  YOU SHOULD EXPECT: Some feelings of bloating in the abdomen. Passage of more gas than usual.  Walking can help get rid of the air that was put into your GI tract during the procedure and reduce the bloating. If you had a lower endoscopy (such as a colonoscopy or flexible sigmoidoscopy) you may notice spotting of blood in your stool or on the toilet paper. If you underwent a bowel prep for your procedure, you may not have a normal bowel movement for a few days.  Please Note:  You might notice some irritation and congestion in your nose or some drainage.  This is from the oxygen used during your procedure.  There is no need for concern and it should clear up in a day or so.  SYMPTOMS TO REPORT IMMEDIATELY:   Following lower endoscopy (colonoscopy or flexible sigmoidoscopy):  Excessive amounts of blood in the stool  Significant tenderness or worsening of abdominal pains  Swelling of the abdomen that is new, acute  Fever of 100F or higher   For urgent or emergent issues, a gastroenterologist can be reached at any hour by calling [phone].   DIET:  We do recommend a small meal at first, but then you may proceed to your regular diet.  Drink plenty of fluids but you should avoid alcoholic beverages for 24 hours.  ACTIVITY:  You should plan to take it easy for the rest of today and you should NOT DRIVE or use heavy machinery until tomorrow (because of the  sedation medicines used during the test).    FOLLOW UP: Our staff will call the number listed on your records 48-72 hours following your procedure to check on you and address any questions or concerns that you may have regarding the information given to you following your procedure. If we do not reach you, we will leave a message.  We will attempt to reach you two times.  During this call, we will ask if you have developed any symptoms of COVID 19. If you develop any symptoms (ie: fever, flu-like symptoms, shortness of breath, cough etc.) before then, please call [phone].  If you test positive for Covid 19 in the 2 weeks post procedure, please call and report this information to [place].    If any biopsies were taken you will be contacted by phone or by letter within the next 1-3 weeks.  Please call us at [phone] if you have not heard about the biopsies in 3 weeks.    SIGNATURES/CONFIDENTIALITY: You and/or your care partner have signed paperwork which will be entered into your electronic medical record.  These signatures attest to the fact that that the information above on your After Visit Summary has been reviewed and is understood.  Full responsibility of the confidentiality of this discharge information lies with you and/or your care-partner.

## 2019-10-08 NOTE — Op Note (Signed)
[facility] Patient Name: Bobby [name] Procedure Date: 10/08/2019 8:05 AM MRN: [id number] Endoscopist: Remo Lipps P. Havery Moros , MD Age: 73 Referring MD:  Date of Birth: [birth year] Gender: Male Account #: [id number] Procedure:                Colonoscopy Indications:              Surveillance: History of numerous (17) adenomas on                            last colonoscopy one year ago Medicines:                Monitored Anesthesia Care Procedure:                Pre-Anesthesia Assessment:                           - Prior to the procedure, a History and Physical                            was performed, and patient medications and                            allergies were reviewed. [name] tolerance of                            previous anesthesia was also reviewed. The risks                            and benefits of the procedure and the sedation                            options and risks were discussed with the patient.                            All questions were answered, and informed consent                            was obtained. Prior Anticoagulants: The patient has                            taken no previous anticoagulant or antiplatelet                            agents. ASA Grade Assessment: III - A patient with                            severe systemic disease. After reviewing the risks                            and benefits, the patient was deemed in                            satisfactory condition to undergo the procedure.  After obtaining informed consent, the colonoscope                            was passed under direct vision. Throughout the                            procedure, the patient's blood pressure, pulse, and                            oxygen saturations were monitored continuously. The                            Colonoscope was introduced through the anus and                            advanced to the the  cecum, identified by                            appendiceal orifice and ileocecal valve. The                            colonoscopy was performed without difficulty. The                            patient tolerated the procedure well. The quality                            of the bowel preparation was good. The ileocecal                            valve, appendiceal orifice, and rectum were                            photographed. Scope In: 8:08:16 AM Scope Out: 8:24:03 AM Scope Withdrawal Time: 0 hours 13 minutes 43 seconds  Total Procedure Duration: 0 hours 15 minutes 47 seconds  Findings:                 The perianal and digital rectal examinations were                            normal.                           A 3 mm polyp was found in the sigmoid colon. The                            polyp was sessile. The polyp was removed with a                            cold snare. Resection and retrieval were complete.                           A few small-mouthed diverticula were found in the  left colon.                           Anal papillae was hypertrophied, with possible                            changes consistent with condyloma. Biopsies were                            taken with a cold forceps for histology.                           Internal hemorrhoids were found during retroflexion.                           The exam was otherwise without abnormality. Complications:            No immediate complications. Estimated blood loss:                            Minimal. Estimated Blood Loss:     Estimated blood loss was minimal. Impression:               - One 3 mm polyp in the sigmoid colon, removed with                            a cold snare. Resected and retrieved.                           - Diverticulosis in the left colon.                           - Anal papillae was hypertrophied, possible                            condylomatous change. Biopsied.                            - Internal hemorrhoids.                           - The examination was otherwise normal. Recommendation:           - Patient has a contact number available for                            emergencies. The signs and symptoms of potential                            delayed complications were discussed with the                            patient. Return to normal activities tomorrow.                            Written discharge instructions were provided to the  patient.                           - Resume previous diet.                           - Continue present medications.                           - Await pathology results. Remo Lipps P. Leannah Guse, MD 10/08/2019 8:31:10 AM This report has been signed electronically.

## 2019-10-08 NOTE — Progress Notes (Signed)
Called to room to assist during endoscopic procedure.  Patient ID and intended procedure confirmed with present staff. Received instructions for my participation in the procedure from the performing physician.  

## 2019-10-10 ENCOUNTER — Telehealth: Payer: Self-pay

## 2019-10-10 NOTE — Telephone Encounter (Signed)
Left message on answering machine. 

## 2019-12-09 DIAGNOSIS — R911 Solitary pulmonary nodule: Secondary | ICD-10-CM | POA: Diagnosis not present

## 2019-12-09 DIAGNOSIS — E7800 Pure hypercholesterolemia, unspecified: Secondary | ICD-10-CM | POA: Diagnosis not present

## 2019-12-09 DIAGNOSIS — I700 Atherosclerosis of aorta: Secondary | ICD-10-CM | POA: Diagnosis not present

## 2019-12-09 DIAGNOSIS — Z8551 Personal history of malignant neoplasm of bladder: Secondary | ICD-10-CM | POA: Diagnosis not present

## 2019-12-09 DIAGNOSIS — Z0001 Encounter for general adult medical examination with abnormal findings: Secondary | ICD-10-CM | POA: Diagnosis not present

## 2019-12-09 DIAGNOSIS — E1169 Type 2 diabetes mellitus with other specified complication: Secondary | ICD-10-CM | POA: Diagnosis not present

## 2019-12-09 DIAGNOSIS — I10 Essential (primary) hypertension: Secondary | ICD-10-CM | POA: Diagnosis not present

## 2019-12-09 DIAGNOSIS — M109 Gout, unspecified: Secondary | ICD-10-CM | POA: Diagnosis not present

## 2020-01-01 DIAGNOSIS — R748 Abnormal levels of other serum enzymes: Secondary | ICD-10-CM | POA: Diagnosis not present

## 2020-04-13 ENCOUNTER — Encounter (HOSPITAL_COMMUNITY): Payer: Self-pay

## 2020-04-13 ENCOUNTER — Ambulatory Visit (HOSPITAL_COMMUNITY)
Admission: RE | Admit: 2020-04-13 | Discharge: 2020-04-13 | Disposition: A | Discharge status: Home / Self Care | Payer: Medicare PPO | Source: Ambulatory Visit | Attending: Internal Medicine | Admitting: Internal Medicine

## 2020-04-13 ENCOUNTER — Other Ambulatory Visit: Payer: Self-pay

## 2020-04-13 ENCOUNTER — Telehealth: Payer: Self-pay | Admitting: Medical Oncology

## 2020-04-13 ENCOUNTER — Inpatient Hospital Stay: Payer: Medicare PPO | Attending: Internal Medicine

## 2020-04-13 DIAGNOSIS — C3411 Malignant neoplasm of upper lobe, right bronchus or lung: Secondary | ICD-10-CM | POA: Diagnosis not present

## 2020-04-13 DIAGNOSIS — R911 Solitary pulmonary nodule: Secondary | ICD-10-CM | POA: Diagnosis not present

## 2020-04-13 DIAGNOSIS — C3490 Malignant neoplasm of unspecified part of unspecified bronchus or lung: Secondary | ICD-10-CM

## 2020-04-13 DIAGNOSIS — R918 Other nonspecific abnormal finding of lung field: Secondary | ICD-10-CM | POA: Diagnosis not present

## 2020-04-13 LAB — CBC WITH DIFFERENTIAL (CANCER CENTER ONLY)
Abs Immature Granulocytes: 0.02 10*3/uL (ref 0.00–0.07)
Basophils Absolute: 0 10*3/uL (ref 0.0–0.1)
Basophils Relative: 1 %
Eosinophils Absolute: 0.1 10*3/uL (ref 0.0–0.5)
Eosinophils Relative: 1 %
HCT: 44.4 % (ref 39.0–52.0)
Hemoglobin: 14 g/dL (ref 13.0–17.0)
Immature Granulocytes: 0 %
Lymphocytes Relative: 29 %
Lymphs Abs: 2.4 10*3/uL (ref 0.7–4.0)
MCH: 26.4 pg (ref 26.0–34.0)
MCHC: 31.5 g/dL (ref 30.0–36.0)
MCV: 83.6 fL (ref 80.0–100.0)
Monocytes Absolute: 0.6 10*3/uL (ref 0.1–1.0)
Monocytes Relative: 8 %
Neutro Abs: 5 10*3/uL (ref 1.7–7.7)
Neutrophils Relative %: 61 %
Platelet Count: 242 10*3/uL (ref 150–400)
RBC: 5.31 MIL/uL (ref 4.22–5.81)
RDW: 15.3 % (ref 11.5–15.5)
WBC Count: 8.2 10*3/uL (ref 4.0–10.5)
nRBC: 0 % (ref 0.0–0.2)

## 2020-04-13 LAB — CMP (CANCER CENTER ONLY)
ALT: 58 U/L — ABNORMAL HIGH (ref 0–44)
AST: 33 U/L (ref 15–41)
Albumin: 3.7 g/dL (ref 3.5–5.0)
Alkaline Phosphatase: 63 U/L (ref 38–126)
Anion gap: 9 (ref 5–15)
BUN: 15 mg/dL (ref 8–23)
CO2: 22 mmol/L (ref 22–32)
Calcium: 9 mg/dL (ref 8.9–10.3)
Chloride: 108 mmol/L (ref 98–111)
Creatinine: 0.98 mg/dL (ref 0.61–1.24)
GFR, Est AFR Am: >60 mL/min (ref >60)
GFR, Estimated: >60 mL/min (ref >60)
Glucose, Bld: 125 mg/dL — ABNORMAL HIGH (ref 70–99)
Potassium: 4 mmol/L (ref 3.5–5.1)
Sodium: 139 mmol/L (ref 135–145)
Total Bilirubin: 0.4 mg/dL (ref 0.3–1.2)
Total Protein: 6.9 g/dL (ref 6.5–8.1)

## 2020-04-13 MED ORDER — SODIUM CHLORIDE (PF) 0.9 % IJ SOLN
INTRAMUSCULAR | Status: AC
  Filled 2020-04-13: qty 50

## 2020-04-13 MED ORDER — IOHEXOL 300 MG/ML  SOLN
75.0000 mL | Freq: Once | INTRAMUSCULAR | Status: AC | PRN
  Administered 2020-04-13: 75 mL via INTRAVENOUS

## 2020-04-14 ENCOUNTER — Encounter: Payer: Self-pay | Admitting: Internal Medicine

## 2020-04-14 ENCOUNTER — Inpatient Hospital Stay (HOSPITAL_BASED_OUTPATIENT_CLINIC_OR_DEPARTMENT_OTHER): Payer: Medicare PPO | Admitting: Internal Medicine

## 2020-04-14 ENCOUNTER — Telehealth: Payer: Self-pay | Admitting: Internal Medicine

## 2020-04-14 ENCOUNTER — Other Ambulatory Visit: Payer: Self-pay

## 2020-04-14 VITALS — BP 149/88 | HR 71 | Temp 98.0°F | Resp 18 | Wt 233.0 lb

## 2020-04-14 DIAGNOSIS — I10 Essential (primary) hypertension: Secondary | ICD-10-CM

## 2020-04-14 DIAGNOSIS — R918 Other nonspecific abnormal finding of lung field: Secondary | ICD-10-CM | POA: Diagnosis not present

## 2020-04-14 DIAGNOSIS — C3490 Malignant neoplasm of unspecified part of unspecified bronchus or lung: Secondary | ICD-10-CM | POA: Diagnosis not present

## 2020-04-14 DIAGNOSIS — C3411 Malignant neoplasm of upper lobe, right bronchus or lung: Secondary | ICD-10-CM | POA: Diagnosis not present

## 2020-04-14 NOTE — Telephone Encounter (Signed)
Scheduled per los. Gave avs and calendar  

## 2020-04-14 NOTE — Telephone Encounter (Signed)
Pt can come in 5/12.

## 2020-04-14 NOTE — Progress Notes (Signed)
[facility] Telephone:([phone]) [phone]   Fax:([phone]) [phone]  OFFICE PROGRESS NOTE  Seward Carol, MD 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   PRIOR THERAPY: Status post right upper lobectomy on Apr 30, 2009 under the care of Dr. Arlyce Dice.   CURRENT THERAPY: Observation.  INTERVAL HISTORY: Bobby [name] 74 y.o. male returns to the clinic today for follow-up visit.  The patient is feeling fine today with no concerning complaints.  He denied having any chest pain, shortness of breath, cough or hemoptysis.  He denied having any recent weight loss or night sweats.  He has no nausea, vomiting, diarrhea or constipation.  He denied having any headache or visual changes.  He had repeat CT scan of the chest performed recently and he is here for evaluation and discussion of his discuss results.   MEDICAL HISTORY: Past Medical History:  Diagnosis Date  . BPH (benign prostatic hypertrophy)   . Cancer ([facility])    lung  . COPD (chronic obstructive pulmonary disease) ([facility])    emphysema  . Frequency of urination   . History of lung cancer    STAGE 1A NON-SMALL CELL LUNG CARCINOMA ---  04-10-2009   S/P RIGHT UPPER LOBECTOMY , NO CHEMORADIATION--  NO RECURRENCE  (ONCOLOGIST-- DR [facility])  . Hyperlipidemia   . Hypertension   . Pulmonary nodule seen on imaging study    STABLE LEFT LOWER LOBE NODULE  PER CHEST CT--  MONITORED BY DR [facility]  . Sleep apnea    OSA does not use CPAP  . Type 2 diabetes mellitus ([facility])   . Urgency of urination     ALLERGIES:  has No Known Allergies.  MEDICATIONS:  Current Outpatient Medications  Medication Sig Dispense Refill  . allopurinol (ZYLOPRIM) 100 MG tablet     . amLODipine-benazepril (LOTREL) 5-20 MG per capsule Take 1 capsule by mouth every morning.     Marland Kitchen aspirin EC 81 MG tablet Take by  mouth.    . Aspirin-Salicylamide-Caffeine (BC HEADACHE POWDER PO) Take by mouth.    . cholecalciferol (VITAMIN D) 1000 UNITS tablet Take 1,000 Units by mouth daily.    . colchicine 0.6 MG tablet     . fluticasone (FLONASE) 50 MCG/ACT nasal spray SPRAY 1 SPRAY INTO EACH NOSTRIL EVERY DAY    . fluticasone furoate-vilanterol (BREO ELLIPTA) 100-25 MCG/INH AEPB INHALE 1 PUFF BY MOUTH ONCE A DAY    . glyBURIDE (DIABETA) 5 MG tablet Take 5 mg by mouth daily.    . indomethacin (INDOCIN) 50 MG capsule Take 50 mg by mouth 2 (two) times daily.  3  . simvastatin (ZOCOR) 20 MG tablet 1 TABLET IN THE EVENING ONCE A DAY ORALLY 90 DAYS  1  . sitaGLIPtan-metformin (JANUMET) 50-500 MG per tablet Take 1 tablet by mouth 2 (two) times daily with a meal.      No current facility-administered medications for this visit.    SURGICAL HISTORY:  Past Surgical History:  Procedure Laterality Date  . CARDIOVASCULAR STRESS TEST  04-08-2009   NORMAL NUCLEAR STUDY/ NO ISCHEMIA/ EF 60%  . CARPAL TUNNEL RELEASE Right   . COLONOSCOPY  last 2019  . CYST EXCISION Left 10/03/2018   Procedure: EXCISION OF LEFT AXILLARY SEBACEOUS CYST;  Surgeon: Coralie Keens, MD;  Location: [facility];  Service: General;  Laterality: Left;  .  excision of abcess    . KNEE ARTHROSCOPY W/ MENISCECTOMY Left 10-31-2004  . LEFT SHOULDER ARTHROSCOPY/ ACROMIOPLASTY/ DECOMPRESSION/ SYNOVECTOMY  11-20-2007  . POLYPECTOMY    . TRANSURETHRAL RESECTION OF PROSTATE N/A 11/03/2013   Procedure: TRANSURETHRAL RESECTION OF THE PROSTATE WITH GYRUS INSTRUMENTS;  Surgeon: Franchot Gallo, MD;  Location: [facility];  Service: Urology;  Laterality: N/A;  . TRIGGER FINGER RELEASE Right 2019  . VIDEO ASSISTED THORACOSCOPY (VATS)/ LOBECTOMY Right 04-10-2009  DR Arlyce Dice   RIGHT UPPER LOBECTOMY AND NODE DISSECTION    REVIEW OF SYSTEMS:  A comprehensive review of systems was negative.   PHYSICAL EXAMINATION: General appearance:  alert, cooperative and no distress Head: Normocephalic, without obvious abnormality, atraumatic Neck: no adenopathy, supple, symmetrical, trachea midline and thyroid not enlarged, symmetric, no tenderness/mass/nodules Lymph nodes: Cervical, supraclavicular, and axillary nodes normal. Resp: clear to auscultation bilaterally and normal percussion bilaterally Back: symmetric, no curvature. ROM normal. No CVA tenderness. Cardio: regular rate and rhythm, S1, S2 normal, no murmur, click, rub or gallop GI: soft, non-tender; bowel sounds normal; no masses,  no organomegaly Extremities: extremities normal, atraumatic, no cyanosis or edema  ECOG PERFORMANCE STATUS: 0 - Asymptomatic  Blood pressure (!) 149/88, pulse 71, temperature 98 F (36.7 C), resp. rate 18, weight 233 lb (105.7 kg), SpO2 100 %.  LABORATORY DATA: Lab Results  Component Value Date   WBC 8.2 04/13/2020   HGB 14.0 04/13/2020   HCT 44.4 04/13/2020   MCV 83.6 04/13/2020   PLT 242 04/13/2020      Chemistry      Component Value Date/Time   NA 139 04/13/2020 0929   NA 142 08/24/2015 1100   K 4.0 04/13/2020 0929   K 4.0 08/24/2015 1100   CL 108 04/13/2020 0929   CL 108 (H) 02/11/2013 0811   CO2 22 04/13/2020 0929   CO2 26 08/24/2015 1100   BUN 15 04/13/2020 0929   BUN 10.2 08/24/2015 1100   CREATININE 0.98 04/13/2020 0929   CREATININE 0.8 08/24/2015 1100      Component Value Date/Time   CALCIUM 9.0 04/13/2020 0929   CALCIUM 9.0 08/24/2015 1100   ALKPHOS 63 04/13/2020 0929   ALKPHOS 63 08/24/2015 1100   AST 33 04/13/2020 0929   AST 29 08/24/2015 1100   ALT 58 (H) 04/13/2020 0929   ALT 41 08/24/2015 1100   BILITOT 0.4 04/13/2020 0929   BILITOT 0.25 08/24/2015 1100       RADIOGRAPHIC STUDIES: CT Chest W Contrast  Result Date: 04/13/2020 CLINICAL DATA:  Primary Cancer Type: Lung Imaging Indication: Routine surveillance Interval therapy since last imaging? No Initial Cancer Diagnosis Date: 04/30/2009     Established by: Biopsy-proven Detailed Pathology: Stage IA non-small cell lung cancer, adenocarcinoma Primary Tumor location: Right upper lobe Surgeries: Right upper lobectomy 04/30/2009 Chemotherapy: No Immunotherapy? No Radiation therapy? No EXAM: CT CHEST WITH CONTRAST TECHNIQUE: Multidetector CT imaging of the chest was performed during intravenous contrast administration. CONTRAST:  12mL OMNIPAQUE IOHEXOL 300 MG/ML  SOLN COMPARISON:  Most recent CT chest 04/11/2019.  07/02/2012 PET-CT. FINDINGS: Cardiovascular: Normal heart size. No significant pericardial effusion/thickening. Atherosclerotic nonaneurysmal thoracic aorta. Normal caliber pulmonary arteries. No central pulmonary emboli. Mediastinum/Nodes: No discrete thyroid nodules. Unremarkable esophagus. No pathologically enlarged axillary, mediastinal or hilar lymph nodes. Lungs/Pleura: No pneumothorax. No pleural effusion. Right upper lobectomy. Scattered small subpleural nodules in the basilar right lower lobe, largest 4 mm (series 7/image 108), all stable. Stable irregular parenchymal band in right mid lung compatible with nonspecific  scarring. Superior segment left lower lobe ground-glass 1.3 x 1.1 cm pulmonary nodule (series 7/image 36), previously 1.1 x 0.8 cm, increased. Central left lower lobe subsolid 2.3 x 2.3 cm pulmonary nodule (series 7/image 77), previously 2.1 x 1.8 cm using similar measurement technique, increased. Additional smaller indistinct peripheral left lower lobe ground-glass pulmonary nodules are not substantially changed. No new significant pulmonary nodules. Upper abdomen: No acute abnormality. Musculoskeletal: No aggressive appearing focal osseous lesions. Mild thoracic spondylosis. IMPRESSION: 1. Enlarging subsolid 2.3 cm central left lower lobe pulmonary nodule. Enlarging ground-glass 1.3 cm superior segment left lower lobe pulmonary nodule. Findings are worrisome for slow growing multifocal left lower lobe lung adenocarcinoma.  2. Stable postsurgical changes from right upper lobectomy with no evidence of local tumor recurrence in the right lung. 3. No thoracic adenopathy. 4. Aortic Atherosclerosis (ICD10-I70.0). Electronically Signed   By: Ilona Sorrel M.D.   On: 04/13/2020 11:50    ASSESSMENT AND PLAN: This is a very pleasant 74 years old [place] American male with stage IA non-small cell lung cancer status post right upper lobectomy and has been observation since May of 2010 daily with no evidence for disease recurrence. The patient is currently on observation and he is feeling fine. He had repeat CT scan of the chest performed recently.  I personally and independently reviewed the scan and discussed the results with the patient today.  He continues to have enlarging subsolid central left lower lobe as well as groundglass opacity in the left lower lobe concerning for disease recurrence. I recommended for the patient to have a PET scan performed for further evaluation of this lesion. I will see him back for follow-up visit in 2 weeks for reevaluation and discussion of his treatment options based on the PET scan results. He was advised to call immediately if he has any concerning symptoms in the interval. The patient voices understanding of current disease status and treatment options and is in agreement with the current care plan.  All questions were answered. The patient knows to call the clinic with any problems, questions or concerns. We can certainly see the patient much sooner if necessary.  Disclaimer: This note was dictated with voice recognition software. Similar sounding words can inadvertently be transcribed and may be missed upon review.

## 2020-04-15 ENCOUNTER — Inpatient Hospital Stay: Payer: Medicare PPO | Admitting: Internal Medicine

## 2020-04-15 ENCOUNTER — Ambulatory Visit: Payer: Medicare PPO | Admitting: Internal Medicine

## 2020-04-19 ENCOUNTER — Other Ambulatory Visit: Payer: Self-pay

## 2020-04-19 ENCOUNTER — Encounter (INDEPENDENT_AMBULATORY_CARE_PROVIDER_SITE_OTHER): Payer: Medicare PPO | Admitting: Ophthalmology

## 2020-04-19 DIAGNOSIS — H43813 Vitreous degeneration, bilateral: Secondary | ICD-10-CM

## 2020-04-19 DIAGNOSIS — H2513 Age-related nuclear cataract, bilateral: Secondary | ICD-10-CM

## 2020-04-19 DIAGNOSIS — H35033 Hypertensive retinopathy, bilateral: Secondary | ICD-10-CM

## 2020-04-19 DIAGNOSIS — I10 Essential (primary) hypertension: Secondary | ICD-10-CM | POA: Diagnosis not present

## 2020-04-19 DIAGNOSIS — H33303 Unspecified retinal break, bilateral: Secondary | ICD-10-CM | POA: Diagnosis not present

## 2020-04-20 ENCOUNTER — Telehealth: Payer: Self-pay | Admitting: Internal Medicine

## 2020-04-20 NOTE — Telephone Encounter (Signed)
R/s appt per 5/18 sch message - unable to reach pt but left message with new appt date and time

## 2020-04-27 ENCOUNTER — Ambulatory Visit: Payer: Medicare PPO | Admitting: Internal Medicine

## 2020-04-29 ENCOUNTER — Other Ambulatory Visit: Payer: Self-pay

## 2020-04-29 ENCOUNTER — Encounter (HOSPITAL_COMMUNITY)
Admission: RE | Admit: 2020-04-29 | Discharge: 2020-04-29 | Disposition: A | Discharge status: Home / Self Care | Payer: Medicare PPO | Source: Ambulatory Visit | Attending: Internal Medicine | Admitting: Internal Medicine

## 2020-04-29 DIAGNOSIS — R918 Other nonspecific abnormal finding of lung field: Secondary | ICD-10-CM | POA: Insufficient documentation

## 2020-04-29 DIAGNOSIS — Z79899 Other long term (current) drug therapy: Secondary | ICD-10-CM | POA: Diagnosis not present

## 2020-04-29 DIAGNOSIS — C3490 Malignant neoplasm of unspecified part of unspecified bronchus or lung: Secondary | ICD-10-CM | POA: Diagnosis not present

## 2020-04-29 LAB — GLUCOSE, CAPILLARY: Glucose-Capillary: 148 mg/dL — ABNORMAL HIGH (ref 70–99)

## 2020-04-29 MED ORDER — FLUDEOXYGLUCOSE F - 18 (FDG) INJECTION
11.6000 | Freq: Once | INTRAVENOUS | Status: AC | PRN
  Administered 2020-04-29: 11.6 via INTRAVENOUS

## 2020-05-04 ENCOUNTER — Other Ambulatory Visit: Payer: Self-pay

## 2020-05-04 ENCOUNTER — Encounter: Payer: Self-pay | Admitting: Internal Medicine

## 2020-05-04 ENCOUNTER — Inpatient Hospital Stay: Payer: Medicare PPO | Attending: Internal Medicine | Admitting: Internal Medicine

## 2020-05-04 VITALS — BP 165/83 | HR 78 | Temp 97.9°F | Resp 18 | Ht 71.0 in | Wt 232.4 lb

## 2020-05-04 DIAGNOSIS — C3490 Malignant neoplasm of unspecified part of unspecified bronchus or lung: Secondary | ICD-10-CM | POA: Diagnosis not present

## 2020-05-04 DIAGNOSIS — C3411 Malignant neoplasm of upper lobe, right bronchus or lung: Secondary | ICD-10-CM | POA: Diagnosis not present

## 2020-05-04 DIAGNOSIS — Z85118 Personal history of other malignant neoplasm of bronchus and lung: Secondary | ICD-10-CM | POA: Insufficient documentation

## 2020-05-04 NOTE — Progress Notes (Signed)
[place] [phone]   [phone]  OFFICE PROGRESS NOTE  Seward Carol, MD [address] [place] [address]  PRINCIPAL DIAGNOSIS: Stage IA (T1a N0 M0) non-small cell lung cancer, adenocarcinoma with bronchoalveolar features diagnosed in May 2010.   PRIOR THERAPY: Status post right upper lobectomy on Apr 30, 2009 under the care of Dr. Arlyce Dice.   CURRENT THERAPY: Observation.  INTERVAL HISTORY: Bobby [name] 74 y.o. male returns to the clinic today for follow-up visit.  The patient is feeling fine today with no concerning complaints except for mild shortness of breath with exertion.  His blood pressure was elevated today because of anxiety about the scan results.  He denied having any current chest pain, cough or hemoptysis.  He denied having any fever or chills.  He has no nausea, vomiting, diarrhea or constipation.  He has no headache or visual changes.  He was found on previous CT scan of the chest to have enlarging subsolid left lower lobe pulmonary nodule.  The patient had a PET scan performed recently and he is here for evaluation and discussion of his scan results and further recommendation regarding his condition.  MEDICAL HISTORY: Past Medical History:  Diagnosis Date  . BPH (benign prostatic hypertrophy)   . Cancer ([place])    lung  . COPD (chronic obstructive pulmonary disease) (HCC)    emphysema  . Frequency of urination   . History of lung cancer    STAGE 1A NON-SMALL CELL LUNG CARCINOMA ---  04-10-2009   S/P RIGHT UPPER LOBECTOMY , NO CHEMORADIATION--  NO RECURRENCE  (ONCOLOGIST-- DR [facility])  . Hyperlipidemia   . Hypertension   . Pulmonary nodule seen on imaging study    STABLE LEFT LOWER LOBE NODULE  PER CHEST CT--  MONITORED BY DR [facility]  . Sleep apnea    OSA does not use CPAP  . Type 2 diabetes mellitus ([place])   . Urgency of urination     ALLERGIES:  has No Known Allergies.  MEDICATIONS:  Current  Outpatient Medications  Medication Sig Dispense Refill  . allopurinol (ZYLOPRIM) 100 MG tablet     . amLODipine-benazepril (LOTREL) 5-20 MG per capsule Take 1 capsule by mouth every morning.     Marland Kitchen aspirin EC 81 MG tablet Take by mouth.    . Aspirin-Salicylamide-Caffeine (BC HEADACHE POWDER PO) Take by mouth.    . cholecalciferol (VITAMIN D) 1000 UNITS tablet Take 1,000 Units by mouth daily.    . colchicine 0.6 MG tablet     . fluticasone (FLONASE) 50 MCG/ACT nasal spray SPRAY 1 SPRAY INTO EACH NOSTRIL EVERY DAY    . glyBURIDE (DIABETA) 5 MG tablet Take 5 mg by mouth daily.    . simvastatin (ZOCOR) 20 MG tablet 1 TABLET IN THE EVENING ONCE A DAY ORALLY 90 DAYS  1  . sitaGLIPtan-metformin (JANUMET) 50-500 MG per tablet Take 1 tablet by mouth 2 (two) times daily with a meal.      No current facility-administered medications for this visit.    SURGICAL HISTORY:  Past Surgical History:  Procedure Laterality Date  . CARDIOVASCULAR STRESS TEST  04-08-2009   NORMAL NUCLEAR STUDY/ NO ISCHEMIA/ EF 60%  . CARPAL TUNNEL RELEASE Right   . COLONOSCOPY  last 2019  . CYST EXCISION Left 10/03/2018   Procedure: EXCISION OF LEFT AXILLARY SEBACEOUS CYST;  Surgeon: Coralie Keens, MD;  Location: [place];  Service: General;  Laterality: Left;  .  excision of abcess    . KNEE ARTHROSCOPY W/ MENISCECTOMY Left 10-31-2004  . LEFT SHOULDER ARTHROSCOPY/ ACROMIOPLASTY/ DECOMPRESSION/ SYNOVECTOMY  11-20-2007  . POLYPECTOMY    . TRANSURETHRAL RESECTION OF PROSTATE N/A 11/03/2013   Procedure: TRANSURETHRAL RESECTION OF THE PROSTATE WITH GYRUS INSTRUMENTS;  Surgeon: Franchot Gallo, MD;  Location: [facility];  Service: Urology;  Laterality: N/A;  . TRIGGER FINGER RELEASE Right 2019  . VIDEO ASSISTED THORACOSCOPY (VATS)/ LOBECTOMY Right 04-10-2009  DR BURNEY   RIGHT UPPER LOBECTOMY AND NODE DISSECTION    REVIEW OF SYSTEMS:  A comprehensive review of systems was negative except  for: Respiratory: positive for dyspnea on exertion   PHYSICAL EXAMINATION: General appearance: alert, cooperative and no distress Head: Normocephalic, without obvious abnormality, atraumatic Neck: no adenopathy, supple, symmetrical, trachea midline and thyroid not enlarged, symmetric, no tenderness/mass/nodules Lymph nodes: Cervical, supraclavicular, and axillary nodes normal. Resp: clear to auscultation bilaterally and normal percussion bilaterally Back: symmetric, no curvature. ROM normal. No CVA tenderness. Cardio: regular rate and rhythm, S1, S2 normal, no murmur, click, rub or gallop GI: soft, non-tender; bowel sounds normal; no masses,  no organomegaly Extremities: extremities normal, atraumatic, no cyanosis or edema  ECOG PERFORMANCE STATUS: 0 - Asymptomatic  Blood pressure (!) 165/83, pulse 78, temperature 97.9 F (36.6 C), temperature source Temporal, resp. rate 18, height 5\' 11"  (1.803 m), weight 232 lb 6.4 oz (105.4 kg), SpO2 100 %.  LABORATORY DATA: Lab Results  Component Value Date   WBC 8.2 04/13/2020   HGB 14.0 04/13/2020   HCT 44.4 04/13/2020   MCV 83.6 04/13/2020   PLT 242 04/13/2020      Chemistry      Component Value Date/Time   NA 139 04/13/2020 0929   NA 142 08/24/2015 1100   K 4.0 04/13/2020 0929   K 4.0 08/24/2015 1100   CL 108 04/13/2020 0929   CL 108 (H) 02/11/2013 0811   CO2 22 04/13/2020 0929   CO2 26 08/24/2015 1100   BUN 15 04/13/2020 0929   BUN 10.2 08/24/2015 1100   CREATININE 0.98 04/13/2020 0929   CREATININE 0.8 08/24/2015 1100      Component Value Date/Time   CALCIUM 9.0 04/13/2020 0929   CALCIUM 9.0 08/24/2015 1100   ALKPHOS 63 04/13/2020 0929   ALKPHOS 63 08/24/2015 1100   AST 33 04/13/2020 0929   AST 29 08/24/2015 1100   ALT 58 (H) 04/13/2020 0929   ALT 41 08/24/2015 1100   BILITOT 0.4 04/13/2020 0929   BILITOT 0.25 08/24/2015 1100       RADIOGRAPHIC STUDIES: CT Chest W Contrast  Result Date: 04/13/2020 CLINICAL DATA:   Primary Cancer Type: Lung Imaging Indication: Routine surveillance Interval therapy since last imaging? No Initial Cancer Diagnosis Date: 04/30/2009    Established by: Biopsy-proven Detailed Pathology: Stage IA non-small cell lung cancer, adenocarcinoma Primary Tumor location: Right upper lobe Surgeries: Right upper lobectomy 04/30/2009 Chemotherapy: No Immunotherapy? No Radiation therapy? No EXAM: CT CHEST WITH CONTRAST TECHNIQUE: Multidetector CT imaging of the chest was performed during intravenous contrast administration. CONTRAST:  64mL OMNIPAQUE IOHEXOL 300 MG/ML  SOLN COMPARISON:  Most recent CT chest 04/11/2019.  07/02/2012 PET-CT. FINDINGS: Cardiovascular: Normal heart size. No significant pericardial effusion/thickening. Atherosclerotic nonaneurysmal thoracic aorta. Normal caliber pulmonary arteries. No central pulmonary emboli. Mediastinum/Nodes: No discrete thyroid nodules. Unremarkable esophagus. No pathologically enlarged axillary, mediastinal or hilar lymph nodes. Lungs/Pleura: No pneumothorax. No pleural effusion. Right upper lobectomy. Scattered small subpleural nodules in the basilar right lower lobe, largest  4 mm (series 7/image 108), all stable. Stable irregular parenchymal band in right mid lung compatible with nonspecific scarring. Superior segment left lower lobe ground-glass 1.3 x 1.1 cm pulmonary nodule (series 7/image 36), previously 1.1 x 0.8 cm, increased. Central left lower lobe subsolid 2.3 x 2.3 cm pulmonary nodule (series 7/image 77), previously 2.1 x 1.8 cm using similar measurement technique, increased. Additional smaller indistinct peripheral left lower lobe ground-glass pulmonary nodules are not substantially changed. No new significant pulmonary nodules. Upper abdomen: No acute abnormality. Musculoskeletal: No aggressive appearing focal osseous lesions. Mild thoracic spondylosis. IMPRESSION: 1. Enlarging subsolid 2.3 cm central left lower lobe pulmonary nodule. Enlarging  ground-glass 1.3 cm superior segment left lower lobe pulmonary nodule. Findings are worrisome for slow growing multifocal left lower lobe lung adenocarcinoma. 2. Stable postsurgical changes from right upper lobectomy with no evidence of local tumor recurrence in the right lung. 3. No thoracic adenopathy. 4. Aortic Atherosclerosis (ICD10-I70.0). Electronically Signed   By: Ilona Sorrel M.D.   On: 04/13/2020 11:50   NM PET Image Restag (PS) Skull Base To Thigh  Result Date: 04/29/2020 CLINICAL DATA:  Subsequent treatment strategy for non-small cell lung cancer. EXAM: NUCLEAR MEDICINE PET SKULL BASE TO THIGH TECHNIQUE: 11.6 mCi F-18 FDG was injected intravenously. Full-ring PET imaging was performed from the skull base to thigh after the radiotracer. CT data was obtained and used for attenuation correction and anatomic localization. Fasting blood glucose: 148 mg/dl COMPARISON:  Chest CT May 11 21 FINDINGS: Mediastinal blood pool activity: SUV max 3.2 Liver activity: SUV max 3.6 NECK: No hypermetabolic lymph nodes in the neck. Incidental CT findings: none CHEST: Within the LEFT lower lobe nodular within LEFT lower lobe low cellularity nodule measures 1.7 cm by 1.8 cm which compares to 2.3 x 2.3 cm on prior. There is differing technique. This lesion has low metabolic activity SUV max equal 2.1. More superiorly in the LEFT lower lobe, ground-glass nodule measuring 0.9 cm (image 54/4) is compares to 1.3 cm on comparison exam and has no metabolic activity. Again differing technique Incidental CT findings: Post surgical change related to RIGHT upper lobectomy. ABDOMEN/PELVIS: No abnormal hypermetabolic activity within the liver, pancreas, adrenal glands, or spleen. No hypermetabolic lymph nodes in the abdomen or pelvis. Incidental CT findings: Atherosclerotic calcification of the aorta. SKELETON: No focal hypermetabolic activity to suggest skeletal metastasis. Incidental CT findings: none IMPRESSION: 1. Low metabolic  activity associated with sub solid nodule in the LEFT lower lobe. Findings remain concerning for slow growing adenocarcinoma. Lesion was documented as enlarging on recent CT scan. Lesions measure larger on comparison CT presumably related to technique. 2. No metabolic activity associated with a smaller sub solid LEFT lower lobe nodule. Lesion also remains concerning due to documented growth on prior CT. Electronically Signed   By: Suzy Bouchard M.D.   On: 04/29/2020 09:22    ASSESSMENT AND PLAN: This is a very pleasant 74 years old [place] American male with stage IA non-small cell lung cancer status post right upper lobectomy and has been observation since May of 2010 daily with no evidence for disease recurrence. The patient has been on observation since that time and he is feeling fine. He was found on recent CT scan of the chest to have enlarging subsolid central left lower lobe as well as groundglass opacity in the left lower lobe concerning for disease recurrence. I ordered a PET scan which showed no concerning hypermetabolic activity in that area but still concerning for slow-growing adenocarcinoma. I recommended  for the patient to continue on observation with repeat CT scan of the chest in 6 months for restaging of his disease. The patient was advised to call immediately if he has any concerning symptoms in the interval. The patient voices understanding of current disease status and treatment options and is in agreement with the current care plan.  All questions were answered. The patient knows to call the clinic with any problems, questions or concerns. We can certainly see the patient much sooner if necessary.  Disclaimer: This note was dictated with voice recognition software. Similar sounding words can inadvertently be transcribed and may be missed upon review.

## 2020-05-06 ENCOUNTER — Telehealth: Payer: Self-pay | Admitting: Internal Medicine

## 2020-05-06 NOTE — Telephone Encounter (Signed)
Scheduled appt per 6/1 los - left message and mailed reminder letter

## 2020-05-14 DIAGNOSIS — C678 Malignant neoplasm of overlapping sites of bladder: Secondary | ICD-10-CM | POA: Diagnosis not present

## 2020-05-14 DIAGNOSIS — N401 Enlarged prostate with lower urinary tract symptoms: Secondary | ICD-10-CM | POA: Diagnosis not present

## 2020-05-14 DIAGNOSIS — R351 Nocturia: Secondary | ICD-10-CM | POA: Diagnosis not present

## 2020-06-15 DIAGNOSIS — I10 Essential (primary) hypertension: Secondary | ICD-10-CM | POA: Diagnosis not present

## 2020-06-15 DIAGNOSIS — Z8551 Personal history of malignant neoplasm of bladder: Secondary | ICD-10-CM | POA: Diagnosis not present

## 2020-06-15 DIAGNOSIS — E1169 Type 2 diabetes mellitus with other specified complication: Secondary | ICD-10-CM | POA: Diagnosis not present

## 2020-06-15 DIAGNOSIS — I700 Atherosclerosis of aorta: Secondary | ICD-10-CM | POA: Diagnosis not present

## 2020-06-15 DIAGNOSIS — M109 Gout, unspecified: Secondary | ICD-10-CM | POA: Diagnosis not present

## 2020-06-15 DIAGNOSIS — E7800 Pure hypercholesterolemia, unspecified: Secondary | ICD-10-CM | POA: Diagnosis not present

## 2020-09-20 DIAGNOSIS — Z23 Encounter for immunization: Secondary | ICD-10-CM | POA: Diagnosis not present

## 2020-11-01 ENCOUNTER — Other Ambulatory Visit: Payer: Self-pay

## 2020-11-01 ENCOUNTER — Encounter (HOSPITAL_COMMUNITY): Payer: Self-pay

## 2020-11-01 ENCOUNTER — Ambulatory Visit (HOSPITAL_COMMUNITY)
Admission: RE | Admit: 2020-11-01 | Discharge: 2020-11-01 | Disposition: A | Discharge status: Home / Self Care | Payer: Medicare PPO | Source: Ambulatory Visit | Attending: Internal Medicine | Admitting: Internal Medicine

## 2020-11-01 ENCOUNTER — Inpatient Hospital Stay: Payer: Medicare PPO | Attending: Internal Medicine

## 2020-11-01 DIAGNOSIS — J432 Centrilobular emphysema: Secondary | ICD-10-CM | POA: Diagnosis not present

## 2020-11-01 DIAGNOSIS — C3490 Malignant neoplasm of unspecified part of unspecified bronchus or lung: Secondary | ICD-10-CM

## 2020-11-01 DIAGNOSIS — M47812 Spondylosis without myelopathy or radiculopathy, cervical region: Secondary | ICD-10-CM | POA: Diagnosis not present

## 2020-11-01 DIAGNOSIS — J929 Pleural plaque without asbestos: Secondary | ICD-10-CM | POA: Diagnosis not present

## 2020-11-01 DIAGNOSIS — Z85118 Personal history of other malignant neoplasm of bronchus and lung: Secondary | ICD-10-CM | POA: Diagnosis not present

## 2020-11-01 LAB — CMP (CANCER CENTER ONLY)
ALT: 51 U/L — ABNORMAL HIGH (ref 0–44)
AST: 43 U/L — ABNORMAL HIGH (ref 15–41)
Albumin: 3.5 g/dL (ref 3.5–5.0)
Alkaline Phosphatase: 57 U/L (ref 38–126)
Anion gap: 10 (ref 5–15)
BUN: 12 mg/dL (ref 8–23)
CO2: 22 mmol/L (ref 22–32)
Calcium: 9.3 mg/dL (ref 8.9–10.3)
Chloride: 110 mmol/L (ref 98–111)
Creatinine: 0.93 mg/dL (ref 0.61–1.24)
GFR, Estimated: >60 mL/min (ref >60)
Glucose, Bld: 101 mg/dL — ABNORMAL HIGH (ref 70–99)
Potassium: 3.7 mmol/L (ref 3.5–5.1)
Sodium: 142 mmol/L (ref 135–145)
Total Bilirubin: 0.3 mg/dL (ref 0.3–1.2)
Total Protein: 7 g/dL (ref 6.5–8.1)

## 2020-11-01 LAB — CBC WITH DIFFERENTIAL (CANCER CENTER ONLY)
Abs Immature Granulocytes: 0.01 10*3/uL (ref 0.00–0.07)
Basophils Absolute: 0 10*3/uL (ref 0.0–0.1)
Basophils Relative: 0 %
Eosinophils Absolute: 0.2 10*3/uL (ref 0.0–0.5)
Eosinophils Relative: 4 %
HCT: 42.5 % (ref 39.0–52.0)
Hemoglobin: 13.5 g/dL (ref 13.0–17.0)
Immature Granulocytes: 0 %
Lymphocytes Relative: 29 %
Lymphs Abs: 1.7 10*3/uL (ref 0.7–4.0)
MCH: 26.6 pg (ref 26.0–34.0)
MCHC: 31.8 g/dL (ref 30.0–36.0)
MCV: 83.7 fL (ref 80.0–100.0)
Monocytes Absolute: 0.8 10*3/uL (ref 0.1–1.0)
Monocytes Relative: 13 %
Neutro Abs: 3.1 10*3/uL (ref 1.7–7.7)
Neutrophils Relative %: 54 %
Platelet Count: 218 10*3/uL (ref 150–400)
RBC: 5.08 MIL/uL (ref 4.22–5.81)
RDW: 15.1 % (ref 11.5–15.5)
WBC Count: 5.7 10*3/uL (ref 4.0–10.5)
nRBC: 0 % (ref 0.0–0.2)

## 2020-11-01 MED ORDER — IOHEXOL 300 MG/ML  SOLN
75.0000 mL | Freq: Once | INTRAMUSCULAR | Status: AC | PRN
  Administered 2020-11-01: 75 mL via INTRAVENOUS

## 2020-11-03 ENCOUNTER — Encounter: Payer: Self-pay | Admitting: Internal Medicine

## 2020-11-03 ENCOUNTER — Inpatient Hospital Stay: Payer: Medicare PPO | Attending: Internal Medicine | Admitting: Internal Medicine

## 2020-11-03 ENCOUNTER — Other Ambulatory Visit: Payer: Self-pay

## 2020-11-03 VITALS — BP 149/81 | HR 73 | Temp 98.0°F | Resp 20 | Ht 71.0 in | Wt 233.4 lb

## 2020-11-03 DIAGNOSIS — N400 Enlarged prostate without lower urinary tract symptoms: Secondary | ICD-10-CM | POA: Diagnosis not present

## 2020-11-03 DIAGNOSIS — I700 Atherosclerosis of aorta: Secondary | ICD-10-CM | POA: Insufficient documentation

## 2020-11-03 DIAGNOSIS — C3490 Malignant neoplasm of unspecified part of unspecified bronchus or lung: Secondary | ICD-10-CM | POA: Diagnosis not present

## 2020-11-03 DIAGNOSIS — C3411 Malignant neoplasm of upper lobe, right bronchus or lung: Secondary | ICD-10-CM

## 2020-11-03 DIAGNOSIS — G4730 Sleep apnea, unspecified: Secondary | ICD-10-CM | POA: Diagnosis not present

## 2020-11-03 DIAGNOSIS — Z7982 Long term (current) use of aspirin: Secondary | ICD-10-CM | POA: Diagnosis not present

## 2020-11-03 DIAGNOSIS — E785 Hyperlipidemia, unspecified: Secondary | ICD-10-CM | POA: Diagnosis not present

## 2020-11-03 DIAGNOSIS — Z79899 Other long term (current) drug therapy: Secondary | ICD-10-CM | POA: Diagnosis not present

## 2020-11-03 DIAGNOSIS — E119 Type 2 diabetes mellitus without complications: Secondary | ICD-10-CM | POA: Insufficient documentation

## 2020-11-03 DIAGNOSIS — J449 Chronic obstructive pulmonary disease, unspecified: Secondary | ICD-10-CM | POA: Diagnosis not present

## 2020-11-03 DIAGNOSIS — Z85118 Personal history of other malignant neoplasm of bronchus and lung: Secondary | ICD-10-CM | POA: Diagnosis not present

## 2020-11-03 DIAGNOSIS — I10 Essential (primary) hypertension: Secondary | ICD-10-CM | POA: Diagnosis not present

## 2020-11-03 NOTE — Progress Notes (Signed)
[place] Telephone:([phone]   Fax:(336) [phone]  OFFICE PROGRESS NOTE  Seward Carol, MD 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   PRIOR THERAPY: Status post right upper lobectomy on Apr 30, 2009 under the care of Dr. Arlyce Dice.   CURRENT THERAPY: Observation.  INTERVAL HISTORY: Bobby [name] 74 y.o. [name] returns to the clinic today for follow-up visit.  The patient is feeling fine today with no concerning complaints.  He denied having any chest pain, shortness of breath, cough or hemoptysis.  He denied having any fever or chills.  He has no nausea, vomiting, diarrhea or constipation.  He has no headache or visual changes.  He is here today for evaluation and repeat CT scan of the chest for restaging of his disease.   MEDICAL HISTORY: Past Medical History:  Diagnosis Date  . BPH (benign prostatic hypertrophy)   . COPD (chronic obstructive pulmonary disease) (HCC)    emphysema  . Frequency of urination   . History of lung cancer    STAGE 1A NON-SMALL CELL LUNG CARCINOMA ---  04-10-2009   S/P RIGHT UPPER LOBECTOMY , NO CHEMORADIATION--  NO RECURRENCE  (ONCOLOGIST-- DR [facility])  . Hyperlipidemia   . Hypertension   . nscl ca dx'd 04/2009   lung  . Pulmonary nodule seen on imaging study    STABLE LEFT LOWER LOBE NODULE  PER CHEST CT--  MONITORED BY DR [facility]  . Sleep apnea    OSA does not use CPAP  . Type 2 diabetes mellitus ([place])   . Urgency of urination     ALLERGIES:  has No Known Allergies.  MEDICATIONS:  Current Outpatient Medications  Medication Sig Dispense Refill  . allopurinol (ZYLOPRIM) 100 MG tablet     . amLODipine-benazepril (LOTREL) 5-20 MG per capsule Take 1 capsule by mouth every morning.     Marland Kitchen aspirin EC 81 MG tablet Take by mouth.    . Aspirin-Salicylamide-Caffeine (BC HEADACHE  POWDER PO) Take by mouth.    . cholecalciferol (VITAMIN D) 1000 UNITS tablet Take 1,000 Units by mouth daily.    . colchicine 0.6 MG tablet     . fluticasone (FLONASE) 50 MCG/ACT nasal spray SPRAY 1 SPRAY INTO EACH NOSTRIL EVERY DAY    . glyBURIDE (DIABETA) 5 MG tablet Take 5 mg by mouth daily.    . simvastatin (ZOCOR) 20 MG tablet 1 TABLET IN THE EVENING ONCE A DAY ORALLY 90 DAYS  1  . sitaGLIPtan-metformin (JANUMET) 50-500 MG per tablet Take 1 tablet by mouth 2 (two) times daily with a meal.      No current facility-administered medications for this visit.    SURGICAL HISTORY:  Past Surgical History:  Procedure Laterality Date  . CARDIOVASCULAR STRESS TEST  04-08-2009   NORMAL NUCLEAR STUDY/ NO ISCHEMIA/ EF 60%  . CARPAL TUNNEL RELEASE Right   . COLONOSCOPY  last 2019  . CYST EXCISION Left 10/03/2018   Procedure: EXCISION OF LEFT AXILLARY SEBACEOUS CYST;  Surgeon: Coralie Keens, MD;  Location: [place];  Service: General;  Laterality: Left;  . excision of abcess    . KNEE ARTHROSCOPY W/ MENISCECTOMY Left 10-31-2004  . LEFT SHOULDER ARTHROSCOPY/ ACROMIOPLASTY/ DECOMPRESSION/ SYNOVECTOMY  11-20-2007  . POLYPECTOMY    . TRANSURETHRAL RESECTION OF PROSTATE N/A 11/03/2013   Procedure: TRANSURETHRAL RESECTION OF THE PROSTATE  WITH GYRUS INSTRUMENTS;  Surgeon: Franchot Gallo, MD;  Location: [facility];  Service: Urology;  Laterality: N/A;  . TRIGGER FINGER RELEASE Right 2019  . VIDEO ASSISTED THORACOSCOPY (VATS)/ LOBECTOMY Right 04-10-2009  DR Arlyce Dice   RIGHT UPPER LOBECTOMY AND NODE DISSECTION    REVIEW OF SYSTEMS:  A comprehensive review of systems was negative.   PHYSICAL EXAMINATION: General appearance: alert, cooperative and no distress Head: Normocephalic, without obvious abnormality, atraumatic Neck: no adenopathy, supple, symmetrical, trachea midline and thyroid not enlarged, symmetric, no tenderness/mass/nodules Lymph nodes: Cervical,  supraclavicular, and axillary nodes normal. Resp: clear to auscultation bilaterally and normal percussion bilaterally Back: symmetric, no curvature. ROM normal. No CVA tenderness. Cardio: regular rate and rhythm, S1, S2 normal, no murmur, click, rub or gallop GI: soft, non-tender; bowel sounds normal; no masses,  no organomegaly Extremities: extremities normal, atraumatic, no cyanosis or edema  ECOG PERFORMANCE STATUS: 0 - Asymptomatic  Blood pressure (!) 149/81, pulse 73, temperature 98 F (36.7 C), temperature source Tympanic, resp. rate 20, height 5\' 11"  (1.803 m), weight 233 lb 6.4 oz (105.9 kg), SpO2 100 %.  LABORATORY DATA: Lab Results  Component Value Date   WBC 5.7 11/01/2020   HGB 13.5 11/01/2020   HCT 42.5 11/01/2020   MCV 83.7 11/01/2020   PLT 218 11/01/2020      Chemistry      Component Value Date/Time   NA 142 11/01/2020 1131   NA 142 08/24/2015 1100   K 3.7 11/01/2020 1131   K 4.0 08/24/2015 1100   CL 110 11/01/2020 1131   CL 108 (H) 02/11/2013 0811   CO2 22 11/01/2020 1131   CO2 26 08/24/2015 1100   BUN 12 11/01/2020 1131   BUN 10.2 08/24/2015 1100   CREATININE 0.93 11/01/2020 1131   CREATININE 0.8 08/24/2015 1100      Component Value Date/Time   CALCIUM 9.3 11/01/2020 1131   CALCIUM 9.0 08/24/2015 1100   ALKPHOS 57 11/01/2020 1131   ALKPHOS 63 08/24/2015 1100   AST 43 (H) 11/01/2020 1131   AST 29 08/24/2015 1100   ALT 51 (H) 11/01/2020 1131   ALT 41 08/24/2015 1100   BILITOT 0.3 11/01/2020 1131   BILITOT 0.25 08/24/2015 1100       RADIOGRAPHIC STUDIES: CT Chest W Contrast  Result Date: 11/01/2020 CLINICAL DATA:  Non-small-cell lung cancer. Adenocarcinoma diagnosed in May of 2010. Right upper lobectomy in 2010. Currently on observation. EXAM: CT CHEST WITH CONTRAST TECHNIQUE: Multidetector CT imaging of the chest was performed during intravenous contrast administration. CONTRAST:  29mL OMNIPAQUE IOHEXOL 300 MG/ML  SOLN COMPARISON:  04/29/2020  PET.  04/13/2020 chest CT. FINDINGS: Cardiovascular: Aortic atherosclerosis. Normal heart size, without pericardial effusion. No central pulmonary embolism, on this non-dedicated study. Mediastinum/Nodes: Mildly prominent right axillary nodes are favored to be reactive and not pathologic by size criteria. No mediastinal or hilar adenopathy. Lungs/Pleura: No pleural fluid. Mild right-sided pleural thickening is unchanged. Right upper lobectomy. Similar volume loss adjacent to surgical sutures, including on 74/7. Mild centrilobular emphysema. Mixed attenuation vague left lower lobe 8 mm nodule on 88/7 is similar to on the prior exam (when remeasured). Just cephalad to this, 2 adjacent ill-defined nodules on the order of 4-7 mm are unchanged. Superior segment left lower lobe sub solid pulmonary nodule measures 2.1 x 2.2 cm on 66/7. Compare 2.3 x 2.3 cm on the prior exam (when remeasured). Superior segment left lower lobe ground-glass pulmonary nodule measures 1.1 cm on 27/7 and is similar  to on the prior exam (when remeasured). Upper Abdomen: Possible mild steatosis. Normal imaged portions of the spleen, stomach, pancreas, right adrenal gland, kidneys. Mild left adrenal thickening. Musculoskeletal: No acute osseous abnormality. Lower thoracic and lower cervical spondylosis. IMPRESSION: 1. Status post right upper lobectomy, without recurrent or metastatic disease. 2. Left lower lobe ground-glass nodules, including 2 dominant unchanged nodules as detailed above. These remain suspicious for indolent primary bronchogenic carcinoma, likely low-grade adenocarcinoma. 3.  Aortic Atherosclerosis (ICD10-I70.0). Electronically Signed   By: Abigail Miyamoto M.D.   On: 11/01/2020 13:52    ASSESSMENT AND PLAN: This is a very pleasant 74 years old Bobby [name] with stage IA non-small cell lung cancer status post right upper lobectomy and has been observation since May of 2010 daily with no evidence for disease recurrence. He  was found on recent CT scan of the chest to have enlarging subsolid central left lower lobe as well as groundglass opacity in the left lower lobe concerning for disease recurrence. PET scan was performed 6 months ago and showed no concerning hypermetabolic activity in that area but still concerning for slow-growing adenocarcinoma. The patient had another CT scan of the chest performed recently.  I personally and independently reviewed the scan images and discussed the results with the patient today. The pulmonary nodules in the left lung are still suspicious for slowly growing primary bronchogenic carcinoma. I recommended for the patient to see Dr. Roxan Hockey from cardiothoracic surgery for consideration of repeat bronchoscopy and evaluation of these nodules and discussion of surgical resection if needed. The patient agreed to the current plan. I will see him back for follow-up visit in 4 months for evaluation with repeat CT scan of the chest. He was advised to call immediately if he has any concerning symptoms in the interval. The patient voices understanding of current disease status and treatment options and is in agreement with the current care plan.  All questions were answered. The patient knows to call the clinic with any problems, questions or concerns. We can certainly see the patient much sooner if necessary.  Disclaimer: This note was dictated with voice recognition software. Similar sounding words can inadvertently be transcribed and may be missed upon review.

## 2020-11-04 ENCOUNTER — Telehealth: Payer: Self-pay | Admitting: Internal Medicine

## 2020-11-04 NOTE — Telephone Encounter (Signed)
Scheduled per los. Called and left msg. Mailed printout  °

## 2020-11-16 ENCOUNTER — Other Ambulatory Visit: Payer: Self-pay | Admitting: Thoracic Surgery (Cardiothoracic Vascular Surgery)

## 2020-11-16 DIAGNOSIS — C3411 Malignant neoplasm of upper lobe, right bronchus or lung: Secondary | ICD-10-CM

## 2020-11-18 ENCOUNTER — Encounter: Payer: Self-pay | Admitting: *Deleted

## 2020-11-18 ENCOUNTER — Other Ambulatory Visit: Payer: Self-pay

## 2020-11-18 ENCOUNTER — Other Ambulatory Visit: Payer: Self-pay | Admitting: *Deleted

## 2020-11-18 ENCOUNTER — Institutional Professional Consult (permissible substitution) (INDEPENDENT_AMBULATORY_CARE_PROVIDER_SITE_OTHER): Payer: Medicare PPO | Admitting: Thoracic Surgery (Cardiothoracic Vascular Surgery)

## 2020-11-18 VITALS — BP 136/76 | HR 78 | Temp 97.6°F | Resp 20 | Ht 71.0 in | Wt 233.0 lb

## 2020-11-18 DIAGNOSIS — R918 Other nonspecific abnormal finding of lung field: Secondary | ICD-10-CM | POA: Diagnosis not present

## 2020-11-18 DIAGNOSIS — Z85118 Personal history of other malignant neoplasm of bronchus and lung: Secondary | ICD-10-CM

## 2020-11-18 DIAGNOSIS — R911 Solitary pulmonary nodule: Secondary | ICD-10-CM

## 2020-11-18 NOTE — H&P (View-Only) (Signed)
PCP is Seward Carol, MD Referring Provider is Curt Bears, MD  Chief Complaint  Patient presents with  . Lung Cancer    Surgical eval, PET Scan 04/29/20, Chest CT 11/01/20    HPI: Mr. Radel sent for consultation regarding left lower lobe lung nodules.  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  Dr. Arlyce Dice did a right upper lobectomy for stage Ia low-grade adenocarcinoma in 2010.  He has been followed since that time.  Going back to at least 2013 he had a central groundglass opacity in the left lower lobe.  More recently he also developed a groundglass opacity in the superior segment of the left lower lobe.  Those nodules have been followed over time.  He had a PET/CT about 6 months ago.  There was no activity in the superior segmental nodule.  There was a low-grade temp of the with an SUV of 2.1 in the central nodule.  That was below the mediastinal blood pool baseline.  He has been feeling well.  He is not having any chest pain, pressure, tightness, or shortness of breath.  His appetite is good.  He denies weight loss.  No headaches or visual changes.  He does have some arthritis but that is stable.  He had about 20-pack-year history of smoking but quit in 2000 when he was diagnosed with bladder cancer.  Zubrod Score: At the time of surgery this patient's most appropriate activity status/level should be described as: []     0    Normal activity, no symptoms [x]     1    Restricted in physical strenuous activity but ambulatory, able to do out light work []     2    Ambulatory and capable of self care, unable to do work activities, up and about >50 % of waking hours                              []     3    Only limited self care, in bed greater than 50% of waking hours []     4     Completely disabled, no self care, confined to bed or chair []     5    Moribund   Past Medical History:  Diagnosis Date  . BPH (benign prostatic hypertrophy)   . COPD (chronic obstructive pulmonary disease) (HCC)    emphysema  . Frequency of urination   . History of lung cancer    STAGE 1A NON-SMALL CELL LUNG CARCINOMA ---  04-10-2009   S/P RIGHT UPPER LOBECTOMY , NO CHEMORADIATION--  NO RECURRENCE  (ONCOLOGIST-- DR [facility])  . Hyperlipidemia   . Hypertension   . nscl ca dx'd 04/2009   lung  . Pulmonary nodule seen on imaging study    STABLE LEFT LOWER LOBE NODULE  PER CHEST CT--  MONITORED BY DR [facility]  . Sleep apnea    OSA does not use CPAP  . Type 2 diabetes mellitus ([place])   . Urgency of urination     Past Surgical History:  Procedure Laterality Date  . CARDIOVASCULAR STRESS TEST  04-08-2009   NORMAL NUCLEAR STUDY/ NO ISCHEMIA/ EF 60%  . CARPAL TUNNEL RELEASE Right   . COLONOSCOPY  last 2019  . CYST EXCISION Left 10/03/2018   Procedure: EXCISION  OF LEFT AXILLARY SEBACEOUS CYST;  Surgeon: Coralie Keens, MD;  Location: [place];  Service: General;  Laterality: Left;  . excision of abcess    . KNEE ARTHROSCOPY W/ MENISCECTOMY Left 10-31-2004  . LEFT SHOULDER ARTHROSCOPY/ ACROMIOPLASTY/ DECOMPRESSION/ SYNOVECTOMY  11-20-2007  . POLYPECTOMY    . TRANSURETHRAL RESECTION OF PROSTATE N/A 11/03/2013   Procedure: TRANSURETHRAL RESECTION OF THE PROSTATE WITH GYRUS INSTRUMENTS;  Surgeon: Franchot Gallo, MD;  Location: [facility];  Service: Urology;  Laterality: N/A;  . TRIGGER FINGER RELEASE Right 2019  . VIDEO ASSISTED THORACOSCOPY (VATS)/ LOBECTOMY Right 04-10-2009  DR Arlyce Dice   RIGHT UPPER LOBECTOMY AND NODE DISSECTION    Family History  Problem Relation Age of Onset  . Colitis Neg Hx   . Colon cancer Neg Hx   . Esophageal cancer Neg Hx   . Rectal cancer Neg Hx   . Stomach cancer Neg Hx     Social History Social History   Tobacco  Use  . Smoking status: Former Smoker    Years: 20.00    Types: Cigarettes    Quit date: 05/28/2000    Years since quitting: 20.4  . Smokeless tobacco: Never Used  Vaping Use  . Vaping Use: Never used  Substance Use Topics  . Alcohol use: Yes    Comment: social/once a month per pt  . Drug use: No    Current Outpatient Medications  Medication Sig Dispense Refill  . allopurinol (ZYLOPRIM) 100 MG tablet     . amLODipine-benazepril (LOTREL) 5-20 MG per capsule Take 1 capsule by mouth every morning.     Marland Kitchen aspirin EC 81 MG tablet Take by mouth.    . Aspirin-Salicylamide-Caffeine (BC HEADACHE POWDER PO) Take by mouth.    . cholecalciferol (VITAMIN D) 1000 UNITS tablet Take 1,000 Units by mouth daily.    . colchicine 0.6 MG tablet     . fluticasone (FLONASE) 50 MCG/ACT nasal spray SPRAY 1 SPRAY INTO EACH NOSTRIL EVERY DAY    . glyBURIDE (DIABETA) 5 MG tablet Take 5 mg by mouth daily.    . simvastatin (ZOCOR) 20 MG tablet 1 TABLET IN THE EVENING ONCE A DAY ORALLY 90 DAYS  1  . sitaGLIPtan-metformin (JANUMET) 50-500 MG per tablet Take 1 tablet by mouth 2 (two) times daily with a meal.     No current facility-administered medications for this visit.    No Known Allergies  Review of Systems  Constitutional: Negative for activity change, fatigue and unexpected weight change.  HENT: Negative for trouble swallowing and voice change.   Eyes: Negative for visual disturbance.  Respiratory: Positive for apnea (Does not use CPAP). Negative for cough, shortness of breath and wheezing.   Cardiovascular: Negative for chest pain and leg swelling.  Gastrointestinal: Negative for abdominal pain.  Genitourinary: Positive for frequency.  Musculoskeletal: Positive for arthralgias and joint swelling.  Neurological: Negative for seizures and weakness.  Hematological: Negative for adenopathy. Does not bruise/bleed easily.  All other systems reviewed and are negative.   BP 136/76   Pulse 78   Temp 97.6  F (36.4 C) (Skin)   Resp 20   Ht 5\' 11"  (1.803 m)   Wt 233 lb (105.7 kg)   SpO2 95% Comment: RA  BMI 32.50 kg/m   Diagnostic Tests: CT CHEST WITH CONTRAST  TECHNIQUE: Multidetector CT imaging of the chest was performed during intravenous contrast administration.  CONTRAST:  35mL OMNIPAQUE IOHEXOL 300 MG/ML  SOLN  COMPARISON:  04/29/2020 PET.  04/13/2020 chest CT.  FINDINGS: Cardiovascular: Aortic atherosclerosis. Normal heart size, without pericardial effusion. No central pulmonary embolism, on this non-dedicated study.  Mediastinum/Nodes: Mildly prominent right axillary nodes are favored to be reactive and not pathologic by size criteria. No mediastinal or hilar adenopathy.  Lungs/Pleura: No pleural fluid. Mild right-sided pleural thickening is unchanged.  Right upper lobectomy.  Similar volume loss adjacent to surgical sutures, including on 74/7.  Mild centrilobular emphysema.  Mixed attenuation vague left lower lobe 8 mm nodule on 88/7 is similar to on the prior exam (when remeasured).  Just cephalad to this, 2 adjacent ill-defined nodules on the order of 4-7 mm are unchanged.  Superior segment left lower lobe sub solid pulmonary nodule measures 2.1 x 2.2 cm on 66/7. Compare 2.3 x 2.3 cm on the prior exam (when remeasured).  Superior segment left lower lobe ground-glass pulmonary nodule measures 1.1 cm on 27/7 and is similar to on the prior exam (when remeasured).  Upper Abdomen: Possible mild steatosis. Normal imaged portions of the spleen, stomach, pancreas, right adrenal gland, kidneys. Mild left adrenal thickening.  Musculoskeletal: No acute osseous abnormality. Lower thoracic and lower cervical spondylosis.  IMPRESSION: 1. Status post right upper lobectomy, without recurrent or metastatic disease. 2. Left lower lobe ground-glass nodules, including 2 dominant unchanged nodules as detailed above. These remain suspicious  for indolent primary bronchogenic carcinoma, likely low-grade adenocarcinoma. 3.  Aortic Atherosclerosis (ICD10-I70.0).   Electronically Signed   By: Abigail Miyamoto M.D.   On: 11/01/2020 13:52 I personally reviewed the CT images and compared them to multiple prior scans from the last 8 years.  I concur with the findings noted above.  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  Left lower lobe lung nodules-he has 2 primarily groundglass nodules in the left lower lobe.  There is a larger more central nodule and then a smaller more peripheral nodule in the superior segment.  These of both been present for several years with a central nodule dating back at least to 2013.  There has been slow progression over time although not much in the way of change over the past 6 months.  We discussed the differential diagnosis of those nodules.  Given his history these are highly suspicious for low-grade adenocarcinomas.  Infectious and inflammatory nodules are also in the differential but are far less likely.  We did discuss that these nodules have been growing very slowly with no signs of any aggressive activity.  He understands that that could change, but they also could remain indolent indefinitely.  Potential treatment options would include surgical resection and stereotactic radiation.  He has had a right upper lobectomy so we would need to check pulmonary function testing to see if he would have adequate pulmonary reserve to tolerate resection.  Resection would require lobectomy due to the central nature of the larger lesion.  I recommended that we do a navigational bronchoscopy to sample both lesions.  I informed her of the general nature of the procedure.  He understands that it  is an endoscopic procedure that we would plan to do in the operating room under general anesthesia.  We will plan to do the procedure on an outpatient basis.  I informed him of the indications, risks, benefits, alternatives, and limitations.  He understands there is no guarantee of a definitive  diagnosis.  He understands the risks include those associated with general anesthesia.  He understands the risks include, but not limited to MI, stroke, DVT, PE, bleeding, pneumothorax, as well as possibility of other unforeseeable complications.  He accepts the risks and wishes to proceed.  Hypertension-blood pressure well controlled on current regimen.  Hyperlipidemia-on simvastatin.  Type 2 diabetes-on glyburide and Janumet.  Plan: Pulmonary function testing with and without bronchodilators Electromagnetic navigational bronchoscopy for biopsy on Thursday, 12/02/2020  Melrose Nakayama, MD [facility] [phone]

## 2020-11-18 NOTE — Progress Notes (Signed)
PCP is Seward Carol, MD Referring Provider is Curt Bears, MD  Chief Complaint  Patient presents with  . Lung Cancer    Surgical eval, PET Scan 04/29/20, Chest CT 11/01/20    HPI: [name] sent for consultation regarding left lower lobe lung nodules.  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  Dr. Arlyce Dice did a right upper lobectomy for stage Ia low-grade adenocarcinoma in 2010.  He has been followed since that time.  Going back to at least 2013 he had a central groundglass opacity in the left lower lobe.  More recently he also developed a groundglass opacity in the superior segment of the left lower lobe.  Those nodules have been followed over time.  He had a PET/CT about 6 months ago.  There was no activity in the superior segmental nodule.  There was a low-grade temp of the with an SUV of 2.1 in the central nodule.  That was below the mediastinal blood pool baseline.  He has been feeling well.  He is not having any chest pain, pressure, tightness, or shortness of breath.  His appetite is good.  He denies weight loss.  No headaches or visual changes.  He does have some arthritis but that is stable.  He had about 20-pack-year history of smoking but quit in 2000 when he was diagnosed with bladder cancer.  Zubrod Score: At the time of surgery this patient's most appropriate activity status/level should be described as: []     0    Normal activity, no symptoms [x]     1    Restricted in physical strenuous activity but ambulatory, able to do out light work []     2    Ambulatory and capable of self care, unable to do work activities, up and about >50 % of waking hours                              []     3    Only limited self care, in bed greater than 50% of waking hours []     4     Completely disabled, no self care, confined to bed or chair []     5    Moribund   Past Medical History:  Diagnosis Date  . BPH (benign prostatic hypertrophy)   . COPD (chronic obstructive pulmonary disease) (HCC)    emphysema  . Frequency of urination   . History of lung cancer    STAGE 1A NON-SMALL CELL LUNG CARCINOMA ---  04-10-2009   S/P RIGHT UPPER LOBECTOMY , NO CHEMORADIATION--  NO RECURRENCE  (ONCOLOGIST-- DR [facility])  . Hyperlipidemia   . Hypertension   . nscl ca dx'd 04/2009   lung  . Pulmonary nodule seen on imaging study    STABLE LEFT LOWER LOBE NODULE  PER CHEST CT--  MONITORED BY DR [facility]  . Sleep apnea    OSA does not use CPAP  . Type 2 diabetes mellitus ([place])   . Urgency of urination     Past Surgical History:  Procedure Laterality Date  . CARDIOVASCULAR STRESS TEST  04-08-2009   NORMAL NUCLEAR STUDY/ NO ISCHEMIA/ EF 60%  . CARPAL TUNNEL RELEASE Right   . COLONOSCOPY  last 2019  . CYST EXCISION Left 10/03/2018   Procedure: EXCISION  OF LEFT AXILLARY SEBACEOUS CYST;  Surgeon: Coralie Keens, MD;  Location: [place];  Service: General;  Laterality: Left;  . excision of abcess    . KNEE ARTHROSCOPY W/ MENISCECTOMY Left 10-31-2004  . LEFT SHOULDER ARTHROSCOPY/ ACROMIOPLASTY/ DECOMPRESSION/ SYNOVECTOMY  11-20-2007  . POLYPECTOMY    . TRANSURETHRAL RESECTION OF PROSTATE N/A 11/03/2013   Procedure: TRANSURETHRAL RESECTION OF THE PROSTATE WITH GYRUS INSTRUMENTS;  Surgeon: Franchot Gallo, MD;  Location: [facility];  Service: Urology;  Laterality: N/A;  . TRIGGER FINGER RELEASE Right 2019  . VIDEO ASSISTED THORACOSCOPY (VATS)/ LOBECTOMY Right 04-10-2009  DR Arlyce Dice   RIGHT UPPER LOBECTOMY AND NODE DISSECTION    Family History  Problem Relation Age of Onset  . Colitis Neg Hx   . Colon cancer Neg Hx   . Esophageal cancer Neg Hx   . Rectal cancer Neg Hx   . Stomach cancer Neg Hx     Social History Social History   Tobacco  Use  . Smoking status: Former Smoker    Years: 20.00    Types: Cigarettes    Quit date: 05/28/2000    Years since quitting: 20.4  . Smokeless tobacco: Never Used  Vaping Use  . Vaping Use: Never used  Substance Use Topics  . Alcohol use: Yes    Comment: social/once a month per pt  . Drug use: No    Current Outpatient Medications  Medication Sig Dispense Refill  . allopurinol (ZYLOPRIM) 100 MG tablet     . amLODipine-benazepril (LOTREL) 5-20 MG per capsule Take 1 capsule by mouth every morning.     Marland Kitchen aspirin EC 81 MG tablet Take by mouth.    . Aspirin-Salicylamide-Caffeine (BC HEADACHE POWDER PO) Take by mouth.    . cholecalciferol (VITAMIN D) 1000 UNITS tablet Take 1,000 Units by mouth daily.    . colchicine 0.6 MG tablet     . fluticasone (FLONASE) 50 MCG/ACT nasal spray SPRAY 1 SPRAY INTO EACH NOSTRIL EVERY DAY    . glyBURIDE (DIABETA) 5 MG tablet Take 5 mg by mouth daily.    . simvastatin (ZOCOR) 20 MG tablet 1 TABLET IN THE EVENING ONCE A DAY ORALLY 90 DAYS  1  . sitaGLIPtan-metformin (JANUMET) 50-500 MG per tablet Take 1 tablet by mouth 2 (two) times daily with a meal.     No current facility-administered medications for this visit.    No Known Allergies  Review of Systems  Constitutional: Negative for activity change, fatigue and unexpected weight change.  HENT: Negative for trouble swallowing and voice change.   Eyes: Negative for visual disturbance.  Respiratory: Positive for apnea (Does not use CPAP). Negative for cough, shortness of breath and wheezing.   Cardiovascular: Negative for chest pain and leg swelling.  Gastrointestinal: Negative for abdominal pain.  Genitourinary: Positive for frequency.  Musculoskeletal: Positive for arthralgias and joint swelling.  Neurological: Negative for seizures and weakness.  Hematological: Negative for adenopathy. Does not bruise/bleed easily.  All other systems reviewed and are negative.   BP 136/76   Pulse 78   Temp 97.6  F (36.4 C) (Skin)   Resp 20   Ht 5\' 11"  (1.803 m)   Wt 233 lb (105.7 kg)   SpO2 95% Comment: RA  BMI 32.50 kg/m   Diagnostic Tests: CT CHEST WITH CONTRAST  TECHNIQUE: Multidetector CT imaging of the chest was performed during intravenous contrast administration.  CONTRAST:  52mL OMNIPAQUE IOHEXOL 300 MG/ML  SOLN  COMPARISON:  04/29/2020 PET.  04/13/2020 chest CT.  FINDINGS: Cardiovascular: Aortic atherosclerosis. Normal heart size, without pericardial effusion. No central pulmonary embolism, on this non-dedicated study.  Mediastinum/Nodes: Mildly prominent right axillary nodes are favored to be reactive and not pathologic by size criteria. No mediastinal or hilar adenopathy.  Lungs/Pleura: No pleural fluid. Mild right-sided pleural thickening is unchanged.  Right upper lobectomy.  Similar volume loss adjacent to surgical sutures, including on 74/7.  Mild centrilobular emphysema.  Mixed attenuation vague left lower lobe 8 mm nodule on 88/7 is similar to on the prior exam (when remeasured).  Just cephalad to this, 2 adjacent ill-defined nodules on the order of 4-7 mm are unchanged.  Superior segment left lower lobe sub solid pulmonary nodule measures 2.1 x 2.2 cm on 66/7. Compare 2.3 x 2.3 cm on the prior exam (when remeasured).  Superior segment left lower lobe ground-glass pulmonary nodule measures 1.1 cm on 27/7 and is similar to on the prior exam (when remeasured).  Upper Abdomen: Possible mild steatosis. Normal imaged portions of the spleen, stomach, pancreas, right adrenal gland, kidneys. Mild left adrenal thickening.  Musculoskeletal: No acute osseous abnormality. Lower thoracic and lower cervical spondylosis.  IMPRESSION: 1. Status post right upper lobectomy, without recurrent or metastatic disease. 2. Left lower lobe ground-glass nodules, including 2 dominant unchanged nodules as detailed above. These remain suspicious  for indolent primary bronchogenic carcinoma, likely low-grade adenocarcinoma. 3.  Aortic Atherosclerosis (ICD10-I70.0).   Electronically Signed   By: Abigail Miyamoto M.D.   On: 11/01/2020 13:52 I personally reviewed the CT images and compared them to multiple prior scans from the last 8 years.  I concur with the findings noted above.  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  Left lower lobe lung nodules-he has 2 primarily groundglass nodules in the left lower lobe.  There is a larger more central nodule and then a smaller more peripheral nodule in the superior segment.  These of both been present for several years with a central nodule dating back at least to 2013.  There has been slow progression over time although not much in the way of change over the past 6 months.  We discussed the differential diagnosis of those nodules.  Given his history these are highly suspicious for low-grade adenocarcinomas.  Infectious and inflammatory nodules are also in the differential but are far less likely.  We did discuss that these nodules have been growing very slowly with no signs of any aggressive activity.  He understands that that could change, but they also could remain indolent indefinitely.  Potential treatment options would include surgical resection and stereotactic radiation.  He has had a right upper lobectomy so we would need to check pulmonary function testing to see if he would have adequate pulmonary reserve to tolerate resection.  Resection would require lobectomy due to the central nature of the larger lesion.  I recommended that we do a navigational bronchoscopy to sample both lesions.  I informed her of the general nature of the procedure.  He understands that it  is an endoscopic procedure that we would plan to do in the operating room under general anesthesia.  We will plan to do the procedure on an outpatient basis.  I informed him of the indications, risks, benefits, alternatives, and limitations.  He understands there is no guarantee of a definitive  diagnosis.  He understands the risks include those associated with general anesthesia.  He understands the risks include, but not limited to MI, stroke, DVT, PE, bleeding, pneumothorax, as well as possibility of other unforeseeable complications.  He accepts the risks and wishes to proceed.  Hypertension-blood pressure well controlled on current regimen.  Hyperlipidemia-on simvastatin.  Type 2 diabetes-on glyburide and Janumet.  Plan: Pulmonary function testing with and without bronchodilators Electromagnetic navigational bronchoscopy for biopsy on Thursday, 12/02/2020  Melrose Nakayama, MD [facility] [phone]

## 2020-11-29 NOTE — Pre-Procedure Instructions (Signed)
[id number] [address], [place] [address] Phone: [phone] Fax: [phone]      Your procedure is scheduled on Thursday, December 30th at 8:00a.m.  Report to [facility] Main Entrance "A" at 6:00 A.M., and check in at the Admitting office.  Call this number if you have problems the morning of surgery:  [phone]  Call [phone] if you have any questions prior to your surgery date Monday-Friday 8am-4pm    Remember:  Do not eat or drink after midnight the night before your surgery.    Take these medicines the morning of surgery with A SIP OF WATER  allopurinol (ZYLOPRIM)  Colchicine fluticasone (FLONASE) simvastatin (ZOCOR)  As of today, STOP taking any Aleve, Naproxen, Ibuprofen, Motrin, Advil, Goody's, BC's, all herbal medications, fish oil, and all vitamins.  Follow your surgeon's instructions on when to stop Aspirin.  If no instructions were given by your surgeon then you will need to call the office to get those instructions.     WHAT DO I DO ABOUT MY DIABETES MEDICATION?   Marland Kitchen Do not take glyBURIDE (DIABETA) or sitaGLIPtan-metformin (JANUMET).   HOW TO MANAGE YOUR DIABETES BEFORE AND AFTER SURGERY  Why is it important to control my blood sugar before and after surgery? . Improving blood sugar levels before and after surgery helps healing and can limit problems. . A way of improving blood sugar control is eating a healthy diet by: o  Eating less sugar and carbohydrates o  Increasing activity/exercise o  Talking with your doctor about reaching your blood sugar goals . High blood sugars (greater than 180 mg/dL) can raise your risk of infections and slow your recovery, so you will need to focus on controlling your diabetes during the weeks before surgery. . Make sure that the doctor who takes care of your diabetes knows about your planned surgery including the date and location.  How do I manage my blood  sugar before surgery? . Check your blood sugar at least 4 times a day, starting 2 days before surgery, to make sure that the level is not too high or low. . Check your blood sugar the morning of your surgery when you wake up and every 2 hours until you get to the Short Stay unit. o If your blood sugar is less than 70 mg/dL, you will need to treat for low blood sugar: - Do not take insulin. - Treat a low blood sugar (less than 70 mg/dL) with  cup of clear juice (cranberry or apple), 4 glucose tablets, OR glucose gel. - Recheck blood sugar in 15 minutes after treatment (to make sure it is greater than 70 mg/dL). If your blood sugar is not greater than 70 mg/dL on recheck, call [phone] for further instructions. . Report your blood sugar to the short stay nurse when you get to Short Stay.  . If you are admitted to the hospital after surgery: o Your blood sugar will be checked by the staff and you will probably be given insulin after surgery (instead of oral diabetes medicines) to make sure you have good blood sugar levels. o The goal for blood sugar control after surgery is 80-180 mg/dL.                      Do not wear jewelry.            Do not wear lotions, powders, colognes, or deodorant.  Men may shave face and neck.            Do not bring valuables to the hospital.            [facility] is not responsible for any belongings or valuables.  Do NOT Smoke (Tobacco/Vaping) or drink Alcohol 24 hours prior to your procedure If you use a CPAP at night, you may bring all equipment for your overnight stay.   Contacts, glasses, dentures or bridgework may not be worn into surgery.      For patients admitted to the hospital, discharge time will be determined by your treatment team.   Patients discharged the day of surgery will not be allowed to drive home, and someone needs to stay with them for 24 hours.    Special instructions:   [facility]- Preparing For Surgery  Before  surgery, you can play an important role. Because skin is not sterile, your skin needs to be as free of germs as possible. You can reduce the number of germs on your skin by washing with CHG (chlorahexidine gluconate) Soap before surgery.  CHG is an antiseptic cleaner which kills germs and bonds with the skin to continue killing germs even after washing.    Oral Hygiene is also important to reduce your risk of infection.  Remember - BRUSH YOUR TEETH THE MORNING OF SURGERY WITH YOUR REGULAR TOOTHPASTE  Please do not use if you have an allergy to CHG or antibacterial soaps. If your skin becomes reddened/irritated stop using the CHG.  Do not shave (including legs and underarms) for at least 48 hours prior to first CHG shower. It is OK to shave your face.  Please follow these instructions carefully.   1. Shower the NIGHT BEFORE SURGERY and the MORNING OF SURGERY with CHG Soap.   2. If you chose to wash your hair, wash your hair first as usual with your normal shampoo.  3. After you shampoo, rinse your hair and body thoroughly to remove the shampoo.  4. Use CHG as you would any other liquid soap. You can apply CHG directly to the skin and wash gently with a scrungie or a clean washcloth.   5. Apply the CHG Soap to your body ONLY FROM THE NECK DOWN.  Do not use on open wounds or open sores. Avoid contact with your eyes, ears, mouth and genitals (private parts). Wash Face and genitals (private parts)  with your normal soap.   6. Wash thoroughly, paying special attention to the area where your surgery will be performed.  7. Thoroughly rinse your body with warm water from the neck down.  8. DO NOT shower/wash with your normal soap after using and rinsing off the CHG Soap.  9. Pat yourself dry with a CLEAN TOWEL.  10. Wear CLEAN PAJAMAS to bed the night before surgery  11. Place CLEAN SHEETS on your bed the night of your first shower and DO NOT SLEEP WITH PETS.   Day of Surgery: Wear  Clean/Comfortable clothing the morning of surgery Do not apply any deodorants/lotions.   Remember to brush your teeth WITH YOUR REGULAR TOOTHPASTE.   Please read over the following fact sheets that you were given.

## 2020-11-30 ENCOUNTER — Ambulatory Visit (HOSPITAL_COMMUNITY)
Admission: RE | Admit: 2020-11-30 | Discharge: 2020-11-30 | Disposition: A | Discharge status: Home / Self Care | Payer: Medicare PPO | Source: Ambulatory Visit | Attending: Thoracic Surgery (Cardiothoracic Vascular Surgery) | Admitting: Thoracic Surgery (Cardiothoracic Vascular Surgery)

## 2020-11-30 ENCOUNTER — Encounter (HOSPITAL_COMMUNITY)
Admission: RE | Admit: 2020-11-30 | Discharge: 2020-11-30 | Disposition: A | Discharge status: Home / Self Care | Payer: Medicare PPO | Source: Ambulatory Visit | Attending: Thoracic Surgery (Cardiothoracic Vascular Surgery) | Admitting: Thoracic Surgery (Cardiothoracic Vascular Surgery)

## 2020-11-30 ENCOUNTER — Other Ambulatory Visit: Payer: Self-pay

## 2020-11-30 ENCOUNTER — Encounter (HOSPITAL_COMMUNITY): Payer: Self-pay

## 2020-11-30 ENCOUNTER — Other Ambulatory Visit (HOSPITAL_COMMUNITY)
Admission: RE | Admit: 2020-11-30 | Discharge: 2020-11-30 | Disposition: A | Discharge status: Home / Self Care | Payer: Medicare PPO | Source: Ambulatory Visit | Attending: Thoracic Surgery (Cardiothoracic Vascular Surgery) | Admitting: Thoracic Surgery (Cardiothoracic Vascular Surgery)

## 2020-11-30 DIAGNOSIS — Z20822 Contact with and (suspected) exposure to covid-19: Secondary | ICD-10-CM | POA: Insufficient documentation

## 2020-11-30 DIAGNOSIS — R911 Solitary pulmonary nodule: Secondary | ICD-10-CM | POA: Insufficient documentation

## 2020-11-30 DIAGNOSIS — R918 Other nonspecific abnormal finding of lung field: Secondary | ICD-10-CM | POA: Diagnosis not present

## 2020-11-30 DIAGNOSIS — J90 Pleural effusion, not elsewhere classified: Secondary | ICD-10-CM | POA: Diagnosis not present

## 2020-11-30 DIAGNOSIS — E119 Type 2 diabetes mellitus without complications: Secondary | ICD-10-CM | POA: Diagnosis not present

## 2020-11-30 DIAGNOSIS — Z01818 Encounter for other preprocedural examination: Secondary | ICD-10-CM | POA: Insufficient documentation

## 2020-11-30 DIAGNOSIS — I10 Essential (primary) hypertension: Secondary | ICD-10-CM | POA: Diagnosis not present

## 2020-11-30 DIAGNOSIS — Z87891 Personal history of nicotine dependence: Secondary | ICD-10-CM | POA: Insufficient documentation

## 2020-11-30 DIAGNOSIS — M479 Spondylosis, unspecified: Secondary | ICD-10-CM | POA: Insufficient documentation

## 2020-11-30 HISTORY — DX: Unspecified osteoarthritis, unspecified site: M19.90

## 2020-11-30 LAB — CBC
HCT: 43.8 % (ref 39.0–52.0)
Hemoglobin: 14.2 g/dL (ref 13.0–17.0)
MCH: 27.1 pg (ref 26.0–34.0)
MCHC: 32.4 g/dL (ref 30.0–36.0)
MCV: 83.6 fL (ref 80.0–100.0)
Platelets: 245 10*3/uL (ref 150–400)
RBC: 5.24 MIL/uL (ref 4.22–5.81)
RDW: 15.2 % (ref 11.5–15.5)
WBC: 7.8 10*3/uL (ref 4.0–10.5)
nRBC: 0 % (ref 0.0–0.2)

## 2020-11-30 LAB — COMPREHENSIVE METABOLIC PANEL
ALT: 50 U/L — ABNORMAL HIGH (ref 0–44)
AST: 42 U/L — ABNORMAL HIGH (ref 15–41)
Albumin: 3.9 g/dL (ref 3.5–5.0)
Alkaline Phosphatase: 55 U/L (ref 38–126)
Anion gap: 10 (ref 5–15)
BUN: 17 mg/dL (ref 8–23)
CO2: 21 mmol/L — ABNORMAL LOW (ref 22–32)
Calcium: 9.3 mg/dL (ref 8.9–10.3)
Chloride: 111 mmol/L (ref 98–111)
Creatinine, Ser: 0.96 mg/dL (ref 0.61–1.24)
GFR, Estimated: >60 mL/min (ref >60)
Glucose, Bld: 108 mg/dL — ABNORMAL HIGH (ref 70–99)
Potassium: 3.9 mmol/L (ref 3.5–5.1)
Sodium: 142 mmol/L (ref 135–145)
Total Bilirubin: <0.1 mg/dL — ABNORMAL LOW (ref 0.3–1.2)
Total Protein: 6.9 g/dL (ref 6.5–8.1)

## 2020-11-30 LAB — HEMOGLOBIN A1C
Hgb A1c MFr Bld: 6.2 % — ABNORMAL HIGH (ref 4.8–5.6)
Mean Plasma Glucose: 131.24 mg/dL

## 2020-11-30 LAB — SURGICAL PCR SCREEN
MRSA, PCR: NEGATIVE
Staphylococcus aureus: NEGATIVE

## 2020-11-30 LAB — PROTIME-INR
INR: 1 (ref 0.8–1.2)
Prothrombin Time: 12.8 seconds (ref 11.4–15.2)

## 2020-11-30 LAB — APTT: aPTT: 29 seconds (ref 24–36)

## 2020-11-30 LAB — GLUCOSE, CAPILLARY: Glucose-Capillary: 117 mg/dL — ABNORMAL HIGH (ref 70–99)

## 2020-11-30 LAB — SARS CORONAVIRUS 2 (TAT 6-24 HRS): SARS Coronavirus 2: NEGATIVE

## 2020-11-30 NOTE — Progress Notes (Signed)
PCP - Dr. Delfina Redwood  Cardiologist - Denies  Chest x-ray - 11/30/20  EKG - 11/30/20  Stress Test - 2010  ECHO - Denies  Cardiac Cath - Denies  AICD-na PM-na LOOP-na  Sleep Study - Yes- Positive CPAP - Denies  LABS- 11/30/20: CBC, CMP, PT, PTT, PCR, COVID  ASA- LD- 12/27  ERAS- No  HA1C- 11/30/20 Fasting Blood Sugar - 95-117, today, 117 Checks Blood Sugar __3___ times a week  Anesthesia- No  Pt denies having chest pain, sob, or fever at this time. All instructions explained to the pt, with a verbal understanding of the material. Pt agrees to go over the instructions while at home for a better understanding. Pt also instructed to self quarantine after being tested for COVID-19. The opportunity to ask questions was provided.   Coronavirus Screening  Have you experienced the following symptoms:  Cough yes/no: No Fever (>100.67F)  yes/no: No Runny nose yes/no: No Sore throat yes/no: No Difficulty breathing/shortness of breath  yes/no: No  Have you or a family member traveled in the last 14 days and where? yes/no: No   If the patient indicates "YES" to the above questions, their PAT will be rescheduled to limit the exposure to others and, the surgeon will be notified. THE PATIENT WILL NEED TO BE ASYMPTOMATIC FOR 14 DAYS.   If the patient is not experiencing any of these symptoms, the PAT nurse will instruct them to NOT bring anyone with them to their appointment since they may have these symptoms or traveled as well.   Please remind your patients and families that hospital visitation restrictions are in effect and the importance of the restrictions.

## 2020-12-01 ENCOUNTER — Ambulatory Visit (HOSPITAL_COMMUNITY)
Admission: RE | Admit: 2020-12-01 | Discharge: 2020-12-01 | Disposition: A | Discharge status: Home / Self Care | Payer: Medicare PPO | Source: Ambulatory Visit | Attending: Thoracic Surgery (Cardiothoracic Vascular Surgery) | Admitting: Thoracic Surgery (Cardiothoracic Vascular Surgery)

## 2020-12-01 DIAGNOSIS — Z87891 Personal history of nicotine dependence: Secondary | ICD-10-CM | POA: Diagnosis not present

## 2020-12-01 DIAGNOSIS — J449 Chronic obstructive pulmonary disease, unspecified: Secondary | ICD-10-CM | POA: Insufficient documentation

## 2020-12-01 DIAGNOSIS — R911 Solitary pulmonary nodule: Secondary | ICD-10-CM | POA: Diagnosis not present

## 2020-12-01 LAB — PULMONARY FUNCTION TEST
DL/VA % pred: 90 %
DL/VA: 3.61 ml/min/mmHg/L
DLCO cor % pred: 73 %
DLCO cor: 18.64 ml/min/mmHg
DLCO unc % pred: 72 %
DLCO unc: 18.42 ml/min/mmHg
FEF 25-75 Post: 1.91 L/sec
FEF 25-75 Pre: 1.66 L/sec
FEF2575-%Change-Post: 15 %
FEF2575-%Pred-Post: 82 %
FEF2575-%Pred-Pre: 71 %
FEV1-%Change-Post: 3 %
FEV1-%Pred-Post: 95 %
FEV1-%Pred-Pre: 92 %
FEV1-Post: 2.69 L
FEV1-Pre: 2.6 L
FEV1FVC-%Change-Post: 0 %
FEV1FVC-%Pred-Pre: 91 %
FEV6-%Change-Post: 1 %
FEV6-%Pred-Post: 104 %
FEV6-%Pred-Pre: 102 %
FEV6-Post: 3.74 L
FEV6-Pre: 3.67 L
FEV6FVC-%Change-Post: -2 %
FEV6FVC-%Pred-Post: 100 %
FEV6FVC-%Pred-Pre: 103 %
FVC-%Change-Post: 4 %
FVC-%Pred-Post: 103 %
FVC-%Pred-Pre: 99 %
FVC-Post: 3.9 L
FVC-Pre: 3.74 L
Post FEV1/FVC ratio: 69 %
Post FEV6/FVC ratio: 96 %
Pre FEV1/FVC ratio: 70 %
Pre FEV6/FVC Ratio: 98 %
RV % pred: 94 %
RV: 2.39 L
TLC % pred: 88 %
TLC: 6.22 L

## 2020-12-01 MED ORDER — ALBUTEROL SULFATE (2.5 MG/3ML) 0.083% IN NEBU
2.5000 mg | INHALATION_SOLUTION | Freq: Once | RESPIRATORY_TRACT | Status: AC
  Administered 2020-12-01: 2.5 mg via RESPIRATORY_TRACT

## 2020-12-02 ENCOUNTER — Ambulatory Visit (HOSPITAL_COMMUNITY): Payer: Medicare PPO

## 2020-12-02 ENCOUNTER — Other Ambulatory Visit: Payer: Self-pay

## 2020-12-02 ENCOUNTER — Ambulatory Visit (HOSPITAL_COMMUNITY): Payer: Medicare PPO | Admitting: Certified Registered"

## 2020-12-02 ENCOUNTER — Encounter (HOSPITAL_COMMUNITY): Payer: Self-pay | Admitting: Thoracic Surgery (Cardiothoracic Vascular Surgery)

## 2020-12-02 ENCOUNTER — Encounter (HOSPITAL_COMMUNITY)
Admission: RE | Disposition: A | Discharge status: Home / Self Care | Payer: Self-pay | Source: Home / Self Care | Attending: Thoracic Surgery (Cardiothoracic Vascular Surgery)

## 2020-12-02 ENCOUNTER — Ambulatory Visit (HOSPITAL_COMMUNITY)
Admission: RE | Admit: 2020-12-02 | Discharge: 2020-12-02 | Disposition: A | Discharge status: Home / Self Care | Payer: Medicare PPO | Attending: Thoracic Surgery (Cardiothoracic Vascular Surgery) | Admitting: Thoracic Surgery (Cardiothoracic Vascular Surgery)

## 2020-12-02 DIAGNOSIS — Z79899 Other long term (current) drug therapy: Secondary | ICD-10-CM | POA: Diagnosis not present

## 2020-12-02 DIAGNOSIS — G4733 Obstructive sleep apnea (adult) (pediatric): Secondary | ICD-10-CM | POA: Insufficient documentation

## 2020-12-02 DIAGNOSIS — E119 Type 2 diabetes mellitus without complications: Secondary | ICD-10-CM | POA: Diagnosis not present

## 2020-12-02 DIAGNOSIS — C3411 Malignant neoplasm of upper lobe, right bronchus or lung: Secondary | ICD-10-CM | POA: Diagnosis not present

## 2020-12-02 DIAGNOSIS — Z419 Encounter for procedure for purposes other than remedying health state, unspecified: Secondary | ICD-10-CM

## 2020-12-02 DIAGNOSIS — Z8551 Personal history of malignant neoplasm of bladder: Secondary | ICD-10-CM | POA: Insufficient documentation

## 2020-12-02 DIAGNOSIS — R918 Other nonspecific abnormal finding of lung field: Secondary | ICD-10-CM | POA: Diagnosis present

## 2020-12-02 DIAGNOSIS — I10 Essential (primary) hypertension: Secondary | ICD-10-CM | POA: Insufficient documentation

## 2020-12-02 DIAGNOSIS — R911 Solitary pulmonary nodule: Secondary | ICD-10-CM

## 2020-12-02 DIAGNOSIS — Z7982 Long term (current) use of aspirin: Secondary | ICD-10-CM | POA: Insufficient documentation

## 2020-12-02 DIAGNOSIS — N400 Enlarged prostate without lower urinary tract symptoms: Secondary | ICD-10-CM | POA: Diagnosis not present

## 2020-12-02 DIAGNOSIS — J449 Chronic obstructive pulmonary disease, unspecified: Secondary | ICD-10-CM | POA: Insufficient documentation

## 2020-12-02 DIAGNOSIS — Z7984 Long term (current) use of oral hypoglycemic drugs: Secondary | ICD-10-CM | POA: Diagnosis not present

## 2020-12-02 DIAGNOSIS — Z87891 Personal history of nicotine dependence: Secondary | ICD-10-CM | POA: Insufficient documentation

## 2020-12-02 DIAGNOSIS — C3432 Malignant neoplasm of lower lobe, left bronchus or lung: Secondary | ICD-10-CM | POA: Insufficient documentation

## 2020-12-02 DIAGNOSIS — E785 Hyperlipidemia, unspecified: Secondary | ICD-10-CM | POA: Insufficient documentation

## 2020-12-02 DIAGNOSIS — Z902 Acquired absence of lung [part of]: Secondary | ICD-10-CM | POA: Insufficient documentation

## 2020-12-02 DIAGNOSIS — Z85118 Personal history of other malignant neoplasm of bronchus and lung: Secondary | ICD-10-CM | POA: Insufficient documentation

## 2020-12-02 HISTORY — PX: VIDEO BRONCHOSCOPY WITH ENDOBRONCHIAL NAVIGATION: SHX6175

## 2020-12-02 LAB — GLUCOSE, CAPILLARY
Glucose-Capillary: 112 mg/dL — ABNORMAL HIGH (ref 70–99)
Glucose-Capillary: 117 mg/dL — ABNORMAL HIGH (ref 70–99)

## 2020-12-02 SURGERY — VIDEO BRONCHOSCOPY WITH ENDOBRONCHIAL NAVIGATION
Anesthesia: General

## 2020-12-02 MED ORDER — SUGAMMADEX SODIUM 200 MG/2ML IV SOLN
INTRAVENOUS | Status: DC | PRN
  Administered 2020-12-02: 250 mg via INTRAVENOUS

## 2020-12-02 MED ORDER — PROPOFOL 10 MG/ML IV BOLUS
INTRAVENOUS | Status: AC
  Filled 2020-12-02: qty 40

## 2020-12-02 MED ORDER — EPINEPHRINE PF 1 MG/ML IJ SOLN
INTRAMUSCULAR | Status: DC | PRN
  Administered 2020-12-02: 1 mg

## 2020-12-02 MED ORDER — LACTATED RINGERS IV SOLN: INTRAVENOUS | Status: DC

## 2020-12-02 MED ORDER — PROPOFOL 500 MG/50ML IV EMUL
INTRAVENOUS | Status: AC
  Filled 2020-12-02: qty 50

## 2020-12-02 MED ORDER — ROCURONIUM BROMIDE 10 MG/ML (PF) SYRINGE
PREFILLED_SYRINGE | INTRAVENOUS | Status: DC | PRN
  Administered 2020-12-02: 10 mg via INTRAVENOUS
  Administered 2020-12-02: 50 mg via INTRAVENOUS

## 2020-12-02 MED ORDER — DEXAMETHASONE SODIUM PHOSPHATE 10 MG/ML IJ SOLN
INTRAMUSCULAR | Status: DC | PRN
  Administered 2020-12-02: 5 mg via INTRAVENOUS

## 2020-12-02 MED ORDER — LIDOCAINE 2% (20 MG/ML) 5 ML SYRINGE
INTRAMUSCULAR | Status: DC | PRN
  Administered 2020-12-02: 100 mg via INTRAVENOUS

## 2020-12-02 MED ORDER — EPINEPHRINE PF 1 MG/ML IJ SOLN
INTRAMUSCULAR | Status: AC
  Filled 2020-12-02: qty 1

## 2020-12-02 MED ORDER — ONDANSETRON HCL 4 MG/2ML IJ SOLN
INTRAMUSCULAR | Status: DC | PRN
  Administered 2020-12-02: 4 mg via INTRAVENOUS

## 2020-12-02 MED ORDER — FENTANYL CITRATE (PF) 100 MCG/2ML IJ SOLN
INTRAMUSCULAR | Status: AC
  Filled 2020-12-02: qty 2

## 2020-12-02 MED ORDER — FENTANYL CITRATE (PF) 100 MCG/2ML IJ SOLN: 25.0000 ug | INTRAMUSCULAR | Status: DC | PRN

## 2020-12-02 MED ORDER — ORAL CARE MOUTH RINSE: 15.0000 mL | Freq: Once | OROMUCOSAL | Status: AC

## 2020-12-02 MED ORDER — ONDANSETRON HCL 4 MG/2ML IJ SOLN: 4.0000 mg | Freq: Once | INTRAMUSCULAR | Status: DC | PRN

## 2020-12-02 MED ORDER — PROPOFOL 10 MG/ML IV BOLUS
INTRAVENOUS | Status: DC | PRN
  Administered 2020-12-02: 30 mg via INTRAVENOUS
  Administered 2020-12-02: 150 mg via INTRAVENOUS

## 2020-12-02 MED ORDER — CHLORHEXIDINE GLUCONATE 0.12 % MT SOLN
15.0000 mL | Freq: Once | OROMUCOSAL | Status: AC
  Administered 2020-12-02: 15 mL via OROMUCOSAL
  Filled 2020-12-02: qty 15

## 2020-12-02 MED ORDER — FENTANYL CITRATE (PF) 100 MCG/2ML IJ SOLN
INTRAMUSCULAR | Status: DC | PRN
  Administered 2020-12-02 (×2): 50 ug via INTRAVENOUS

## 2020-12-02 SURGICAL SUPPLY — 42 items
ADAPTER BRONCHOSCOPE OLYMPUS (ADAPTER) ×2 IMPLANT
ADAPTER VALVE BIOPSY EBUS (MISCELLANEOUS) IMPLANT
ADPR BSCP OLMPS EDG (ADAPTER) ×1
ADPTR VALVE BIOPSY EBUS (MISCELLANEOUS)
BLADE CLIPPER SURG (BLADE) ×1 IMPLANT
BRUSH BIOPSY BRONCH 10 SDTNB (MISCELLANEOUS) ×1 IMPLANT
BRUSH SUPERTRAX BIOPSY (INSTRUMENTS) IMPLANT
BRUSH SUPERTRAX NDL-TIP CYTO (INSTRUMENTS) ×3 IMPLANT
CANISTER SUCT 3000ML PPV (MISCELLANEOUS) ×2 IMPLANT
CNTNR URN SCR LID CUP LEK RST (MISCELLANEOUS) ×2 IMPLANT
CONT SPEC 4OZ STRL OR WHT (MISCELLANEOUS) ×6
COVER BACK TABLE 60X90IN (DRAPES) ×2 IMPLANT
FILTER STRAW FLUID ASPIR (MISCELLANEOUS) ×1 IMPLANT
FORCEPS BIOP SUPERTRX PREMAR (INSTRUMENTS) ×1 IMPLANT
GAUZE SPONGE 4X4 12PLY STRL (GAUZE/BANDAGES/DRESSINGS) ×2 IMPLANT
GLOVE SURG SIGNA 7.5 PF LTX (GLOVE) ×2 IMPLANT
GOWN STRL REUS W/ TWL XL LVL3 (GOWN DISPOSABLE) ×1 IMPLANT
GOWN STRL REUS W/TWL XL LVL3 (GOWN DISPOSABLE) ×2
KIT CLEAN ENDO COMPLIANCE (KITS) ×2 IMPLANT
KIT ILLUMISITE 180 PROCEDURE (KITS) ×1 IMPLANT
KIT ILLUMISITE 90 PROCEDURE (KITS) ×1 IMPLANT
KIT TURNOVER KIT B (KITS) ×2 IMPLANT
MARKER SKIN DUAL TIP RULER LAB (MISCELLANEOUS) ×2 IMPLANT
NDL SUPERTRX PREMARK BIOPSY (NEEDLE) IMPLANT
NEEDLE SUPERTRX PREMARK BIOPSY (NEEDLE) ×2 IMPLANT
NS IRRIG 1000ML POUR BTL (IV SOLUTION) ×2 IMPLANT
OIL SILICONE PENTAX (PARTS (SERVICE/REPAIRS)) ×2 IMPLANT
PAD ARMBOARD 7.5X6 YLW CONV (MISCELLANEOUS) ×4 IMPLANT
PATCHES PATIENT (LABEL) ×6 IMPLANT
SYR 20ML ECCENTRIC (SYRINGE) ×3 IMPLANT
SYR 20ML LL LF (SYRINGE) ×2 IMPLANT
SYR 30ML LL (SYRINGE) ×2 IMPLANT
SYR 5ML LL (SYRINGE) ×2 IMPLANT
TOWEL GREEN STERILE (TOWEL DISPOSABLE) ×2 IMPLANT
TOWEL GREEN STERILE FF (TOWEL DISPOSABLE) ×2 IMPLANT
TRAP SPECIMEN MUCUS 40CC (MISCELLANEOUS) ×2 IMPLANT
TUBE CONNECTING 20X1/4 (TUBING) ×4 IMPLANT
UNDERPAD 30X36 HEAVY ABSORB (UNDERPADS AND DIAPERS) ×2 IMPLANT
VALVE BIOPSY  SINGLE USE (MISCELLANEOUS) ×2
VALVE BIOPSY SINGLE USE (MISCELLANEOUS) ×1 IMPLANT
VALVE SUCTION BRONCHIO DISP (MISCELLANEOUS) ×2 IMPLANT
WATER STERILE IRR 1000ML POUR (IV SOLUTION) ×2 IMPLANT

## 2020-12-02 NOTE — Anesthesia Postprocedure Evaluation (Signed)
Anesthesia Post Note  Patient: Bobby [name]  Procedure(s) Performed: VIDEO BRONCHOSCOPY WITH ENDOBRONCHIAL NAVIGATION (N/A )     Patient location during evaluation: PACU Anesthesia Type: General Level of consciousness: sedated and patient cooperative Pain management: pain level controlled Vital Signs Assessment: post-procedure vital signs reviewed and stable Respiratory status: spontaneous breathing Cardiovascular status: stable Anesthetic complications: no   No complications documented.  Last Vitals:  Vitals:   12/02/20 1430 12/02/20 1441  BP: 124/66 127/63  Pulse: 66 65  Resp: 20 18  Temp: (!) 36.4 C   SpO2: 95% 93%    Last Pain:  Vitals:   12/02/20 1430  TempSrc:   PainSc: 0-No pain                 Nolon Nations

## 2020-12-02 NOTE — Transfer of Care (Signed)
Immediate Anesthesia Transfer of Care Note  Patient: Bobby [name]  Procedure(s) Performed: VIDEO BRONCHOSCOPY WITH ENDOBRONCHIAL NAVIGATION (N/A )  Patient Location: PACU  Anesthesia Type:General  Level of Consciousness: awake, alert , oriented and patient cooperative  Airway & Oxygen Therapy: Patient Spontanous Breathing and Patient connected to face mask oxygen  Post-op Assessment: Report given to RN and Post -op Vital signs reviewed and stable  Post vital signs: Reviewed and stable  Last Vitals:  Vitals Value Taken Time  BP 147/67 12/02/20 1354  Temp 36.9 C 12/02/20 1354  Pulse 73 12/02/20 1356  Resp 20 12/02/20 1356  SpO2 98 % 12/02/20 1356  Vitals shown include unvalidated device data.  Last Pain:  Vitals:   12/02/20 1143  TempSrc:   PainSc: 0-No pain         Complications: No complications documented.

## 2020-12-02 NOTE — Brief Op Note (Signed)
12/02/2020  1:56 PM  PATIENT:  Bobby [name]  74 y.o. male  PRE-OPERATIVE DIAGNOSIS:  left lower lobe LUNG NODULES  POST-OPERATIVE DIAGNOSIS:  left lower lobe LUNG NODULES  PROCEDURE:  Procedure(s): VIDEO BRONCHOSCOPY WITH ENDOBRONCHIAL NAVIGATION (N/A) Needle aspirations, brushings and transbronchial biopsies  SURGEON:  Surgeon(s) and Role:    Melrose Nakayama, MD - Primary  PHYSICIAN ASSISTANT:   ASSISTANTS: none   ANESTHESIA:   general  EBL: minimal  BLOOD ADMINISTERED:none  DRAINS: none   LOCAL MEDICATIONS USED:  NONE  SPECIMEN:  Source of Specimen:  LLL central and superior nodules  DISPOSITION OF SPECIMEN:  PATHOLOGY  COUNTS:  NO endoscopic  TOURNIQUET:  * No tourniquets in log *  DICTATION: .Other Dictation: Dictation Number -  PLAN OF CARE: Discharge to home after PACU  PATIENT DISPOSITION:  PACU - hemodynamically stable.   Delay start of Pharmacological VTE agent (>24hrs) due to surgical blood loss or risk of bleeding: not applicable

## 2020-12-02 NOTE — Anesthesia Preprocedure Evaluation (Addendum)
Anesthesia Evaluation  Patient identified by MRN, date of birth, ID band Patient awake    Reviewed: Allergy & Precautions, NPO status , Patient's Chart, lab work & pertinent test results  Airway Mallampati: II  TM Distance: >3 FB Neck ROM: Full    Dental  (+) Poor Dentition, Chipped, Partial Upper, Partial Lower, Missing, Dental Advisory Given   Pulmonary sleep apnea , COPD, former smoker,    breath sounds clear to auscultation + decreased breath sounds      Cardiovascular hypertension, Pt. on medications  Rhythm:Regular Rate:Normal     Neuro/Psych negative neurological ROS     GI/Hepatic negative GI ROS, Neg liver ROS,   Endo/Other  diabetes  Renal/GU negative Renal ROS     Musculoskeletal  (+) Arthritis ,   Abdominal (+) + obese,   Peds  Hematology negative hematology ROS (+)   Anesthesia Other Findings   Reproductive/Obstetrics                            Anesthesia Physical Anesthesia Plan  ASA: III  Anesthesia Plan: General   Post-op Pain Management:    Induction: Intravenous  PONV Risk Score and Plan: 2 and Ondansetron, Dexamethasone and Treatment may vary due to age or medical condition  Airway Management Planned: Oral ETT  Additional Equipment: None  Intra-op Plan:   Post-operative Plan: Extubation in OR  Informed Consent: I have reviewed the patients History and Physical, chart, labs and discussed the procedure including the risks, benefits and alternatives for the proposed anesthesia with the patient or authorized representative who has indicated his/her understanding and acceptance.     Dental advisory given  Plan Discussed with: CRNA  Anesthesia Plan Comments:        Anesthesia Quick Evaluation

## 2020-12-02 NOTE — Anesthesia Procedure Notes (Signed)
Procedure Name: Intubation Date/Time: 12/02/2020 12:39 PM Performed by: Cleda Daub, CRNA Pre-anesthesia Checklist: Patient identified, Emergency Drugs available, Suction available and Patient being monitored Patient Re-evaluated:Patient Re-evaluated prior to induction Oxygen Delivery Method: Circle system utilized Preoxygenation: Pre-oxygenation with 100% oxygen Induction Type: IV induction Ventilation: Mask ventilation without difficulty and Oral airway inserted - appropriate to patient size Laryngoscope Size: Mac and 3 Grade View: Grade I Tube type: Oral Tube size: 8.5 mm Number of attempts: 1 Airway Equipment and Method: Stylet and Oral airway Placement Confirmation: ETT inserted through vocal cords under direct vision,  positive ETCO2 and breath sounds checked- equal and bilateral Secured at: 22 (confirmed via brochoscope by surgeon) cm Tube secured with: Tape Dental Injury: Teeth and Oropharynx as per pre-operative assessment

## 2020-12-02 NOTE — Interval H&P Note (Signed)
History and Physical Interval Note: Planning complete   12/02/2020 12:14 PM  Bobby [name]  has presented today for surgery, with the diagnosis of left lower lobe LUNG NODULES.  The various methods of treatment have been discussed with the patient and family. After consideration of risks, benefits and other options for treatment, the patient has consented to  Procedure(s): [place] (N/A) as a surgical intervention.  [name] history has been reviewed, patient examined, no change in status, stable for surgery.  I have reviewed the patient's chart and labs.  Questions were answered to the patient's satisfaction.     Melrose Nakayama

## 2020-12-02 NOTE — Discharge Instructions (Addendum)
Do not drive or engage in heavy physical activity for 24 hours.  You may resume normal activities tomorrow.  You may cough up small amounts of blood over the next few days.   Call [phone] if you develop chest pain, shortness of breath, or cough up more than 2 tablespoons of blood.  You may use acetaminophen (Tylenol) if needed for discomfort. You may use an over the counter cough medication if needed.  My office will contact you with follow up information.

## 2020-12-03 ENCOUNTER — Encounter (HOSPITAL_COMMUNITY): Payer: Self-pay | Admitting: Thoracic Surgery (Cardiothoracic Vascular Surgery)

## 2020-12-03 NOTE — Op Note (Signed)
NAME: Bobby [name], DYGERT MEDICAL RECORD [id number] ACCOUNT [id number] DATE OF BIRTH:09-05-1946 FACILITY: [facility] LOCATION: [facility] PHYSICIAN:Kraven Calk Chaya Jan, MD  OPERATIVE REPORT  DATE OF PROCEDURE:  12/02/2020  PREOPERATIVE DIAGNOSIS:  Left lower lobe lung nodules.  POSTOPERATIVE DIAGNOSIS:  Left lower lobe lung nodules.  PROCEDURE:  Electromagnetic navigational bronchoscopy with needle aspirations, brushings, and transbronchial biopsies.  SURGEON:  Modesto Charon, MD  ASSISTANT:  None.  ANESTHESIA:  General.  FINDINGS:  Brushings from central left lower lobe lesion deemed adequate.  CLINICAL NOTE:  [name] is a 74 year old man with a history of a right upper lobectomy for lung cancer many years ago.  He has been followed for ground-glass opacities in the left lower lobe, which have increased in size over time.  He was advised to  undergo navigational bronchoscopy for diagnostic purposes.  The patient was advised of the indications, risks, benefits, and alternatives.  He understood and accepted the risks and agreed to proceed.  DESCRIPTION OF PROCEDURE:  [name] was brought to the operating room on 12/02/2020.  He had induction of general anesthesia and was intubated.  A timeout was performed.  Flexible fiberoptic bronchoscopy then was performed via the endotracheal tube.   It revealed a well-healed right upper lobe bronchial stump.  The remainder of the tracheobronchial tree showed normal anatomy and no endobronchial lesions.  There were some thick secretions, and saline was used to clear  those.  The locatable guide for navigation then was placed.  A 90-degree sheath was chosen initially.  Registration was performed.  Unfortunately, I was unable to advance the 90-degree sheath into the appropriate airway at either site.   An 180-degree sheath  then was chosen and registration was reperformed.  The catheter then was navigated to the central larger left lower  lobe ground-glass opacity, to within 5 mm of the center of the lesion.  Sampling then was performed.  All sampling was done with  fluoroscopy.  Three needle aspirations were performed, and then three brushings were performed with a needle brush and a single brushing was performed with a triple brush.  The cytology techs did quick preps on those.  While awaiting those results,  multiple biopsies were taken.  The catheter remained in good proximity and good alignment with the nodule.  The brushings and one of the needle aspirations were deemed adequate for diagnosis.  The biopsies were all sent for permanent pathology.  The  locatable guide then was reinserted, and the sheath was navigated to within a centimeter of the superior segmental ground-glass opacity.  After removal of the locatable guide, the sheath was still within 1.3 centimeter of the center of the lesion with  good alignment.  Sampling was performed with a needle brush as well as multiple biopsies were taken.  These were all sent for permanent pathology.  After completing the biopsies, inspection was made.  There was no bleeding in the airways.  The  bronchoscope was withdrawn.  The patient was extubated in the operating room and taken to the postanesthetic care unit in good condition.  The total fluoroscopy dose was 30 milligray.  IN/NUANCE  D:12/02/2020 T:12/03/2020 JOB:[id number]

## 2020-12-06 LAB — SURGICAL PATHOLOGY

## 2020-12-06 LAB — CYTOLOGY - NON PAP

## 2020-12-08 ENCOUNTER — Encounter: Payer: Self-pay | Admitting: Thoracic Surgery (Cardiothoracic Vascular Surgery)

## 2020-12-08 ENCOUNTER — Ambulatory Visit (INDEPENDENT_AMBULATORY_CARE_PROVIDER_SITE_OTHER): Payer: Medicare PPO | Admitting: Thoracic Surgery (Cardiothoracic Vascular Surgery)

## 2020-12-08 ENCOUNTER — Other Ambulatory Visit: Payer: Self-pay

## 2020-12-08 VITALS — BP 163/77 | HR 81 | Resp 20 | Ht 70.0 in | Wt 232.0 lb

## 2020-12-08 DIAGNOSIS — R911 Solitary pulmonary nodule: Secondary | ICD-10-CM | POA: Diagnosis not present

## 2020-12-08 NOTE — Progress Notes (Signed)
[address]       [place] [address]             [phone]    HPI: Bobby [name] returns to discuss the results of his bronchoscopy.  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  He had a right upper lobectomy for a low-grade stage Ia adenocarcinoma in 2010 by Dr. Arlyce [name].  He has been followed since then.  In 2013 he first was noted to have a central groundglass opacity in the left lower lobe.  More recently that was noted to have increased in size and there was a new groundglass opacity in the superior segment of the left lower lobe.  About 6 months ago he had a PET/CT which showed low-grade activity in the central nodule with SUV of 2.1.  There was no significant activity in the smaller superior segmental nodule.  I did a navigational bronchoscopy and biopsy of both lesions on 12/02/2020.  Both turned out to be adenocarcinomas.  He tolerated the procedure well without any issues.  He denies any chest pain, pressure, tightness, or shortness of breath.  Appetite is good.  No weight loss.  No headaches or visual changes.  He had a 20-pack-year history of smoking prior to quitting in 2000.  That was when he was diagnosed with bladder cancer.  Zubrod Score: At the time of surgery this patient's most appropriate activity status/level should be described as: [x]     0    Normal activity, no symptoms []     1    Restricted in physical strenuous activity but ambulatory, able to do out light work []     2    Ambulatory and capable of self care, unable to do work activities, up and about >50 % of waking hours                              []     3    Only limited self care, in bed greater than 50% of waking hours []     4    Completely disabled, no self care, confined to bed or  chair []     5    Moribund  Past Medical History:  Diagnosis Date  . Arthritis    Right wrist  . BPH (benign prostatic hypertrophy)   . COPD (chronic obstructive pulmonary disease) (HCC)    emphysema  . Frequency of urination   . History of lung cancer    STAGE 1A NON-SMALL CELL LUNG CARCINOMA ---  04-10-2009   S/P RIGHT UPPER LOBECTOMY , NO CHEMORADIATION--  NO RECURRENCE  (ONCOLOGIST-- DR [facility])  . Hyperlipidemia   . Hypertension   . nscl ca dx'd 04/2009   lung  . Pulmonary nodule seen on imaging study    STABLE LEFT LOWER LOBE NODULE  PER CHEST CT--  MONITORED BY DR [facility]  . Sleep apnea    OSA does not use CPAP  . Type 2 diabetes mellitus ([place])   . Urgency of urination    Past Surgical History:  Procedure Laterality Date  . CARDIOVASCULAR STRESS TEST  04-08-2009   NORMAL NUCLEAR STUDY/ NO ISCHEMIA/ EF 60%  . CARPAL TUNNEL RELEASE Right   . COLONOSCOPY  last 2019  . CYST  EXCISION Left 10/03/2018   Procedure: EXCISION OF LEFT AXILLARY SEBACEOUS CYST;  Surgeon: Coralie Keens, MD;  Location: [facility];  Service: General;  Laterality: Left;  . excision of abcess    . KNEE ARTHROSCOPY W/ MENISCECTOMY Left 10-31-2004  . LEFT SHOULDER ARTHROSCOPY/ ACROMIOPLASTY/ DECOMPRESSION/ SYNOVECTOMY  11-20-2007  . POLYPECTOMY    . TRANSURETHRAL RESECTION OF PROSTATE N/A 11/03/2013   Procedure: TRANSURETHRAL RESECTION OF THE PROSTATE WITH GYRUS INSTRUMENTS;  Surgeon: Franchot Gallo, MD;  Location: [facility];  Service: Urology;  Laterality: N/A;  . TRIGGER FINGER RELEASE Right 2019  . VIDEO ASSISTED THORACOSCOPY (VATS)/ LOBECTOMY Right 04-10-2009  DR BURNEY   RIGHT UPPER LOBECTOMY AND NODE DISSECTION  . VIDEO BRONCHOSCOPY WITH ENDOBRONCHIAL NAVIGATION N/A 12/02/2020   Procedure: VIDEO BRONCHOSCOPY WITH ENDOBRONCHIAL NAVIGATION;  Surgeon: Melrose Nakayama, MD;  Location: [facility] OR;  Service: Thoracic;  Laterality: N/A;    Current Outpatient  Medications  Medication Sig Dispense Refill  . allopurinol (ZYLOPRIM) 100 MG tablet Take 100 mg by mouth daily.    Marland Kitchen amLODipine-benazepril (LOTREL) 5-20 MG per capsule Take 1 capsule by mouth every morning.     Marland Kitchen aspirin EC 81 MG tablet Take 81 mg by mouth daily.    . cholecalciferol (VITAMIN D) 1000 UNITS tablet Take 1,000 Units by mouth daily.    . colchicine 0.6 MG tablet Take 0.6 mg by mouth daily.    . fluticasone (FLONASE) 50 MCG/ACT nasal spray Place 1 spray into both nostrils daily as needed for allergies.    Marland Kitchen glyBURIDE (DIABETA) 5 MG tablet Take 5 mg by mouth daily.    . simvastatin (ZOCOR) 20 MG tablet Take 20 mg by mouth daily.  1  . sitaGLIPtan-metformin (JANUMET) 50-500 MG per tablet Take 1 tablet by mouth 2 (two) times daily with a meal.     No current facility-administered medications for this visit.    Physical Exam BP (!) 163/77 (BP Location: Right Arm, Patient Position: Sitting)   Pulse 81   Resp 20   Ht 5\' 10"  (1.778 m)   Wt 232 lb (105.2 kg)   SpO2 95% Comment: RA with mask on  BMI 33.55 kg/m  75 year old man in no acute distress Alert and oriented x3 with no focal deficits No cervical stability or adenopathy Cardiac regular rate and rhythm, no murmur Lungs slightly diminished at right base but otherwise clear No peripheral edema  Diagnostic Tests: CT CHEST WITH CONTRAST  TECHNIQUE: Multidetector CT imaging of the chest was performed during intravenous contrast administration.  CONTRAST:  56mL OMNIPAQUE IOHEXOL 300 MG/ML  SOLN  COMPARISON:  04/29/2020 PET.  04/13/2020 chest CT.  FINDINGS: Cardiovascular: Aortic atherosclerosis. Normal heart size, without pericardial effusion. No central pulmonary embolism, on this non-dedicated study.  Mediastinum/Nodes: Mildly prominent right axillary nodes are favored to be reactive and not pathologic by size criteria. No mediastinal or hilar adenopathy.  Lungs/Pleura: No pleural fluid. Mild right-sided  pleural thickening is unchanged.  Right upper lobectomy.  Similar volume loss adjacent to surgical sutures, including on 74/7.  Mild centrilobular emphysema.  Mixed attenuation vague left lower lobe 8 mm nodule on 88/7 is similar to on the prior exam (when remeasured).  Just cephalad to this, 2 adjacent ill-defined nodules on the order of 4-7 mm are unchanged.  Superior segment left lower lobe sub solid pulmonary nodule measures 2.1 x 2.2 cm on 66/7. Compare 2.3 x 2.3 cm on the prior exam (when remeasured).  Superior segment left lower  lobe ground-glass pulmonary nodule measures 1.1 cm on 27/7 and is similar to on the prior exam (when remeasured).  Upper Abdomen: Possible mild steatosis. Normal imaged portions of the spleen, stomach, pancreas, right adrenal gland, kidneys. Mild left adrenal thickening.  Musculoskeletal: No acute osseous abnormality. Lower thoracic and lower cervical spondylosis.  IMPRESSION: 1. Status post right upper lobectomy, without recurrent or metastatic disease. 2. Left lower lobe ground-glass nodules, including 2 dominant unchanged nodules as detailed above. These remain suspicious for indolent primary bronchogenic carcinoma, likely low-grade adenocarcinoma. 3.  Aortic Atherosclerosis (ICD10-I70.0).   Electronically Signed   By: Abigail Miyamoto M.D.   On: 11/01/2020 13:52 I personally reviewed the CT images.  There are 2 groundglass nodules in the left lower lobe 1 central and 1 in the superior segment.  FINAL MICROSCOPIC DIAGNOSIS:   A. LUNG, LEFT LOWER LOBE CENTRAL, BIOPSY:  - Adenocarcinoma  - See comment   B. LUNG, LEFT LOWER LOBE SUPERIOR, BIOPSY:  - Adenocarcinoma  - See comment   Pulmonary function testing 12/01/2020 FVC 3.74 (99%) FEV1 2.60 (92%), no significant change with bronchodilator TLC 6.22 (88%) RV 2.39 (94%) DLCO 18.64 (73%)  Impression: Bobby [name] is a 75 year old man with a history of stage I  adenocarcinoma of the right upper lobe, tobacco abuse (quit 2000), COPD, bladder cancer, hypertension, hyperlipidemia, benign prostatic hypertrophy, and type 2 diabetes without complication.  He had a right upper lobectomy by Dr. Arlyce [name] in 2000 for a low-grade adenocarcinoma.  He now has 2 synchronous lesions in the left lower lobe so technically is T3, N0, M0, clinical stage IIb.  He does have resectable disease.  He also has adequate pulmonary reserve to tolerate a resection.  We discussed the potential treatment options including stereotactic radiation and surgical resection.  Either these may be combined with chemotherapy given his T3 status.  However given the low-grade nature of the lesion surgical resection likely would be curative and of itself.  I discussed the proposed operation with him.  We would plan to do a robotic left VATS for left lower lobectomy.  As part of that we would do a lymph node dissection.  I informed him of the general nature of the procedure including the need for general anesthesia, the incisions to be used, the use of a drainage tube postoperatively, the expected hospital stay, and the overall recovery.  He understands this will have a relatively greater impact on his respiratory reserve than his previous lobectomy.  I informed him of the indications, risks, benefits, and alternatives.  He understands the risks include, but not limited to death, MI, DVT, PE, bleeding, possible need for transfusion, infection, prolonged air leak, cardiac arrhythmias, as well as the possibility of other unforeseeable complications.  Please strongly favors proceeding with surgical resection.  Plan: Robotic left lower lobectomy on Thursday, 10/16/2021  Melrose Nakayama, MD [facility] [phone]

## 2020-12-08 NOTE — H&P (View-Only) (Signed)
Bobby [name]       Bobby [name],[name] [address]             [phone]    HPI: Bobby [name] returns to discuss the results of his bronchoscopy.  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  He had a right upper lobectomy for a low-grade stage Ia adenocarcinoma in 2010 by Bobby [name].  He has been followed since then.  In 2013 he first was noted to have a central groundglass opacity in the left lower lobe.  More recently that was noted to have increased in size and there was a new groundglass opacity in the superior segment of the left lower lobe.  About 6 months ago he had a PET/CT which showed low-grade activity in the central nodule with SUV of 2.1.  There was no significant activity in the smaller superior segmental nodule.  I did a navigational bronchoscopy and biopsy of both lesions on 12/02/2020.  Both turned out to be adenocarcinomas.  He tolerated the procedure well without any issues.  He denies any chest pain, pressure, tightness, or shortness of breath.  Appetite is good.  No weight loss.  No headaches or visual changes.  He had a 20-pack-year history of smoking prior to quitting in 2000.  That was when he was diagnosed with bladder cancer.  Bobby [name]: At the time of surgery this patient's most appropriate activity status/level should be described as: [x]     0    Normal activity, no symptoms []     1    Restricted in physical strenuous activity but ambulatory, able to do out light work []     2    Ambulatory and capable of self care, unable to do work activities, up and about >50 % of waking hours                              []     3    Only limited self care, in bed greater than 50% of waking hours []     4    Completely disabled, no self care, confined to bed or  chair []     5    Moribund  Past Medical History:  Diagnosis Date  . Arthritis    Right wrist  . BPH (benign prostatic hypertrophy)   . COPD (chronic obstructive pulmonary disease) (HCC)    emphysema  . Frequency of urination   . History of lung cancer    STAGE 1A NON-SMALL CELL LUNG CARCINOMA ---  04-10-2009   S/P RIGHT UPPER LOBECTOMY , NO CHEMORADIATION--  NO RECURRENCE  (ONCOLOGIST-- DR [facility])  . Hyperlipidemia   . Hypertension   . nscl ca dx'd 04/2009   lung  . Pulmonary nodule seen on imaging study    STABLE LEFT LOWER LOBE NODULE  PER CHEST CT--  MONITORED BY DR [facility]  . Sleep apnea    OSA does not use CPAP  . Type 2 diabetes mellitus ([place])   . Urgency of urination    Past Surgical History:  Procedure Laterality Date  . CARDIOVASCULAR STRESS TEST  04-08-2009   NORMAL NUCLEAR STUDY/ NO ISCHEMIA/ EF 60%  . CARPAL TUNNEL RELEASE Right   . COLONOSCOPY  last 2019  . CYST  EXCISION Left 10/03/2018   Procedure: EXCISION OF LEFT AXILLARY SEBACEOUS CYST;  Surgeon: Coralie Keens, MD;  Location: [place];  Service: General;  Laterality: Left;  . excision of abcess    . KNEE ARTHROSCOPY W/ MENISCECTOMY Left 10-31-2004  . LEFT SHOULDER ARTHROSCOPY/ ACROMIOPLASTY/ DECOMPRESSION/ SYNOVECTOMY  11-20-2007  . POLYPECTOMY    . TRANSURETHRAL RESECTION OF PROSTATE N/A 11/03/2013   Procedure: TRANSURETHRAL RESECTION OF THE PROSTATE WITH GYRUS INSTRUMENTS;  Surgeon: Franchot Gallo, MD;  Location: [facility];  Service: Urology;  Laterality: N/A;  . TRIGGER FINGER RELEASE Right 2019  . VIDEO ASSISTED THORACOSCOPY (VATS)/ LOBECTOMY Right 04-10-2009  DR BURNEY   RIGHT UPPER LOBECTOMY AND NODE DISSECTION  . VIDEO BRONCHOSCOPY WITH ENDOBRONCHIAL NAVIGATION N/A 12/02/2020   Procedure: VIDEO BRONCHOSCOPY WITH ENDOBRONCHIAL NAVIGATION;  Surgeon: Melrose Nakayama, MD;  Location: [facility] OR;  Service: Thoracic;  Laterality: N/A;    Current Outpatient  Medications  Medication Sig Dispense Refill  . allopurinol (ZYLOPRIM) 100 MG tablet Take 100 mg by mouth daily.    Marland Kitchen amLODipine-benazepril (LOTREL) 5-20 MG per capsule Take 1 capsule by mouth every morning.     Marland Kitchen aspirin EC 81 MG tablet Take 81 mg by mouth daily.    . cholecalciferol (VITAMIN D) 1000 UNITS tablet Take 1,000 Units by mouth daily.    . colchicine 0.6 MG tablet Take 0.6 mg by mouth daily.    . fluticasone (FLONASE) 50 MCG/ACT nasal spray Place 1 spray into both nostrils daily as needed for allergies.    Marland Kitchen glyBURIDE (DIABETA) 5 MG tablet Take 5 mg by mouth daily.    . simvastatin (ZOCOR) 20 MG tablet Take 20 mg by mouth daily.  1  . sitaGLIPtan-metformin (JANUMET) 50-500 MG per tablet Take 1 tablet by mouth 2 (two) times daily with a meal.     No current facility-administered medications for this visit.    Physical Exam BP (!) 163/77 (BP Location: Right Arm, Patient Position: Sitting)   Pulse 81   Resp 20   Ht 5\' 10"  (1.778 m)   Wt 232 lb (105.2 kg)   SpO2 95% Comment: RA with mask on  BMI 33.74 kg/m  75 year old man in no acute distress Alert and oriented x3 with no focal deficits No cervical stability or adenopathy Cardiac regular rate and rhythm, no murmur Lungs slightly diminished at right base but otherwise clear No peripheral edema  Diagnostic Tests: CT CHEST WITH CONTRAST  TECHNIQUE: Multidetector CT imaging of the chest was performed during intravenous contrast administration.  CONTRAST:  64mL OMNIPAQUE IOHEXOL 300 MG/ML  SOLN  COMPARISON:  04/29/2020 PET.  04/13/2020 chest CT.  FINDINGS: Cardiovascular: Aortic atherosclerosis. Normal heart size, without pericardial effusion. No central pulmonary embolism, on this non-dedicated study.  Mediastinum/Nodes: Mildly prominent right axillary nodes are favored to be reactive and not pathologic by size criteria. No mediastinal or hilar adenopathy.  Lungs/Pleura: No pleural fluid. Mild right-sided  pleural thickening is unchanged.  Right upper lobectomy.  Similar volume loss adjacent to surgical sutures, including on 74/7.  Mild centrilobular emphysema.  Mixed attenuation vague left lower lobe 8 mm nodule on 88/7 is similar to on the prior exam (when remeasured).  Just cephalad to this, 2 adjacent ill-defined nodules on the order of 4-7 mm are unchanged.  Superior segment left lower lobe sub solid pulmonary nodule measures 2.1 x 2.2 cm on 66/7. Compare 2.3 x 2.3 cm on the prior exam (when remeasured).  Superior segment left lower  lobe ground-glass pulmonary nodule measures 1.1 cm on 27/7 and is similar to on the prior exam (when remeasured).  Upper Abdomen: Possible mild steatosis. Normal imaged portions of the spleen, stomach, pancreas, right adrenal gland, kidneys. Mild left adrenal thickening.  Musculoskeletal: No acute osseous abnormality. Lower thoracic and lower cervical spondylosis.  IMPRESSION: 1. Status post right upper lobectomy, without recurrent or metastatic disease. 2. Left lower lobe ground-glass nodules, including 2 dominant unchanged nodules as detailed above. These remain suspicious for indolent primary bronchogenic carcinoma, likely low-grade adenocarcinoma. 3.  Aortic Atherosclerosis (ICD10-I70.0).   Electronically Signed   By: Abigail Miyamoto M.D.   On: 11/01/2020 13:52 I personally reviewed the CT images.  There are 2 groundglass nodules in the left lower lobe 1 central and 1 in the superior segment.  FINAL MICROSCOPIC DIAGNOSIS:   A. LUNG, LEFT LOWER LOBE CENTRAL, BIOPSY:  - Adenocarcinoma  - See comment   B. LUNG, LEFT LOWER LOBE SUPERIOR, BIOPSY:  - Adenocarcinoma  - See comment   Pulmonary function testing 12/01/2020 FVC 3.74 (99%) FEV1 2.60 (92%), no significant change with bronchodilator TLC 6.22 (88%) RV 2.39 (94%) DLCO 18.64 (73%)  Impression: Belvin Gauss is a 75 year old man with a history of stage I  adenocarcinoma of the right upper lobe, tobacco abuse (quit 2000), COPD, bladder cancer, hypertension, hyperlipidemia, benign prostatic hypertrophy, and type 2 diabetes without complication.  He had a right upper lobectomy by Bobby [name] in 2000 for a low-grade adenocarcinoma.  He now has 2 synchronous lesions in the left lower lobe so technically is T3, N0, M0, clinical stage IIb.  He does have resectable disease.  He also has adequate pulmonary reserve to tolerate a resection.  We discussed the potential treatment options including stereotactic radiation and surgical resection.  Either these may be combined with chemotherapy given his T3 status.  However given the low-grade nature of the lesion surgical resection likely would be curative and of itself.  I discussed the proposed operation with him.  We would plan to do a robotic left VATS for left lower lobectomy.  As part of that we would do a lymph node dissection.  I informed him of the general nature of the procedure including the need for general anesthesia, the incisions to be used, the use of a drainage tube postoperatively, the expected hospital stay, and the overall recovery.  He understands this will have a relatively greater impact on his respiratory reserve than his previous lobectomy.  I informed him of the indications, risks, benefits, and alternatives.  He understands the risks include, but not limited to death, MI, DVT, PE, bleeding, possible need for transfusion, infection, prolonged air leak, cardiac arrhythmias, as well as the possibility of other unforeseeable complications.  Please strongly favors proceeding with surgical resection.  Plan: Robotic left lower lobectomy on Thursday, 10/16/2021  Melrose Nakayama, MD [facility] [phone]

## 2020-12-09 ENCOUNTER — Encounter: Payer: Self-pay | Admitting: *Deleted

## 2020-12-09 ENCOUNTER — Other Ambulatory Visit: Payer: Self-pay | Admitting: *Deleted

## 2020-12-09 DIAGNOSIS — C3432 Malignant neoplasm of lower lobe, left bronchus or lung: Secondary | ICD-10-CM

## 2020-12-13 NOTE — Progress Notes (Signed)
[id number] [address], [place] [address] Phone: [phone] Fax: [phone]      Your procedure is scheduled on 12/16/2020.  Report to [facility] Main Entrance "A" at 06:00 A.M., and check in at the Admitting office.  Call this number if you have problems the morning of surgery:  [phone]  Call [phone] if you have any questions prior to your surgery date Monday-Friday 8am-4pm    Remember:  Do not eat or drink after midnight the night before your surgery    Take these medicines the morning of surgery with A SIP OF WATER  allopurinol (ZYLOPRIM) colchicine  fluticasone (FLONASE)- if needed   As of today, STOP taking any Aspirin (unless otherwise instructed by your surgeon) Aleve, Naproxen, Ibuprofen, Motrin, Advil, Goody's, BC's, all herbal medications, fish oil, and all vitamins.  WHAT DO I DO ABOUT MY DIABETES MEDICATION?   Marland Kitchen Do not take oral diabetes medicines (pills):  the morning of surgery.  . THE DAY BEFORE SURGERY, take glyBURIDE (DIABETA). Please take as a morning or lunch dose. Do not take it in the evening. Take sitaGLIPtan-metformin (JANUMET) as you normally do.    . The day of surgery, do not take glyBURIDE (DIABETA) or sitaGLIPtan-metformin (JANUMET).      HOW TO MANAGE YOUR DIABETES BEFORE AND AFTER SURGERY  Why is it important to control my blood sugar before and after surgery? . Improving blood sugar levels before and after surgery helps healing and can limit problems. . A way of improving blood sugar control is eating a healthy diet by: o  Eating less sugar and carbohydrates o  Increasing activity/exercise o  Talking with your doctor about reaching your blood sugar goals . High blood sugars (greater than 180 mg/dL) can raise your risk of infections and slow your recovery, so you will need to focus on controlling your diabetes during the weeks before surgery. . Make sure that the doctor  who takes care of your diabetes knows about your planned surgery including the date and location.  How do I manage my blood sugar before surgery? . Check your blood sugar at least 4 times a day, starting 2 days before surgery, to make sure that the level is not too high or low. o Check your blood sugar the morning of your surgery when you wake up and every 2 hours until you get to the Short Stay unit. . If your blood sugar is less than 70 mg/dL, you will need to treat for low blood sugar: o Do not take insulin. o Treat a low blood sugar (less than 70 mg/dL) with  cup of clear juice (cranberry or apple), 4 glucose tablets, OR glucose gel. Recheck blood sugar in 15 minutes after treatment (to make sure it is greater than 70 mg/dL). If your blood sugar is not greater than 70 mg/dL on recheck, call [phone] o  for further instructions. . Report your blood sugar to the short stay nurse when you get to Short Stay.  . If you are admitted to the hospital after surgery: o Your blood sugar will be checked by the staff and you will probably be given insulin after surgery (instead of oral diabetes medicines) to make sure you have good blood sugar levels. o The goal for blood sugar control after surgery is 80-180 mg/dL.  Do not wear jewelry, make up, or nail polish            Do not wear lotions, powders, perfumes/colognes, or deodorant.            Do not shave 48 hours prior to surgery.  Men may shave face and neck.            Do not bring valuables to the hospital.            [facility] is not responsible for any belongings or valuables.  Do NOT Smoke (Tobacco/Vaping) or drink Alcohol 24 hours prior to your procedure If you use a CPAP at night, you may bring all equipment for your overnight stay.   Contacts, glasses, dentures or bridgework may not be worn into surgery.      For patients admitted to the hospital, discharge time will be determined by your treatment  team.   Patients discharged the day of surgery will not be allowed to drive home, and someone needs to stay with them for 24 hours.    Special instructions:   [facility]- Preparing For Surgery  Before surgery, you can play an important role. Because skin is not sterile, your skin needs to be as free of germs as possible. You can reduce the number of germs on your skin by washing with CHG (chlorahexidine gluconate) Soap before surgery.  CHG is an antiseptic cleaner which kills germs and bonds with the skin to continue killing germs even after washing.    Oral Hygiene is also important to reduce your risk of infection.  Remember - BRUSH YOUR TEETH THE MORNING OF SURGERY WITH YOUR REGULAR TOOTHPASTE  Please do not use if you have an allergy to CHG or antibacterial soaps. If your skin becomes reddened/irritated stop using the CHG.  Do not shave (including legs and underarms) for at least 48 hours prior to first CHG shower. It is OK to shave your face.  Please follow these instructions carefully.   1. Shower the NIGHT BEFORE SURGERY and the MORNING OF SURGERY with CHG Soap.   2. If you chose to wash your hair, wash your hair first as usual with your normal shampoo.  3. After you shampoo, rinse your hair and body thoroughly to remove the shampoo.  4. Use CHG as you would any other liquid soap. You can apply CHG directly to the skin and wash gently with a scrungie or a clean washcloth.   5. Apply the CHG Soap to your body ONLY FROM THE NECK DOWN.  Do not use on open wounds or open sores. Avoid contact with your eyes, ears, mouth and genitals (private parts). Wash Face and genitals (private parts)  with your normal soap.   6. Wash thoroughly, paying special attention to the area where your surgery will be performed.  7. Thoroughly rinse your body with warm water from the neck down.  8. DO NOT shower/wash with your normal soap after using and rinsing off the CHG Soap.  9. Pat yourself dry  with a CLEAN TOWEL.  10. Wear CLEAN PAJAMAS to bed the night before surgery  11. Place CLEAN SHEETS on your bed the night of your first shower and DO NOT SLEEP WITH PETS.   Day of Surgery: Wear Clean/Comfortable clothing the morning of surgery Do not apply any deodorants/lotions.   Remember to brush your teeth WITH YOUR REGULAR TOOTHPASTE.   Please read over the following fact sheets that you were given.

## 2020-12-14 ENCOUNTER — Encounter (HOSPITAL_COMMUNITY)
Admission: RE | Admit: 2020-12-14 | Discharge: 2020-12-14 | Disposition: A | Discharge status: Home / Self Care | Payer: Medicare PPO | Source: Ambulatory Visit | Attending: Thoracic Surgery (Cardiothoracic Vascular Surgery) | Admitting: Thoracic Surgery (Cardiothoracic Vascular Surgery)

## 2020-12-14 ENCOUNTER — Encounter (HOSPITAL_COMMUNITY): Payer: Self-pay

## 2020-12-14 ENCOUNTER — Other Ambulatory Visit: Payer: Self-pay

## 2020-12-14 ENCOUNTER — Other Ambulatory Visit (HOSPITAL_COMMUNITY)
Admission: RE | Admit: 2020-12-14 | Discharge: 2020-12-14 | Disposition: A | Discharge status: Home / Self Care | Payer: Medicare PPO | Source: Ambulatory Visit

## 2020-12-14 ENCOUNTER — Ambulatory Visit (HOSPITAL_COMMUNITY)
Admission: RE | Admit: 2020-12-14 | Discharge: 2020-12-14 | Disposition: A | Discharge status: Home / Self Care | Payer: Medicare PPO | Source: Ambulatory Visit | Attending: Thoracic Surgery (Cardiothoracic Vascular Surgery) | Admitting: Thoracic Surgery (Cardiothoracic Vascular Surgery)

## 2020-12-14 DIAGNOSIS — I700 Atherosclerosis of aorta: Secondary | ICD-10-CM | POA: Diagnosis present

## 2020-12-14 DIAGNOSIS — E119 Type 2 diabetes mellitus without complications: Secondary | ICD-10-CM | POA: Diagnosis present

## 2020-12-14 DIAGNOSIS — C3432 Malignant neoplasm of lower lobe, left bronchus or lung: Secondary | ICD-10-CM | POA: Diagnosis present

## 2020-12-14 DIAGNOSIS — C3411 Malignant neoplasm of upper lobe, right bronchus or lung: Secondary | ICD-10-CM | POA: Diagnosis not present

## 2020-12-14 DIAGNOSIS — Z01818 Encounter for other preprocedural examination: Secondary | ICD-10-CM | POA: Insufficient documentation

## 2020-12-14 DIAGNOSIS — J432 Centrilobular emphysema: Secondary | ICD-10-CM | POA: Diagnosis present

## 2020-12-14 DIAGNOSIS — C3491 Malignant neoplasm of unspecified part of right bronchus or lung: Secondary | ICD-10-CM | POA: Diagnosis not present

## 2020-12-14 DIAGNOSIS — N400 Enlarged prostate without lower urinary tract symptoms: Secondary | ICD-10-CM | POA: Diagnosis present

## 2020-12-14 DIAGNOSIS — J449 Chronic obstructive pulmonary disease, unspecified: Secondary | ICD-10-CM | POA: Diagnosis not present

## 2020-12-14 DIAGNOSIS — J9811 Atelectasis: Secondary | ICD-10-CM | POA: Diagnosis not present

## 2020-12-14 DIAGNOSIS — Z01812 Encounter for preprocedural laboratory examination: Secondary | ICD-10-CM | POA: Insufficient documentation

## 2020-12-14 DIAGNOSIS — Z8551 Personal history of malignant neoplasm of bladder: Secondary | ICD-10-CM | POA: Diagnosis not present

## 2020-12-14 DIAGNOSIS — Z20822 Contact with and (suspected) exposure to covid-19: Secondary | ICD-10-CM | POA: Diagnosis present

## 2020-12-14 DIAGNOSIS — Z7982 Long term (current) use of aspirin: Secondary | ICD-10-CM | POA: Diagnosis not present

## 2020-12-14 DIAGNOSIS — Z4682 Encounter for fitting and adjustment of non-vascular catheter: Secondary | ICD-10-CM | POA: Diagnosis not present

## 2020-12-14 DIAGNOSIS — J189 Pneumonia, unspecified organism: Secondary | ICD-10-CM | POA: Diagnosis not present

## 2020-12-14 DIAGNOSIS — J90 Pleural effusion, not elsewhere classified: Secondary | ICD-10-CM | POA: Diagnosis not present

## 2020-12-14 DIAGNOSIS — I10 Essential (primary) hypertension: Secondary | ICD-10-CM | POA: Diagnosis present

## 2020-12-14 DIAGNOSIS — Z87891 Personal history of nicotine dependence: Secondary | ICD-10-CM | POA: Diagnosis not present

## 2020-12-14 DIAGNOSIS — Z9889 Other specified postprocedural states: Secondary | ICD-10-CM | POA: Diagnosis not present

## 2020-12-14 DIAGNOSIS — R0602 Shortness of breath: Secondary | ICD-10-CM | POA: Diagnosis not present

## 2020-12-14 DIAGNOSIS — Z79899 Other long term (current) drug therapy: Secondary | ICD-10-CM | POA: Diagnosis not present

## 2020-12-14 DIAGNOSIS — R918 Other nonspecific abnormal finding of lung field: Secondary | ICD-10-CM | POA: Diagnosis not present

## 2020-12-14 DIAGNOSIS — J9382 Other air leak: Secondary | ICD-10-CM | POA: Diagnosis not present

## 2020-12-14 DIAGNOSIS — J984 Other disorders of lung: Secondary | ICD-10-CM | POA: Diagnosis not present

## 2020-12-14 DIAGNOSIS — E785 Hyperlipidemia, unspecified: Secondary | ICD-10-CM | POA: Diagnosis present

## 2020-12-14 DIAGNOSIS — G4733 Obstructive sleep apnea (adult) (pediatric): Secondary | ICD-10-CM | POA: Diagnosis present

## 2020-12-14 DIAGNOSIS — Z7984 Long term (current) use of oral hypoglycemic drugs: Secondary | ICD-10-CM | POA: Diagnosis not present

## 2020-12-14 LAB — BLOOD GAS, ARTERIAL
Acid-base deficit: 1.4 mmol/L (ref 0.0–2.0)
Bicarbonate: 22.4 mmol/L (ref 20.0–28.0)
FIO2: 21
O2 Saturation: 98.1 %
Patient temperature: 37
pCO2 arterial: 34.9 mmHg (ref 32.0–48.0)
pH, Arterial: 7.423 (ref 7.350–7.450)
pO2, Arterial: 105 mmHg (ref 83.0–108.0)

## 2020-12-14 LAB — CBC
HCT: 45.6 % (ref 39.0–52.0)
Hemoglobin: 14.2 g/dL (ref 13.0–17.0)
MCH: 26.5 pg (ref 26.0–34.0)
MCHC: 31.1 g/dL (ref 30.0–36.0)
MCV: 85.1 fL (ref 80.0–100.0)
Platelets: 243 10*3/uL (ref 150–400)
RBC: 5.36 MIL/uL (ref 4.22–5.81)
RDW: 15.6 % — ABNORMAL HIGH (ref 11.5–15.5)
WBC: 8.2 10*3/uL (ref 4.0–10.5)
nRBC: 0 % (ref 0.0–0.2)

## 2020-12-14 LAB — URINALYSIS, ROUTINE W REFLEX MICROSCOPIC
Bilirubin Urine: NEGATIVE
Glucose, UA: NEGATIVE mg/dL
Ketones, ur: NEGATIVE mg/dL
Leukocytes,Ua: NEGATIVE
Nitrite: NEGATIVE
Protein, ur: 30 mg/dL — AB
Specific Gravity, Urine: 1.018 (ref 1.005–1.030)
pH: 5 (ref 5.0–8.0)

## 2020-12-14 LAB — COMPREHENSIVE METABOLIC PANEL
ALT: 44 U/L (ref 0–44)
AST: 32 U/L (ref 15–41)
Albumin: 3.7 g/dL (ref 3.5–5.0)
Alkaline Phosphatase: 54 U/L (ref 38–126)
Anion gap: 12 (ref 5–15)
BUN: 11 mg/dL (ref 8–23)
CO2: 20 mmol/L — ABNORMAL LOW (ref 22–32)
Calcium: 9.3 mg/dL (ref 8.9–10.3)
Chloride: 108 mmol/L (ref 98–111)
Creatinine, Ser: 0.89 mg/dL (ref 0.61–1.24)
GFR, Estimated: >60 mL/min (ref >60)
Glucose, Bld: 79 mg/dL (ref 70–99)
Potassium: 3.8 mmol/L (ref 3.5–5.1)
Sodium: 140 mmol/L (ref 135–145)
Total Bilirubin: 0.4 mg/dL (ref 0.3–1.2)
Total Protein: 6.9 g/dL (ref 6.5–8.1)

## 2020-12-14 LAB — PROTIME-INR
INR: 1 (ref 0.8–1.2)
Prothrombin Time: 12.7 seconds (ref 11.4–15.2)

## 2020-12-14 LAB — GLUCOSE, CAPILLARY: Glucose-Capillary: 74 mg/dL (ref 70–99)

## 2020-12-14 LAB — APTT: aPTT: 29 seconds (ref 24–36)

## 2020-12-14 LAB — TYPE AND SCREEN
ABO/RH(D): B POS
Antibody Screen: NEGATIVE

## 2020-12-14 LAB — SARS CORONAVIRUS 2 (TAT 6-24 HRS): SARS Coronavirus 2: NEGATIVE

## 2020-12-14 NOTE — Progress Notes (Signed)
PCP - Seward Carol Cardiologist -   PPM/ICD - denies   Chest x-ray - 12/14/20 EKG - 11/30/20 Stress Test - 04/08/2009 ECHO - denies Cardiac Cath - denies  Sleep Study - yes at the [facility] CPAP - no  Fasting Blood Sugar - 90-110 Checks Blood Sugar once a month  Patient instructed to hold all Aspirin, NSAID's, herbal medications, fish oil and vitamins 7 days prior to surgery.   ERAS Protcol -no   COVID TEST- 12/14/2020   Anesthesia review: no, James notified and clarified that chart didn't need to be reviewed since he just had a bronchoscopy last week.   Patient denies shortness of breath, fever, cough and chest pain at PAT appointment   All instructions explained to the patient, with a verbal understanding of the material. Patient agrees to go over the instructions while at home for a better understanding. Patient also instructed to self quarantine after being tested for COVID-19. The opportunity to ask questions was provided.

## 2020-12-16 ENCOUNTER — Encounter (HOSPITAL_COMMUNITY): Payer: Self-pay | Admitting: Thoracic Surgery (Cardiothoracic Vascular Surgery)

## 2020-12-16 ENCOUNTER — Inpatient Hospital Stay (HOSPITAL_COMMUNITY): Payer: Medicare PPO | Admitting: Physician Assistant

## 2020-12-16 ENCOUNTER — Encounter (HOSPITAL_COMMUNITY)
Admission: RE | Disposition: A | Discharge status: Home / Self Care | Payer: Self-pay | Source: Home / Self Care | Attending: Thoracic Surgery (Cardiothoracic Vascular Surgery)

## 2020-12-16 ENCOUNTER — Other Ambulatory Visit: Payer: Self-pay

## 2020-12-16 ENCOUNTER — Inpatient Hospital Stay (HOSPITAL_COMMUNITY): Payer: Medicare PPO

## 2020-12-16 ENCOUNTER — Inpatient Hospital Stay (HOSPITAL_COMMUNITY): Payer: Medicare PPO | Admitting: Certified Registered Nurse Anesthetist

## 2020-12-16 ENCOUNTER — Inpatient Hospital Stay (HOSPITAL_COMMUNITY)
Admission: RE | Admit: 2020-12-16 | Discharge: 2020-12-20 | DRG: 164 | Disposition: A | Discharge status: Home / Self Care | Payer: Medicare PPO | Attending: Thoracic Surgery (Cardiothoracic Vascular Surgery) | Admitting: Thoracic Surgery (Cardiothoracic Vascular Surgery)

## 2020-12-16 DIAGNOSIS — G4733 Obstructive sleep apnea (adult) (pediatric): Secondary | ICD-10-CM | POA: Diagnosis present

## 2020-12-16 DIAGNOSIS — I700 Atherosclerosis of aorta: Secondary | ICD-10-CM | POA: Diagnosis present

## 2020-12-16 DIAGNOSIS — J432 Centrilobular emphysema: Secondary | ICD-10-CM | POA: Diagnosis present

## 2020-12-16 DIAGNOSIS — Z7982 Long term (current) use of aspirin: Secondary | ICD-10-CM | POA: Diagnosis not present

## 2020-12-16 DIAGNOSIS — Z8551 Personal history of malignant neoplasm of bladder: Secondary | ICD-10-CM | POA: Diagnosis not present

## 2020-12-16 DIAGNOSIS — E119 Type 2 diabetes mellitus without complications: Secondary | ICD-10-CM | POA: Diagnosis present

## 2020-12-16 DIAGNOSIS — Z7984 Long term (current) use of oral hypoglycemic drugs: Secondary | ICD-10-CM

## 2020-12-16 DIAGNOSIS — J9382 Other air leak: Secondary | ICD-10-CM | POA: Diagnosis not present

## 2020-12-16 DIAGNOSIS — Z4682 Encounter for fitting and adjustment of non-vascular catheter: Secondary | ICD-10-CM

## 2020-12-16 DIAGNOSIS — Z79899 Other long term (current) drug therapy: Secondary | ICD-10-CM

## 2020-12-16 DIAGNOSIS — Z20822 Contact with and (suspected) exposure to covid-19: Secondary | ICD-10-CM | POA: Diagnosis present

## 2020-12-16 DIAGNOSIS — N400 Enlarged prostate without lower urinary tract symptoms: Secondary | ICD-10-CM | POA: Diagnosis present

## 2020-12-16 DIAGNOSIS — Z87891 Personal history of nicotine dependence: Secondary | ICD-10-CM

## 2020-12-16 DIAGNOSIS — I10 Essential (primary) hypertension: Secondary | ICD-10-CM | POA: Diagnosis present

## 2020-12-16 DIAGNOSIS — Z902 Acquired absence of lung [part of]: Secondary | ICD-10-CM

## 2020-12-16 DIAGNOSIS — E785 Hyperlipidemia, unspecified: Secondary | ICD-10-CM | POA: Diagnosis present

## 2020-12-16 DIAGNOSIS — C3432 Malignant neoplasm of lower lobe, left bronchus or lung: Secondary | ICD-10-CM | POA: Diagnosis present

## 2020-12-16 DIAGNOSIS — Z938 Other artificial opening status: Secondary | ICD-10-CM

## 2020-12-16 DIAGNOSIS — Z9689 Presence of other specified functional implants: Secondary | ICD-10-CM

## 2020-12-16 HISTORY — PX: LYMPH NODE DISSECTION: SHX5087

## 2020-12-16 HISTORY — PX: INTERCOSTAL NERVE BLOCK: SHX5021

## 2020-12-16 HISTORY — PX: OTHER SURGICAL HISTORY: SHX169

## 2020-12-16 LAB — GLUCOSE, CAPILLARY
Glucose-Capillary: 103 mg/dL — ABNORMAL HIGH (ref 70–99)
Glucose-Capillary: 194 mg/dL — ABNORMAL HIGH (ref 70–99)
Glucose-Capillary: 184 mg/dL — ABNORMAL HIGH (ref 70–99)
Glucose-Capillary: 168 mg/dL — ABNORMAL HIGH (ref 70–99)

## 2020-12-16 LAB — POCT I-STAT 7, (LYTES, BLD GAS, ICA,H+H)
Acid-Base Excess: 1 mmol/L (ref 0.0–2.0)
Bicarbonate: 27.5 mmol/L (ref 20.0–28.0)
Calcium, Ion: 1.26 mmol/L (ref 1.15–1.40)
HCT: 42 % (ref 39.0–52.0)
Hemoglobin: 14.3 g/dL (ref 13.0–17.0)
O2 Saturation: 100 %
Patient temperature: 34.4
Potassium: 3.7 mmol/L (ref 3.5–5.1)
Sodium: 140 mmol/L (ref 135–145)
TCO2: 29 mmol/L (ref 22–32)
pCO2 arterial: 44.6 mmHg (ref 32.0–48.0)
pH, Arterial: 7.385 (ref 7.350–7.450)
pO2, Arterial: 382 mmHg — ABNORMAL HIGH (ref 83.0–108.0)

## 2020-12-16 SURGERY — LOBECTOMY, LUNG, ROBOT-ASSISTED, USING VATS
Anesthesia: General | Site: Chest | Laterality: Left

## 2020-12-16 MED ORDER — INSULIN ASPART 100 UNIT/ML ~~LOC~~ SOLN
0.0000 [IU] | Freq: Three times a day (TID) | SUBCUTANEOUS | Status: DC
  Administered 2020-12-16 (×2): 4 [IU] via SUBCUTANEOUS
  Administered 2020-12-17 – 2020-12-20 (×10): 2 [IU] via SUBCUTANEOUS

## 2020-12-16 MED ORDER — ACETAMINOPHEN 500 MG PO TABS
1000.0000 mg | ORAL_TABLET | Freq: Four times a day (QID) | ORAL | Status: DC
  Administered 2020-12-16 – 2020-12-20 (×15): 1000 mg via ORAL
  Filled 2020-12-16 (×15): qty 2

## 2020-12-16 MED ORDER — LIDOCAINE 2% (20 MG/ML) 5 ML SYRINGE
INTRAMUSCULAR | Status: DC | PRN
  Administered 2020-12-16: 100 mg via INTRAVENOUS

## 2020-12-16 MED ORDER — COLCHICINE 0.6 MG PO TABS
0.6000 mg | ORAL_TABLET | Freq: Every day | ORAL | Status: DC
  Administered 2020-12-17 – 2020-12-20 (×4): 0.6 mg via ORAL
  Filled 2020-12-16 (×4): qty 1

## 2020-12-16 MED ORDER — BUPIVACAINE HCL (PF) 0.5 % IJ SOLN
INTRAMUSCULAR | Status: AC
  Filled 2020-12-16: qty 30

## 2020-12-16 MED ORDER — DEXAMETHASONE SODIUM PHOSPHATE 10 MG/ML IJ SOLN
INTRAMUSCULAR | Status: DC | PRN
  Administered 2020-12-16: 8 mg via INTRAVENOUS

## 2020-12-16 MED ORDER — ACETAMINOPHEN 160 MG/5ML PO SOLN: 1000.0000 mg | Freq: Four times a day (QID) | ORAL | Status: DC

## 2020-12-16 MED ORDER — PROPOFOL 10 MG/ML IV BOLUS
INTRAVENOUS | Status: AC
  Filled 2020-12-16: qty 20

## 2020-12-16 MED ORDER — ROCURONIUM BROMIDE 10 MG/ML (PF) SYRINGE
PREFILLED_SYRINGE | INTRAVENOUS | Status: DC | PRN
  Administered 2020-12-16: 80 mg via INTRAVENOUS
  Administered 2020-12-16: 40 mg via INTRAVENOUS
  Administered 2020-12-16: 10 mg via INTRAVENOUS

## 2020-12-16 MED ORDER — 0.9 % SODIUM CHLORIDE (POUR BTL) OPTIME
TOPICAL | Status: DC | PRN
  Administered 2020-12-16: 1000 mL

## 2020-12-16 MED ORDER — ROCURONIUM BROMIDE 10 MG/ML (PF) SYRINGE
PREFILLED_SYRINGE | INTRAVENOUS | Status: AC
  Filled 2020-12-16: qty 20

## 2020-12-16 MED ORDER — MIDAZOLAM HCL 5 MG/5ML IJ SOLN
INTRAMUSCULAR | Status: DC | PRN
  Administered 2020-12-16: 2 mg via INTRAVENOUS

## 2020-12-16 MED ORDER — ALLOPURINOL 100 MG PO TABS
100.0000 mg | ORAL_TABLET | Freq: Every day | ORAL | Status: DC
  Administered 2020-12-17 – 2020-12-20 (×4): 100 mg via ORAL
  Filled 2020-12-16 (×4): qty 1

## 2020-12-16 MED ORDER — ORAL CARE MOUTH RINSE: 15.0000 mL | Freq: Once | OROMUCOSAL | Status: AC

## 2020-12-16 MED ORDER — HEMOSTATIC AGENTS (NO CHARGE) OPTIME
TOPICAL | Status: DC | PRN
  Administered 2020-12-16: 2 via TOPICAL
  Administered 2020-12-16: 1 via TOPICAL

## 2020-12-16 MED ORDER — PHENYLEPHRINE HCL-NACL 10-0.9 MG/250ML-% IV SOLN
INTRAVENOUS | Status: DC | PRN
  Administered 2020-12-16: 25 ug/min via INTRAVENOUS
  Administered 2020-12-16: 20 ug/min via INTRAVENOUS

## 2020-12-16 MED ORDER — ONDANSETRON HCL 4 MG/2ML IJ SOLN
INTRAMUSCULAR | Status: AC
  Filled 2020-12-16: qty 2

## 2020-12-16 MED ORDER — ONDANSETRON HCL 4 MG/2ML IJ SOLN: 4.0000 mg | Freq: Once | INTRAMUSCULAR | Status: DC | PRN

## 2020-12-16 MED ORDER — SODIUM CHLORIDE 0.9 % IV SOLN: INTRAVENOUS | Status: DC | PRN

## 2020-12-16 MED ORDER — FLUTICASONE PROPIONATE 50 MCG/ACT NA SUSP
1.0000 | Freq: Every day | NASAL | Status: DC | PRN
  Filled 2020-12-16: qty 16

## 2020-12-16 MED ORDER — OXYCODONE HCL 5 MG PO TABS: 5.0000 mg | ORAL_TABLET | Freq: Once | ORAL | Status: DC | PRN

## 2020-12-16 MED ORDER — SODIUM CHLORIDE FLUSH 0.9 % IV SOLN
INTRAVENOUS | Status: DC | PRN
  Administered 2020-12-16: 92 mL

## 2020-12-16 MED ORDER — LACTATED RINGERS IV SOLN: INTRAVENOUS | Status: DC | PRN

## 2020-12-16 MED ORDER — FENTANYL CITRATE (PF) 100 MCG/2ML IJ SOLN
INTRAMUSCULAR | Status: DC | PRN
  Administered 2020-12-16 (×4): 50 ug via INTRAVENOUS

## 2020-12-16 MED ORDER — SUGAMMADEX SODIUM 200 MG/2ML IV SOLN
INTRAVENOUS | Status: DC | PRN
  Administered 2020-12-16: 200 mg via INTRAVENOUS

## 2020-12-16 MED ORDER — LACTATED RINGERS IV SOLN: INTRAVENOUS | Status: DC

## 2020-12-16 MED ORDER — AMLODIPINE BESYLATE 5 MG PO TABS
5.0000 mg | ORAL_TABLET | Freq: Every day | ORAL | Status: DC
Start: 2020-12-17 — End: 2020-12-20
  Administered 2020-12-17 – 2020-12-20 (×4): 5 mg via ORAL
  Filled 2020-12-16 (×4): qty 1

## 2020-12-16 MED ORDER — FENTANYL CITRATE (PF) 250 MCG/5ML IJ SOLN
INTRAMUSCULAR | Status: AC
  Filled 2020-12-16: qty 5

## 2020-12-16 MED ORDER — SODIUM CHLORIDE 0.9 % IV SOLN: INTRAVENOUS | Status: DC

## 2020-12-16 MED ORDER — ONDANSETRON HCL 4 MG/2ML IJ SOLN
INTRAMUSCULAR | Status: DC | PRN
  Administered 2020-12-16: 4 mg via INTRAVENOUS

## 2020-12-16 MED ORDER — MIDAZOLAM HCL 2 MG/2ML IJ SOLN
INTRAMUSCULAR | Status: AC
  Filled 2020-12-16: qty 2

## 2020-12-16 MED ORDER — PHENYLEPHRINE 40 MCG/ML (10ML) SYRINGE FOR IV PUSH (FOR BLOOD PRESSURE SUPPORT)
PREFILLED_SYRINGE | INTRAVENOUS | Status: AC
  Filled 2020-12-16: qty 10

## 2020-12-16 MED ORDER — BISACODYL 5 MG PO TBEC
10.0000 mg | DELAYED_RELEASE_TABLET | Freq: Every day | ORAL | Status: DC
  Administered 2020-12-17 – 2020-12-20 (×4): 10 mg via ORAL
  Filled 2020-12-16 (×4): qty 2

## 2020-12-16 MED ORDER — CHLORHEXIDINE GLUCONATE 0.12 % MT SOLN
15.0000 mL | Freq: Once | OROMUCOSAL | Status: AC
  Administered 2020-12-16: 15 mL via OROMUCOSAL
  Filled 2020-12-16: qty 15

## 2020-12-16 MED ORDER — CHLORHEXIDINE GLUCONATE CLOTH 2 % EX PADS
6.0000 | MEDICATED_PAD | Freq: Every day | CUTANEOUS | Status: DC
  Administered 2020-12-17 – 2020-12-18 (×2): 6 via TOPICAL

## 2020-12-16 MED ORDER — METOPROLOL TARTRATE 5 MG/5ML IV SOLN
INTRAVENOUS | Status: AC
  Filled 2020-12-16: qty 5

## 2020-12-16 MED ORDER — METOPROLOL TARTRATE 5 MG/5ML IV SOLN: 2.5000 mg | Freq: Once | INTRAVENOUS | Status: DC

## 2020-12-16 MED ORDER — OXYCODONE HCL 5 MG/5ML PO SOLN: 5.0000 mg | Freq: Once | ORAL | Status: DC | PRN

## 2020-12-16 MED ORDER — PHENYLEPHRINE HCL (PRESSORS) 10 MG/ML IV SOLN
INTRAVENOUS | Status: DC | PRN
  Administered 2020-12-16: 40 ug via INTRAVENOUS

## 2020-12-16 MED ORDER — HYDROMORPHONE HCL 1 MG/ML IJ SOLN
0.2500 mg | INTRAMUSCULAR | Status: DC | PRN
  Administered 2020-12-16 (×4): 0.5 mg via INTRAVENOUS

## 2020-12-16 MED ORDER — BUPIVACAINE LIPOSOME 1.3 % IJ SUSP
20.0000 mL | INTRAMUSCULAR | Status: DC
  Filled 2020-12-16: qty 20

## 2020-12-16 MED ORDER — ONDANSETRON HCL 4 MG/2ML IJ SOLN: 4.0000 mg | Freq: Four times a day (QID) | INTRAMUSCULAR | Status: DC | PRN

## 2020-12-16 MED ORDER — OXYCODONE HCL 5 MG PO TABS
5.0000 mg | ORAL_TABLET | ORAL | Status: DC | PRN
  Administered 2020-12-16 – 2020-12-19 (×6): 10 mg via ORAL
  Filled 2020-12-16 (×6): qty 2

## 2020-12-16 MED ORDER — HYDROMORPHONE HCL 1 MG/ML IJ SOLN
INTRAMUSCULAR | Status: AC
  Filled 2020-12-16: qty 1

## 2020-12-16 MED ORDER — SIMVASTATIN 20 MG PO TABS
20.0000 mg | ORAL_TABLET | Freq: Every evening | ORAL | Status: DC
  Administered 2020-12-16 – 2020-12-19 (×4): 20 mg via ORAL
  Filled 2020-12-16 (×4): qty 1

## 2020-12-16 MED ORDER — ASPIRIN EC 81 MG PO TBEC
81.0000 mg | DELAYED_RELEASE_TABLET | Freq: Every evening | ORAL | Status: DC
  Administered 2020-12-17 – 2020-12-19 (×3): 81 mg via ORAL
  Filled 2020-12-16 (×3): qty 1

## 2020-12-16 MED ORDER — MORPHINE SULFATE (PF) 2 MG/ML IV SOLN
2.0000 mg | INTRAVENOUS | Status: DC | PRN
  Administered 2020-12-16 – 2020-12-17 (×2): 2 mg via INTRAVENOUS
  Filled 2020-12-16 (×2): qty 1

## 2020-12-16 MED ORDER — AMLODIPINE BESY-BENAZEPRIL HCL 5-20 MG PO CAPS: 1.0000 | ORAL_CAPSULE | ORAL | Status: DC

## 2020-12-16 MED ORDER — SENNOSIDES-DOCUSATE SODIUM 8.6-50 MG PO TABS
1.0000 | ORAL_TABLET | Freq: Every day | ORAL | Status: DC
  Administered 2020-12-16 – 2020-12-19 (×4): 1 via ORAL
  Filled 2020-12-16 (×4): qty 1

## 2020-12-16 MED ORDER — ACETAMINOPHEN 10 MG/ML IV SOLN: 1000.0000 mg | Freq: Once | INTRAVENOUS | Status: DC | PRN

## 2020-12-16 MED ORDER — BENAZEPRIL HCL 20 MG PO TABS
20.0000 mg | ORAL_TABLET | Freq: Every day | ORAL | Status: DC
  Administered 2020-12-17 – 2020-12-20 (×4): 20 mg via ORAL
  Filled 2020-12-16 (×5): qty 1

## 2020-12-16 MED ORDER — KETOROLAC TROMETHAMINE 15 MG/ML IJ SOLN
15.0000 mg | Freq: Four times a day (QID) | INTRAMUSCULAR | Status: DC | PRN
  Administered 2020-12-16: 15 mg via INTRAVENOUS
  Filled 2020-12-16: qty 1

## 2020-12-16 MED ORDER — CEFAZOLIN SODIUM-DEXTROSE 2-4 GM/100ML-% IV SOLN
2.0000 g | Freq: Three times a day (TID) | INTRAVENOUS | Status: AC
Start: 2020-12-16 — End: 2020-12-17
  Administered 2020-12-16 – 2020-12-17 (×2): 2 g via INTRAVENOUS
  Filled 2020-12-16 (×2): qty 100

## 2020-12-16 MED ORDER — LIDOCAINE 2% (20 MG/ML) 5 ML SYRINGE
INTRAMUSCULAR | Status: AC
  Filled 2020-12-16: qty 5

## 2020-12-16 MED ORDER — HYDRALAZINE HCL 20 MG/ML IJ SOLN
10.0000 mg | Freq: Four times a day (QID) | INTRAMUSCULAR | Status: AC | PRN
  Filled 2020-12-16: qty 0.5

## 2020-12-16 MED ORDER — SODIUM CHLORIDE 0.9 % IR SOLN
Status: DC | PRN
  Administered 2020-12-16: 1000 mL

## 2020-12-16 MED ORDER — DEXAMETHASONE SODIUM PHOSPHATE 10 MG/ML IJ SOLN
INTRAMUSCULAR | Status: AC
  Filled 2020-12-16: qty 1

## 2020-12-16 MED ORDER — ENOXAPARIN SODIUM 40 MG/0.4ML ~~LOC~~ SOLN
40.0000 mg | Freq: Every day | SUBCUTANEOUS | Status: DC
  Administered 2020-12-17 – 2020-12-19 (×4): 40 mg via SUBCUTANEOUS
  Filled 2020-12-16 (×4): qty 0.4

## 2020-12-16 MED ORDER — CEFAZOLIN SODIUM-DEXTROSE 2-4 GM/100ML-% IV SOLN
2.0000 g | INTRAVENOUS | Status: AC
  Administered 2020-12-16: 2 g via INTRAVENOUS
  Filled 2020-12-16: qty 100

## 2020-12-16 MED ORDER — PROPOFOL 10 MG/ML IV BOLUS
INTRAVENOUS | Status: DC | PRN
  Administered 2020-12-16: 80 mg via INTRAVENOUS

## 2020-12-16 SURGICAL SUPPLY — 126 items
ADH SKN CLS APL DERMABOND .7 (GAUZE/BANDAGES/DRESSINGS) ×1
ADH SKN CLS LQ APL DERMABOND (GAUZE/BANDAGES/DRESSINGS) ×1
BAG SPEC RTRVL C1550 15 (MISCELLANEOUS) ×1
BAG TISS RTRVL C300 12X14 (MISCELLANEOUS) ×1
BLADE CLIPPER SURG (BLADE) IMPLANT
CANISTER SUCT 3000ML PPV (MISCELLANEOUS) ×4 IMPLANT
CANNULA REDUC XI 12-8 STAPL (CANNULA) ×4
CANNULA REDUCER 12-8 DVNC XI (CANNULA) ×2 IMPLANT
CATH THORACIC 28FR (CATHETERS) IMPLANT
CATH THORACIC 28FR RT ANG (CATHETERS) IMPLANT
CATH THORACIC 36FR (CATHETERS) IMPLANT
CATH THORACIC 36FR RT ANG (CATHETERS) IMPLANT
CLIP VESOCCLUDE MED 6/CT (CLIP) IMPLANT
CNTNR URN SCR LID CUP LEK RST (MISCELLANEOUS) ×5 IMPLANT
CONN ST 1/4X3/8  BEN (MISCELLANEOUS) ×2
CONN ST 1/4X3/8 BEN (MISCELLANEOUS) IMPLANT
CONN Y 3/8X3/8X3/8  BEN (MISCELLANEOUS)
CONN Y 3/8X3/8X3/8 BEN (MISCELLANEOUS) IMPLANT
CONT SPEC 4OZ STRL OR WHT (MISCELLANEOUS) ×26
DEFOGGER SCOPE WARMER CLEARIFY (MISCELLANEOUS) ×2 IMPLANT
DERMABOND ADHESIVE PROPEN (GAUZE/BANDAGES/DRESSINGS) ×1
DERMABOND ADVANCED (GAUZE/BANDAGES/DRESSINGS) ×1
DERMABOND ADVANCED .7 DNX12 (GAUZE/BANDAGES/DRESSINGS) ×1 IMPLANT
DERMABOND ADVANCED .7 DNX6 (GAUZE/BANDAGES/DRESSINGS) IMPLANT
DRAIN CHANNEL 28F RND 3/8 FF (WOUND CARE) IMPLANT
DRAIN CHANNEL 32F RND 10.7 FF (WOUND CARE) IMPLANT
DRAPE ARM DVNC X/XI (DISPOSABLE) ×4 IMPLANT
DRAPE COLUMN DVNC XI (DISPOSABLE) ×1 IMPLANT
DRAPE CV SPLIT W-CLR ANES SCRN (DRAPES) ×2 IMPLANT
DRAPE DA VINCI XI ARM (DISPOSABLE) ×8
DRAPE DA VINCI XI COLUMN (DISPOSABLE) ×2
DRAPE HALF SHEET 40X57 (DRAPES) ×1 IMPLANT
DRAPE INCISE IOBAN 66X45 STRL (DRAPES) IMPLANT
DRAPE ORTHO SPLIT 77X108 STRL (DRAPES) ×2
DRAPE SURG ORHT 6 SPLT 77X108 (DRAPES) ×1 IMPLANT
ELECT BLADE 6.5 EXT (BLADE) ×2 IMPLANT
ELECT REM PT RETURN 9FT ADLT (ELECTROSURGICAL) ×2
ELECTRODE REM PT RTRN 9FT ADLT (ELECTROSURGICAL) ×1 IMPLANT
GAUZE KITTNER 4X5 RF (MISCELLANEOUS) ×5 IMPLANT
GAUZE SPONGE 4X4 12PLY STRL (GAUZE/BANDAGES/DRESSINGS) ×2 IMPLANT
GLOVE SURG SS PI 8.0 STRL IVOR (GLOVE) ×2 IMPLANT
GLOVE SURG SYN 7.5  E (GLOVE)
GLOVE SURG SYN 7.5 E (GLOVE) IMPLANT
GLOVE SURG SYN 7.5 PF PI (GLOVE) IMPLANT
GLOVE TRIUMPH SURG SIZE 7.5 (KITS) IMPLANT
GOWN STRL REUS W/ TWL LRG LVL3 (GOWN DISPOSABLE) ×2 IMPLANT
GOWN STRL REUS W/ TWL XL LVL3 (GOWN DISPOSABLE) ×3 IMPLANT
GOWN STRL REUS W/TWL 2XL LVL3 (GOWN DISPOSABLE) ×2 IMPLANT
GOWN STRL REUS W/TWL LRG LVL3 (GOWN DISPOSABLE) ×4
GOWN STRL REUS W/TWL XL LVL3 (GOWN DISPOSABLE) ×6
HEMOSTAT SURGICEL 2X14 (HEMOSTASIS) ×6 IMPLANT
IRRIGATION STRYKERFLOW (MISCELLANEOUS) ×1 IMPLANT
IRRIGATOR STRYKERFLOW (MISCELLANEOUS) ×2
KIT BASIN OR (CUSTOM PROCEDURE TRAY) ×2 IMPLANT
KIT SUCTION CATH 14FR (SUCTIONS) IMPLANT
KIT TURNOVER KIT B (KITS) ×2 IMPLANT
LOOP VESSEL SUPERMAXI WHITE (MISCELLANEOUS) IMPLANT
NDL HYPO 25GX1X1/2 BEV (NEEDLE) ×1 IMPLANT
NDL SPNL 22GX3.5 QUINCKE BK (NEEDLE) ×1 IMPLANT
NEEDLE HYPO 25GX1X1/2 BEV (NEEDLE) ×2 IMPLANT
NEEDLE SPNL 22GX3.5 QUINCKE BK (NEEDLE) ×2 IMPLANT
NS IRRIG 1000ML POUR BTL (IV SOLUTION) ×4 IMPLANT
PACK CHEST (CUSTOM PROCEDURE TRAY) ×2 IMPLANT
PAD ARMBOARD 7.5X6 YLW CONV (MISCELLANEOUS) ×4 IMPLANT
PORT ACCESS TROCAR AIRSEAL 12 (TROCAR) ×1 IMPLANT
PORT ACCESS TROCAR AIRSEAL 5M (TROCAR) ×1
PROGEL SPRAY TIP 11IN (MISCELLANEOUS) ×2
RELOAD STAPLE 45 2.5 WHT DVNC (STAPLE) IMPLANT
RELOAD STAPLE 45 3.5 BLU DVNC (STAPLE) IMPLANT
RELOAD STAPLE 45 4.3 GRN DVNC (STAPLE) IMPLANT
RELOAD STAPLER 2.5X45 WHT DVNC (STAPLE) ×3 IMPLANT
RELOAD STAPLER 3.5X45 BLU DVNC (STAPLE) ×2 IMPLANT
RELOAD STAPLER 4.3X45 GRN DVNC (STAPLE) ×2 IMPLANT
SCISSORS LAP 5X35 DISP (ENDOMECHANICALS) IMPLANT
SEAL CANN UNIV 5-8 DVNC XI (MISCELLANEOUS) ×2 IMPLANT
SEAL XI 5MM-8MM UNIVERSAL (MISCELLANEOUS) ×4
SEALANT PROGEL (MISCELLANEOUS) ×1 IMPLANT
SEALANT SURG COSEAL 4ML (VASCULAR PRODUCTS) IMPLANT
SEALANT SURG COSEAL 8ML (VASCULAR PRODUCTS) IMPLANT
SET TRI-LUMEN FLTR TB AIRSEAL (TUBING) ×2 IMPLANT
SHEARS HARMONIC HDI 20CM (ELECTROSURGICAL) IMPLANT
SOLUTION ELECTROLUBE (MISCELLANEOUS) ×2 IMPLANT
SPONGE INTESTINAL PEANUT (DISPOSABLE) IMPLANT
SPONGE TONSIL TAPE 1 RFD (DISPOSABLE) IMPLANT
STAPLER 45 SUREFORM CVD (STAPLE) ×2
STAPLER 45 SUREFORM CVD DVNC (STAPLE) IMPLANT
STAPLER CANNULA SEAL DVNC XI (STAPLE) ×2 IMPLANT
STAPLER CANNULA SEAL XI (STAPLE) ×4
STAPLER RELOAD 2.5X45 WHITE (STAPLE) ×6
STAPLER RELOAD 2.5X45 WHT DVNC (STAPLE) ×3
STAPLER RELOAD 3.5X45 BLU DVNC (STAPLE) ×2
STAPLER RELOAD 3.5X45 BLUE (STAPLE) ×4
STAPLER RELOAD 4.3X45 GREEN (STAPLE) ×4
STAPLER RELOAD 4.3X45 GRN DVNC (STAPLE) ×2
SUT PDS AB 3-0 SH 27 (SUTURE) IMPLANT
SUT PROLENE 4 0 RB 1 (SUTURE)
SUT PROLENE 4-0 RB1 .5 CRCL 36 (SUTURE) IMPLANT
SUT SILK  1 MH (SUTURE) ×2
SUT SILK 1 MH (SUTURE) ×1 IMPLANT
SUT SILK 1 TIES 10X30 (SUTURE) ×2 IMPLANT
SUT SILK 2 0 SH (SUTURE) ×2 IMPLANT
SUT SILK 2 0SH CR/8 30 (SUTURE) IMPLANT
SUT SILK 3 0SH CR/8 30 (SUTURE) ×1 IMPLANT
SUT VIC AB 0 CT1 27 (SUTURE) ×2
SUT VIC AB 0 CT1 27XBRD ANBCTR (SUTURE) IMPLANT
SUT VIC AB 1 CTX 36 (SUTURE)
SUT VIC AB 1 CTX36XBRD ANBCTR (SUTURE) ×1 IMPLANT
SUT VIC AB 2-0 CT1 27 (SUTURE) ×2
SUT VIC AB 2-0 CT1 TAPERPNT 27 (SUTURE) IMPLANT
SUT VIC AB 2-0 CTX 36 (SUTURE) ×1 IMPLANT
SUT VIC AB 3-0 MH 27 (SUTURE) IMPLANT
SUT VIC AB 3-0 X1 27 (SUTURE) ×6 IMPLANT
SUT VICRYL 0 TIES 12 18 (SUTURE) ×2 IMPLANT
SUT VICRYL 0 UR6 27IN ABS (SUTURE) ×4 IMPLANT
SUT VICRYL 2 TP 1 (SUTURE) IMPLANT
SYR 20CC LL (SYRINGE) ×4 IMPLANT
SYSTEM RETRIEVAL ANCHOR 12 (MISCELLANEOUS) ×1 IMPLANT
SYSTEM RETRIEVAL ANCHOR 15 (MISCELLANEOUS) ×1 IMPLANT
SYSTEM SAHARA CHEST DRAIN ATS (WOUND CARE) ×2 IMPLANT
TAPE CLOTH 4X10 WHT NS (GAUZE/BANDAGES/DRESSINGS) ×2 IMPLANT
TAPE CLOTH SURG 4X10 WHT LF (GAUZE/BANDAGES/DRESSINGS) ×1 IMPLANT
TIP APPLICATOR SPRAY EXTEND 16 (VASCULAR PRODUCTS) IMPLANT
TIP SPRAY PROGEL 11IN (MISCELLANEOUS) IMPLANT
TOWEL GREEN STERILE (TOWEL DISPOSABLE) ×2 IMPLANT
TRAY FOLEY MTR SLVR 16FR STAT (SET/KITS/TRAYS/PACK) ×2 IMPLANT
WATER STERILE IRR 1000ML POUR (IV SOLUTION) ×2 IMPLANT

## 2020-12-16 NOTE — Anesthesia Preprocedure Evaluation (Signed)
Anesthesia Evaluation  Patient identified by MRN, date of birth, ID band Patient awake    Reviewed: Allergy & Precautions, H&P , NPO status , Patient's Chart, lab work & pertinent test results  Airway Mallampati: II  TM Distance: <3 FB Neck ROM: Full    Dental no notable dental hx.    Pulmonary sleep apnea , COPD, former smoker,    Pulmonary exam normal breath sounds clear to auscultation       Cardiovascular hypertension, Normal cardiovascular exam Rhythm:Regular Rate:Normal     Neuro/Psych negative neurological ROS  negative psych ROS   GI/Hepatic negative GI ROS, Neg liver ROS,   Endo/Other  negative endocrine ROSdiabetes  Renal/GU negative Renal ROS  negative genitourinary   Musculoskeletal negative musculoskeletal ROS (+)   Abdominal   Peds negative pediatric ROS (+)  Hematology negative hematology ROS (+)   Anesthesia Other Findings   Reproductive/Obstetrics negative OB ROS                             Anesthesia Physical Anesthesia Plan  ASA: III  Anesthesia Plan: General   Post-op Pain Management:    Induction: Intravenous  PONV Risk Score and Plan: 2 and Ondansetron, Dexamethasone and Treatment may vary due to age or medical condition  Airway Management Planned: Double Lumen EBT  Additional Equipment: Arterial line, CVP and Ultrasound Guidance Line Placement  Intra-op Plan:   Post-operative Plan: Extubation in OR  Informed Consent: I have reviewed the patients History and Physical, chart, labs and discussed the procedure including the risks, benefits and alternatives for the proposed anesthesia with the patient or authorized representative who has indicated his/her understanding and acceptance.     Dental advisory given  Plan Discussed with: CRNA and Surgeon  Anesthesia Plan Comments:         Anesthesia Quick Evaluation

## 2020-12-16 NOTE — Op Note (Signed)
NAME: Bobby [name], Bobby [name] MEDICAL RECORD NW:[id number] ACCOUNT [id number] DATE OF BIRTH:August 01, 1946 FACILITY: [facility] LOCATION: [facility] PHYSICIAN:Krupa Stege Chaya Jan, MD  OPERATIVE REPORT  DATE OF PROCEDURE:  12/16/2020  PREOPERATIVE DIAGNOSIS:  Adenocarcinoma of left lower lobe T3, N0.  POSTOPERATIVE DIAGNOSIS:  Adenocarcinoma of left lower lobe T3, N0.  PROCEDURE:   Xi Robotic-assisted left lower lobectomy,  Lymph node dissection, and Intercostal nerve blocks levels 3 through 10.  SURGEON:  Modesto Charon, MD  ASSISTANT:  Enid Cutter, PA-C  ANESTHESIA:  General.  FINDINGS:  Enlarged but otherwise normal appearing nodes.  Bronchial margin negative for tumor.  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  He has been followed for ground-glass opacities in the  left lower lobe for several years.  Recently, there has been an increase in size and there was low grade activity on PET.  A navigational bronchoscopy and biopsy of both the central and superior segmental lesions revealed both were adenocarcinomas.  He  was offered a left lower lobectomy for treatment.  The indications, risks, benefits, and alternatives were discussed in detail with the patient.  He understood and accepted the risks and agreed to proceed.  OPERATIVE NOTE:  Bobby [name] was brought to the preoperative holding area on 12/16/2020.  Anesthesia placed an arterial blood pressure monitoring line and a central venous catheter.  He was taken to the operating room, anesthetized and intubated with a double  lumen endotracheal tube.  Intravenous antibiotics were administered.  A Foley catheter was placed.  Sequential compression devices were placed on the calves for DVT prophylaxis.  He was placed in a right lateral decubitus position and the left chest was  prepped and  draped in the usual sterile fashion.  Active warming was in place.  Single lung ventilation of the right lung was initiated and was tolerated well throughout the procedure.  A timeout was performed.  A solution containing 20 mL of liposomal bupivacaine, 30 mL of 0.5% bupivacaine and 50 mL of saline was prepared.  This was used for the intercostal nerve blocks as well as for local anesthesia at the incisions.  An incision was  made in the 8th interspace in the mid axillary line, an 8 mm port was inserted.  The thoracoscope was advanced into the chest.  There was good isolation of the left lung, although it was relatively slow to deflate.  Carbon dioxide was insufflated per  protocol.  A 12 mm port was placed in the 8th interspace 5 cm anterior to the camera port and a 12 mm AirSeal port was placed in the 10th interspace centered between the 2 anterior ports.  The AirSeal device was connected.  Intercostal nerve blocks then  were performed from the 3rd to the 10th interspace.  10 mL of the bupivacaine solution was injected into a subpleural plane at each level using a needle from a posterior approach.  An additional 12 mm port was placed in the 8th interspace 5 cm  posterior to the camera port and then an additional 5 cm posterior to that an 8 mm port was placed for the retraction arm.  The robotic instruments were inserted with thoracoscopic visualization.  The inferior ligament was divided.  All lymph nodes that were encountered during the dissection were removed and sent as separate specimens for permanent pathology.  Level 9, 8, 7, 10,  11, 12 and level 5 nodes were all sent.  There were multiple nodes at  some stations.  The pleural reflection then was divided at the hilum posteriorly and then retracting the upper lobe inferiorly the pleural reflection was divided over the aortopulmonary window.  The nodes were removed from that area.  The lung then was  retracted posteriorly and the inferior and  superior pulmonary veins were identified.  A level 11 node was removed.  The fissure was completed using the robotic stapler with blue cartridges.  At this point, the inferior pulmonary vein was encircled and then  divided with the stapler using a vascular cartridge.  A vascular cartridge was also used on the pulmonary artery branches.  The superior segmental branch was dissected out separately and then divided with the vascular stapler and then the basilar  segmental trunk was encircled and divided with the stapler as well.  The stapler then was placed across the left lower lobe bronchus at its origin.  A green cartridge was used.  The stapler was closed.  A test inflation showed good aeration of the upper  lobe.  The stapler then was fired, transecting the bronchus.  The sponges and vessel loop that had been used during the procedure were removed.  The lower lobe was placed into a 12 mm endoscopic retrieval bag.  Unfortunately, the lung would not go  completely into the bag, but the majority of the lower lobe did go into the bag.  The bag was brought down to the lower portion of the chest, and the robotic instruments were removed and the robot was undocked.  The anterior 8th interspace incision was lengthened to approximately 4 cm.  The lower lobe was removed.  This was rather difficult, but the lobe was removed and sent for frozen section of the bronchial margin, which returned with no tumor seen.  All staple lines were inspected and there was good hemostasis.  The chest was copiously irrigated with warm saline.  A test inflation to 30 cm of water revealed no leakage from the bronchial stump.  A 28-French Blake drain was placed through the original  port incision and directed to the apex.  It was secured to skin with a #1 silk suture.  Dual-lung ventilation was resumed.  The incisions were closed with 0 Vicryl fascial sutures and 3-0 Vicryl subcuticular sutures.  Dermabond was applied.  The chest  tube  was placed to Pleur-Evac on waterseal.  The patient then was extubated in the operating room and taken to the [facility] in good condition.  All sponge, needle and instrument counts were correct at the end of the procedure. HN/NUANCE  D:12/16/2020 T:12/16/2020 JOB:[id number]

## 2020-12-16 NOTE — Brief Op Note (Signed)
12/16/2020  11:40 AM  PATIENT:  Bobby [name]  75 y.o. male  PRE-OPERATIVE DIAGNOSIS:  Left Lower Lobe ADENOCARCINOMA  POST-OPERATIVE DIAGNOSIS:  Left Lower Lobe ADENOCARCINOMA  PROCEDURE:  XI ROBOTIC ASSISTED THORASCOPY-LEFT LOWER LOBECTOMY WITH LYMPH NODE DISSECTION  SURGEON: Melrose Nakayama, MD - Primary  PHYSICIAN ASSISTANT: Richey Doolittle  ANESTHESIA:   general  EBL:  75 mL   BLOOD ADMINISTERED:none  DRAINS: 62 Blake drain left pleural space   LOCAL MEDICATIONS USED:  Left intercostal and local Exparel    SPECIMEN:  Source of Specimen:  Left lower lung lobe, multiple mediastinal lymph nodes  DISPOSITION OF SPECIMEN:  PATHOLOGY  COUNTS:  YES  DICTATION: .Dragon Dictation  PLAN OF CARE: Admit to inpatient   PATIENT DISPOSITION:  PACU - hemodynamically stable.   Delay start of Pharmacological VTE agent (>24hrs) due to surgical blood loss or risk of bleeding: no

## 2020-12-16 NOTE — Plan of Care (Signed)
  Problem: Education: Goal: Knowledge of the prescribed therapeutic regimen will improve Outcome: Progressing   Problem: Education: Goal: Knowledge of disease or condition will improve Outcome: Progressing   Problem: Clinical Measurements: Goal: Postoperative complications will be avoided or minimized Outcome: Progressing   Problem: Pain Management: Goal: Pain level will decrease Outcome: Progressing   Problem: Skin Integrity: Goal: Wound healing without signs and symptoms infection will improve Outcome: Progressing

## 2020-12-16 NOTE — Discharge Instructions (Signed)
Discharge Instructions:  1. You may shower, please wash incisions daily with soap and water and keep dry.  If you wish to cover wounds with dressing you may do so but please keep clean and change daily.  No tub baths or swimming until incisions have completely healed.  If your incisions become red or develop any drainage please call our office at [phone]  2. No Driving until cleared by Dr. Leonarda Salon office and you are no longer using narcotic pain medications  3. Monitor your weight daily.. Please use the same scale and weigh at same time... If you gain 5-10 lbs in 48 hours with associated lower extremity swelling, please contact our office at [phone]  4. Fever of 101.5 for at least 24 hours with no source, please contact our office at [phone]  5. Activity- up as tolerated, please walk at least 3 times per day.  Avoid strenuous activity, no lifting, pushing, or pulling with your arms over 8-10 lbs for a minimum of 6 weeks  6. If any questions or concerns arise, please do not hesitate to contact our office at [facility], Care After What can I expect after the procedure? After the procedure, it is common to have:  Some pain and soreness in your chest.  Pain when you breathe in or cough.  Trouble pooping (constipation).  Tiredness.  Trouble sleeping. Follow these instructions at home: Preventing lung infection  Take deep breaths and cough often. This helps clear mucus and opens your lungs. Doing this helps prevent lung infection (pneumonia).  Use an incentive spirometer if told. This tool shows how much you fill your lungs with each breath.  Coughing may hurt less if you try to support your chest. Try one of these when you cough: ? Hold a pillow on your chest. ? Place both hands flat on your cut or cuts from surgery (incisions).  Do not smoke or use any products that contain nicotine or tobacco. If you need help quitting,  ask your doctor.  Stay away when people are smoking (avoid secondhand smoke).    Medicines  Take over-the-counter and prescription medicines only as told by your doctor.  If you have pain, take pain medicine before your pain gets very bad. This is important. Doing this will help you breathe and cough more comfortably.  If you were prescribed an antibiotic medicine, take it as told by your doctor. Do not stop using the antibiotic even if you start to feel better.  If told, take steps to prevent problems with pooping (constipation). You may need to: ? Drink enough fluid to keep your pee (urine) pale yellow. ? Take medicines. You will be told what medicines to take. ? Eat foods that are high in fiber. These include beans, whole grains, and fresh fruits and vegetables. ? Limit foods that are high in fat and sugar. These include fried or sweet foods.  Ask your doctor if you should avoid driving or using machines while you are taking your medicine. Bathing  Do not take baths, swim, or use a hot tub.  Ask your doctor about taking showers or sponge baths. Caring for your incision from surgery  Follow instructions from your doctor about how to take care of your incision. Make sure you: ? Wash your hands with soap and water for at least 20 seconds before and after you change your bandage. If you cannot use soap and water, use hand sanitizer. ? Change your bandage as told. ?  Leave stitches, skin glue, or staples in place for at least 2 weeks. ? Leave tape strips alone unless you are told to take them off. You may trim the edges of the tape strips if they curl up.  Keep your bandage dry until it is taken off.  Check your incision every day for signs of infection. Check for: ? Redness, swelling, or more pain. ? Fluid or blood. ? Warmth. ? Pus or a bad smell.    Activity  Avoid activities that use your chest muscles for at least 3-4 weeks.  Do not lift anything that is heavier than 10  lb (4.5 kg), or the limit that you are told.  Return to your normal activities when your doctor says that it is safe.  Rest as told by your doctor.  Get up to take short walks every 1 to 2 hours. Ask for help if you feel weak or unsteady.  Do exercises as told by your doctor. General instructions  If you were given a sedative during your procedure, do not drive or use machines until your doctor says that it is safe. A sedative is a medicine that helps you relax.  If you have a chest tube, care for it as told.  Do not travel by airplane during the 2 weeks after your chest tube is taken out, or until your doctor says that this is safe.  Keep all follow-up visits. Contact a doctor if:  You have any of these signs of infection around an incision: ? Redness, swelling, or more pain. ? Fluid or blood. ? Warmth. ? Pus or a bad smell.  You have a fever or chills.  You feel like you may vomit or you vomit.  Your pain does not get better with medicine. Get help right away if:  You have chest pain.  You have fast or uneven heartbeats.  You get a rash.  You are short of breath.  You have trouble breathing.  You are mixed up (confused).  You have trouble talking.  You feel weak, light-headed, or dizzy.  You faint. These symptoms may be an emergency. Get help right away. Call your local emergency services (911 in the [place]  Do not wait to see if the symptoms will go away.  Do not drive yourself to the hospital. Summary  Take deep breaths and cough often. This helps clear mucus and opens your lungs. Doing this helps prevent lung infection (pneumonia).  If you have pain, take pain medicine before your pain gets very bad.  Check your incision from surgery every day for signs of infection. Contact a doctor if you have signs of infection.  Return to your normal activities when your doctor says that it is safe. This information is not intended to replace advice given to  you by your health care provider. Make sure you discuss any questions you have with your health care provider. Document Revised: 08/13/2020 Document Reviewed: 08/13/2020 Elsevier Patient Education  2021 [employer].

## 2020-12-16 NOTE — Interval H&P Note (Signed)
History and Physical Interval Note:  12/16/2020 7:47 AM  Bobby [name]  has presented today for surgery, with the diagnosis of Left Lower Lobe ADENOCARCINOMA.  The various methods of treatment have been discussed with the patient and family. After consideration of risks, benefits and other options for treatment, the patient has consented to  Procedure(s): XI ROBOTIC ASSISTED THORASCOPY-LEFT LOWER LOBECTOMY (Left) as a surgical intervention.  [name] history has been reviewed, patient examined, no change in status, stable for surgery.  I have reviewed the patient's chart and labs.  Questions were answered to the patient's satisfaction.     Melrose Nakayama

## 2020-12-16 NOTE — Anesthesia Procedure Notes (Signed)
Arterial Line Insertion Start/End1/13/2022 7:00 AM, 12/16/2020 7:10 AM Performed by: Inda Coke, CRNA, CRNA  Patient location: Pre-op. Preanesthetic checklist: patient identified, IV checked, site marked, risks and benefits discussed, surgical consent, monitors and equipment checked, pre-op evaluation, timeout performed and anesthesia consent Lidocaine 1% used for infiltration Right, radial was placed Catheter size: 20 G Hand hygiene performed  and maximum sterile barriers used  Allen's test indicative of satisfactory collateral circulation Attempts: 1 Procedure performed without using ultrasound guided technique. Following insertion, dressing applied and Biopatch. Post procedure assessment: normal  Patient tolerated the procedure well with no immediate complications.

## 2020-12-16 NOTE — Transfer of Care (Signed)
Immediate Anesthesia Transfer of Care Note  Patient: Bobby [name]  Procedure(s) Performed: XI ROBOTIC ASSISTED THORASCOPY-LEFT LOWER LOBECTOMY (Left Chest)  Patient Location: PACU  Anesthesia Type:General  Level of Consciousness: drowsy and patient cooperative  Airway & Oxygen Therapy: Patient Spontanous Breathing and Patient connected to face mask oxygen  Post-op Assessment: Report given to RN, Post -op Vital signs reviewed and stable and Patient moving all extremities  Post vital signs: Reviewed and stable  Last Vitals:  Vitals Value Taken Time  BP 116/61 12/16/20 1200  Temp    Pulse 65 12/16/20 1203  Resp 17 12/16/20 1203  SpO2 100 % 12/16/20 1203  Vitals shown include unvalidated device data.  Last Pain:  Vitals:   12/16/20 0621  TempSrc: Tympanic  PainSc: 0-No pain         Complications: No complications documented.

## 2020-12-16 NOTE — Progress Notes (Signed)
Admission from PACU by bed sleepy but arousal.

## 2020-12-16 NOTE — Progress Notes (Signed)
Kept taking out BP cuff despite explaining the needs to take v/s regularly.

## 2020-12-16 NOTE — Discharge Summary (Addendum)
Physician Discharge Summary  Patient ID: Bobby [name] MRN: [id number] DOB/AGE: 1946-11-05 75 y.o.  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 Remote history of tobacco use History of bladder cancer Hypertension Benign prostatic hyperplasia Type 2 diabetes mellitus  Discharge Diagnoses:   History of lung cancer, status post right upper lobectomy Adenocarcinoma left lower lobe (T3N0) Remote history of tobacco use History of bladder cancer Hypertension Benign prostatic hyperplasia Type 2 diabetes mellitus S/P left lower lobectomy of lung with mediastinal lymph node dissection   Discharged Condition: stable  Bobby [name] is a 75 year old man with a history of 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  He had a right upper lobectomy by Dr. Arlyce Dice in 2000 for a low-grade adenocarcinoma.  He now has 2 synchronous lesions in the left lower lobe so technically is T3, N0, M0, clinical stage IIb.  He does have resectable disease.  He also has adequate pulmonary reserve to tolerate a resection.  We discussed the potential treatment options including stereotactic radiation and surgical resection.  Either these may be combined with chemotherapy given his T3 status.  However given the low-grade nature of the lesion surgical resection likely would be curative and of itself.  I discussed the proposed operation with him.  We would plan to do a robotic left VATS for left lower lobectomy.  As part of that we would do a lymph node dissection.  I informed him of the general nature of the procedure including the need for general anesthesia, the incisions to be used, the use of a drainage tube postoperatively,  the expected hospital stay, and the overall recovery.  He understands this will have a relatively greater impact on his respiratory reserve than his previous lobectomy.  I informed him of the indications, risks, benefits, and alternatives.  He understands the risks include, but not limited to death, MI, DVT, PE, bleeding, possible need for transfusion, infection, prolonged air leak, cardiac arrhythmias, as well as the possibility of other unforeseeable complications.  Bobby [name] strongly favors proceeding with surgical resection.   Hospital Course:   Bobby [name] was admitted to the hospital for elective surgery on 12/16/2020.  He was taken to the operating room where robotic assisted left lower lobectomy was carried out without complication.  Following the procedure, he was extubated without difficulty and transferred to the postanesthesia care unit and later  To  2C progressive care.  His respiratory status remained stable.  His chest tube remained to waterseal. His chest tube was removed on 12/20/2019. Follow-up CXR showed no PTX and some mild expected atelectasis. Today, he is stable for discharge home.   Consults: None  Significant Diagnostic Studies:  CLINICAL DATA:  Non-small-cell lung cancer. Adenocarcinoma diagnosed in May of 2010. Right upper lobectomy in 2010. Currently on observation.  EXAM: CT CHEST WITH CONTRAST  TECHNIQUE: Multidetector CT imaging of the chest was performed during intravenous contrast administration.  CONTRAST:  63mL OMNIPAQUE IOHEXOL 300 MG/ML  SOLN  COMPARISON:  04/29/2020 PET.  04/13/2020 chest CT.  FINDINGS: Cardiovascular: Aortic atherosclerosis. Normal heart size, without pericardial effusion. No central pulmonary embolism, on this non-dedicated study.  Mediastinum/Nodes: Mildly prominent right axillary nodes are favored to be reactive and not pathologic by size criteria. No mediastinal or hilar adenopathy.  Lungs/Pleura: No  pleural  fluid. Mild right-sided pleural thickening is unchanged.  Right upper lobectomy.  Similar volume loss adjacent to surgical sutures, including on 74/7.  Mild centrilobular emphysema.  Mixed attenuation vague left lower lobe 8 mm nodule on 88/7 is similar to on the prior exam (when remeasured).  Just cephalad to this, 2 adjacent ill-defined nodules on the order of 4-7 mm are unchanged.  Superior segment left lower lobe sub solid pulmonary nodule measures 2.1 x 2.2 cm on 66/7. Compare 2.3 x 2.3 cm on the prior exam (when remeasured).  Superior segment left lower lobe ground-glass pulmonary nodule measures 1.1 cm on 27/7 and is similar to on the prior exam (when remeasured).  Upper Abdomen: Possible mild steatosis. Normal imaged portions of the spleen, stomach, pancreas, right adrenal gland, kidneys. Mild left adrenal thickening.  Musculoskeletal: No acute osseous abnormality. Lower thoracic and lower cervical spondylosis.  IMPRESSION: 1. Status post right upper lobectomy, without recurrent or metastatic disease. 2. Left lower lobe ground-glass nodules, including 2 dominant unchanged nodules as detailed above. These remain suspicious for indolent primary bronchogenic carcinoma, likely low-grade adenocarcinoma. 3.  Aortic Atherosclerosis (ICD10-I70.0).   Electronically Signed   By: Abigail Miyamoto M.D.   On: 11/01/2020 13:52   CLINICAL DATA:  Chest tube removal.  Shortness of breath.  EXAM: CHEST  1 VIEW  COMPARISON:  December 19, 2020  FINDINGS: There has been interval removal of the left-sided chest tube. There is no left-sided pneumothorax. There are persistent bilateral small pleural effusions with adjacent airspace disease favored to represent atelectasis. The heart size remains stable. There is no acute osseous abnormality. There is a small volume of subcutaneous gas along the patient's left flank.  IMPRESSION: 1. Interval removal of  left-sided chest tube without evidence for pneumothorax. 2. Persistent small bilateral pleural effusions with adjacent airspace disease, favored to represent atelectasis.   Electronically Signed   By: Constance Holster M.D.   On: 12/20/2020 05:58  Treatments:  NAME: HERSCHELL, VIRANI MEDICAL RECORD [id number] ACCOUNT [id number] DATE OF BIRTH:October 20, 1946 FACILITY: [facility] LOCATION: [facility] PHYSICIAN:Derrick Tiegs Chaya Jan, MD  OPERATIVE REPORT  DATE OF PROCEDURE:  12/16/2020  PREOPERATIVE DIAGNOSIS:  Adenocarcinoma of left lower lobe T3, N0.  POSTOPERATIVE DIAGNOSIS:  Adenocarcinoma of left lower lobe T3, N0.  PROCEDURE:  Xi Robotic-assisted left lower lobectomy, lymph node dissection, and intercostal nerve blocks at levels 3 through 10.  SURGEON:  Modesto Charon, MD  ASSISTANT:  Enid Cutter, PA-C  ANESTHESIA:  General.  FINDINGS:  Enlarged but otherwise normal appearing nodes.  Bronchial margin negative for tumor.   Discharge Exam: Blood pressure 137/79, pulse 72, temperature 98.4 F (36.9 C), temperature source Axillary, resp. rate 17, height 5\' 10"  (1.778 m), weight 104 kg, SpO2 98 %.  General appearance: alert, cooperative and no distress Neurologic: intact Heart: regular rate and rhythm Lungs: diminished breath sounds bibasilar Wounds; the port incisions and CT insertion site are dry and intact.   Disposition:    Allergies as of 12/20/2020   No Known Allergies     Medication List    TAKE these medications   allopurinol 100 MG tablet Commonly known as: ZYLOPRIM Take 100 mg by mouth daily.   amLODipine-benazepril 5-20 MG capsule Commonly known as: LOTREL Take 1 capsule by mouth every morning.   aspirin EC 81 MG tablet Take 81 mg by mouth every evening.   colchicine 0.6 MG tablet Take 0.6 mg by mouth daily.   fluticasone 50 MCG/ACT nasal spray Commonly known as: FLONASE  Place 1 spray into both nostrils daily as needed for  allergies.   glyBURIDE 5 MG tablet Commonly known as: DIABETA Take 5 mg by mouth daily.   oxyCODONE-acetaminophen 5-325 MG tablet Commonly known as: Percocet Take 1 tablet by mouth every 4 (four) hours as needed for severe pain.   simvastatin 20 MG tablet Commonly known as: ZOCOR Take 20 mg by mouth every evening.   sitaGLIPtin-metformin 50-500 MG tablet Commonly known as: JANUMET Take 1 tablet by mouth 2 (two) times daily with a meal.   Vitamin D3 50 MCG (2000 UT) Tabs Take 2,000 Units by mouth daily.       Follow-up Information    Melrose Nakayama, MD. Go on 01/11/2021.   Specialty: Cardiothoracic Surgery Why: Your appointment is on Tuesday, January 11, 2021 at 12 noon.  Please arrive 30 minutes early for a chest x-ray to be performed by [facility] located on the first floor of the same building.  Contact information: [name] [place] [address] [phone]               Signed: Antony Odea , PA-C 12/20/2020, 9:50 AM

## 2020-12-16 NOTE — Anesthesia Procedure Notes (Signed)
Procedure Name: Intubation Date/Time: 12/16/2020 8:17 AM Performed by: Inda Coke, CRNA Pre-anesthesia Checklist: Patient identified, Emergency Drugs available, Suction available and Patient being monitored Patient Re-evaluated:Patient Re-evaluated prior to induction Oxygen Delivery Method: Circle System Utilized Preoxygenation: Pre-oxygenation with 100% oxygen Induction Type: IV induction Ventilation: Mask ventilation without difficulty and Oral airway inserted - appropriate to patient size Laryngoscope Size: Mac and 4 Grade View: Grade I Tube type: Oral Endobronchial tube: Left, Double lumen EBT, EBT position confirmed by auscultation and EBT position confirmed by fiberoptic bronchoscope and 39 Fr Number of attempts: 1 Airway Equipment and Method: Stylet and Oral airway Placement Confirmation: ETT inserted through vocal cords under direct vision,  positive ETCO2 and breath sounds checked- equal and bilateral Secured at: 29 cm Tube secured with: Tape Dental Injury: Teeth and Oropharynx as per pre-operative assessment

## 2020-12-17 ENCOUNTER — Inpatient Hospital Stay (HOSPITAL_COMMUNITY): Payer: Medicare PPO

## 2020-12-17 ENCOUNTER — Encounter (HOSPITAL_COMMUNITY): Payer: Self-pay | Admitting: Thoracic Surgery (Cardiothoracic Vascular Surgery)

## 2020-12-17 LAB — GLUCOSE, CAPILLARY
Glucose-Capillary: 131 mg/dL — ABNORMAL HIGH (ref 70–99)
Glucose-Capillary: 129 mg/dL — ABNORMAL HIGH (ref 70–99)
Glucose-Capillary: 122 mg/dL — ABNORMAL HIGH (ref 70–99)
Glucose-Capillary: 144 mg/dL — ABNORMAL HIGH (ref 70–99)

## 2020-12-17 LAB — BASIC METABOLIC PANEL
Anion gap: 11 (ref 5–15)
BUN: 14 mg/dL (ref 8–23)
CO2: 23 mmol/L (ref 22–32)
Calcium: 8.9 mg/dL (ref 8.9–10.3)
Chloride: 104 mmol/L (ref 98–111)
Creatinine, Ser: 0.99 mg/dL (ref 0.61–1.24)
GFR, Estimated: >60 mL/min (ref >60)
Glucose, Bld: 150 mg/dL — ABNORMAL HIGH (ref 70–99)
Potassium: 3.8 mmol/L (ref 3.5–5.1)
Sodium: 138 mmol/L (ref 135–145)

## 2020-12-17 LAB — CBC
HCT: 38.6 % — ABNORMAL LOW (ref 39.0–52.0)
Hemoglobin: 12.9 g/dL — ABNORMAL LOW (ref 13.0–17.0)
MCH: 27.6 pg (ref 26.0–34.0)
MCHC: 33.4 g/dL (ref 30.0–36.0)
MCV: 82.7 fL (ref 80.0–100.0)
Platelets: 246 10*3/uL (ref 150–400)
RBC: 4.67 MIL/uL (ref 4.22–5.81)
RDW: 15.1 % (ref 11.5–15.5)
WBC: 13.9 10*3/uL — ABNORMAL HIGH (ref 4.0–10.5)
nRBC: 0 % (ref 0.0–0.2)

## 2020-12-17 MED ORDER — KETOROLAC TROMETHAMINE 15 MG/ML IJ SOLN
15.0000 mg | Freq: Four times a day (QID) | INTRAMUSCULAR | Status: AC
  Administered 2020-12-17 – 2020-12-19 (×8): 15 mg via INTRAVENOUS
  Filled 2020-12-17 (×9): qty 1

## 2020-12-17 NOTE — Anesthesia Postprocedure Evaluation (Signed)
Anesthesia Post Note  Patient: Bobby [name]  Procedure(s) Performed: XI ROBOTIC ASSISTED THORASCOPY-LEFT LOWER LOBECTOMY (Left Chest) LYMPH NODE DISSECTION, left lung (Left Chest) INTERCOSTAL NERVE BLOCK (Left Chest)     Patient location during evaluation: PACU Anesthesia Type: General Level of consciousness: awake and alert Pain management: pain level controlled Vital Signs Assessment: post-procedure vital signs reviewed and stable Respiratory status: spontaneous breathing, nonlabored ventilation, respiratory function stable and patient connected to nasal cannula oxygen Cardiovascular status: blood pressure returned to baseline and stable Postop Assessment: no apparent nausea or vomiting Anesthetic complications: no   No complications documented.  Last Vitals:  Vitals:   12/16/20 2330 12/17/20 0239  BP: 108/68 121/73  Pulse: 76 72  Resp: 15 18  Temp: 37 C 36.8 C  SpO2: 98% 98%    Last Pain:  Vitals:   12/17/20 0239  TempSrc: Oral  PainSc: 0-No pain                 Margorie Renner S

## 2020-12-17 NOTE — Progress Notes (Addendum)
      [address]       ,[place] [address]             [phone]      1 Day Post-Op Procedure(s) (LRB): XI ROBOTIC ASSISTED THORASCOPY-LEFT LOWER LOBECTOMY (Left) LYMPH NODE DISSECTION, left lung (Left) INTERCOSTAL NERVE BLOCK (Left) Subjective: Eating breakfast, still having some pain in his neck and shoulders.   Objective: Vital signs in last 24 hours: Temp:  [97.8 F (36.6 C)-98.8 F (37.1 C)] 98.2 F (36.8 C) (01/14 0239) Pulse Rate:  [63-77] 72 (01/14 0239) Cardiac Rhythm: Normal sinus rhythm (01/14 0239) Resp:  [11-18] 18 (01/14 0239) BP: (106-124)/(61-77) 121/73 (01/14 0239) SpO2:  [93 %-100 %] 98 % (01/14 0239) Arterial Line BP: (124-145)/(60-69) 145/66 (01/13 1314)     Intake/Output from previous day: 01/13 0701 - 01/14 0700 In: 3029.6 [P.O.:720; I.V.:1909.6; IV Piggyback:400] Out: 2520 [Urine:2275; Blood:75; Chest Tube:170] Intake/Output this shift: No intake/output data recorded.  General appearance: alert, cooperative and no distress Heart: regular rate and rhythm, S1, S2 normal, no murmur, click, rub or gallop Lungs: clear to auscultation bilaterally Abdomen: soft, non-tender; bowel sounds normal; no masses,  no organomegaly Extremities: extremities normal, atraumatic, no cyanosis or edema Wound: clean and dry  Lab Results: Recent Labs    12/14/20 1419 12/16/20 0904 12/17/20 0205  WBC 8.2  --  13.9*  HGB 14.2 14.3 12.9*  HCT 45.6 42.0 38.6*  PLT 243  --  246   BMET:  Recent Labs    12/14/20 1419 12/16/20 0904 12/17/20 0205  NA 140 140 138  K 3.8 3.7 3.8  CL 108  --  104  CO2 20*  --  23  GLUCOSE 79  --  150*  BUN 11  --  14  CREATININE 0.89  --  0.99  CALCIUM 9.3  --  8.9    PT/INR:  Recent Labs    12/14/20 1419  LABPROT 12.7  INR 1.0   ABG    Component Value Date/Time   PHART 7.385 12/16/2020 0904   HCO3 27.5 12/16/2020 0904   TCO2 29 12/16/2020 0904   ACIDBASEDEF 1.4 12/14/2020 1436   O2SAT 100.0  12/16/2020 0904   CBG (last 3)  Recent Labs    12/16/20 1643 12/16/20 2109 12/17/20 0606  GLUCAP 184* 168* 131*    Assessment/Plan: S/P Procedure(s) (LRB): XI ROBOTIC ASSISTED THORASCOPY-LEFT LOWER LOBECTOMY (Left) LYMPH NODE DISSECTION, left lung (Left) INTERCOSTAL NERVE BLOCK (Left)  1. CXR appears stable without pneumothorax. Right basilar atelectasis vs. Small pleural effusion.  2. Chest tube drainage 170cc/since surgery, no air leak, + fluid wave, on water seal 3. BP well controlled, NSR in the 70s 4. Tolerating room air with good oxygen saturation 5. H and H stable 6. Continue lovenox for DVT prophylaxis 7. Pain in his neck and shoulders likely from positioning. Will order kpad. He also has morphine available.  Plan: Discontinue IV fluids, foley catheter. Encouraged him to ambulate several times today. Possibly remove chest tube today vs tomorrow. Doing well POD1     LOS: 1 day    Elgie Collard 12/17/2020 Patient seen and examined. He had a very small air leak when I examined him, so will leave tube in today [place]. Roxan Hockey, MD [facility] [phone]

## 2020-12-17 NOTE — Plan of Care (Signed)
  Problem: Education: Goal: Knowledge of disease or condition will improve Outcome: Progressing Goal: Knowledge of the prescribed therapeutic regimen will improve Outcome: Progressing   Problem: Activity: Goal: Risk for activity intolerance will decrease Outcome: Progressing

## 2020-12-18 ENCOUNTER — Inpatient Hospital Stay (HOSPITAL_COMMUNITY): Payer: Medicare PPO

## 2020-12-18 LAB — GLUCOSE, CAPILLARY
Glucose-Capillary: 96 mg/dL (ref 70–99)
Glucose-Capillary: 142 mg/dL — ABNORMAL HIGH (ref 70–99)
Glucose-Capillary: 127 mg/dL — ABNORMAL HIGH (ref 70–99)
Glucose-Capillary: 148 mg/dL — ABNORMAL HIGH (ref 70–99)

## 2020-12-18 LAB — CBC
HCT: 37.9 % — ABNORMAL LOW (ref 39.0–52.0)
Hemoglobin: 12.4 g/dL — ABNORMAL LOW (ref 13.0–17.0)
MCH: 27.3 pg (ref 26.0–34.0)
MCHC: 32.7 g/dL (ref 30.0–36.0)
MCV: 83.3 fL (ref 80.0–100.0)
Platelets: 221 10*3/uL (ref 150–400)
RBC: 4.55 MIL/uL (ref 4.22–5.81)
RDW: 15.4 % (ref 11.5–15.5)
WBC: 11.4 10*3/uL — ABNORMAL HIGH (ref 4.0–10.5)
nRBC: 0 % (ref 0.0–0.2)

## 2020-12-18 LAB — COMPREHENSIVE METABOLIC PANEL
ALT: 27 U/L (ref 0–44)
AST: 23 U/L (ref 15–41)
Albumin: 2.9 g/dL — ABNORMAL LOW (ref 3.5–5.0)
Alkaline Phosphatase: 42 U/L (ref 38–126)
Anion gap: 8 (ref 5–15)
BUN: 14 mg/dL (ref 8–23)
CO2: 24 mmol/L (ref 22–32)
Calcium: 8.4 mg/dL — ABNORMAL LOW (ref 8.9–10.3)
Chloride: 106 mmol/L (ref 98–111)
Creatinine, Ser: 0.96 mg/dL (ref 0.61–1.24)
GFR, Estimated: >60 mL/min (ref >60)
Glucose, Bld: 106 mg/dL — ABNORMAL HIGH (ref 70–99)
Potassium: 3.6 mmol/L (ref 3.5–5.1)
Sodium: 138 mmol/L (ref 135–145)
Total Bilirubin: 0.4 mg/dL (ref 0.3–1.2)
Total Protein: 5.7 g/dL — ABNORMAL LOW (ref 6.5–8.1)

## 2020-12-18 MED ORDER — POTASSIUM CHLORIDE CRYS ER 20 MEQ PO TBCR
20.0000 meq | EXTENDED_RELEASE_TABLET | ORAL | Status: AC
  Administered 2020-12-18 (×3): 20 meq via ORAL
  Filled 2020-12-18 (×3): qty 1

## 2020-12-18 NOTE — Plan of Care (Signed)
  Problem: Education: Goal: Knowledge of disease or condition will improve Outcome: Completed/Met Goal: Knowledge of the prescribed therapeutic regimen will improve Outcome: Completed/Met   Problem: Activity: Goal: Risk for activity intolerance will decrease Outcome: Completed/Met   Problem: Pain Management: Goal: Pain level will decrease Outcome: Progressing

## 2020-12-18 NOTE — Progress Notes (Addendum)
      [address]       [place] [address]             [phone]      2 Days Post-Op Procedure(s) (LRB): XI ROBOTIC ASSISTED THORASCOPY-LEFT LOWER LOBECTOMY (Left) LYMPH NODE DISSECTION, left lung (Left) INTERCOSTAL NERVE BLOCK (Left) Subjective: Feels okay this morning.   Objective: Vital signs in last 24 hours: Temp:  [97.8 F (36.6 C)-99.1 F (37.3 C)] 99 F (37.2 C) (01/15 0741) Pulse Rate:  [65-77] 74 (01/15 0741) Cardiac Rhythm: Normal sinus rhythm (01/14 2000) Resp:  [16-22] 19 (01/15 0741) BP: (114-142)/(63-84) 114/69 (01/15 0741) SpO2:  [98 %-100 %] 98 % (01/15 0741)     Intake/Output from previous day: 01/14 0701 - 01/15 0700 In: 600 [P.O.:600] Out: 1910 [Urine:1650; Chest Tube:260] Intake/Output this shift: Total I/O In: 240 [P.O.:240] Out: 400 [Urine:400]  General appearance: alert, cooperative and no distress Heart: regular rate and rhythm, S1, S2 normal, no murmur, click, rub or gallop Lungs: clear to auscultation bilaterally Abdomen: soft, non-tender; bowel sounds normal; no masses,  no organomegaly Extremities: extremities normal, atraumatic, no cyanosis or edema Wound: clean and dry  Lab Results: Recent Labs    12/17/20 0205 12/18/20 0025  WBC 13.9* 11.4*  HGB 12.9* 12.4*  HCT 38.6* 37.9*  PLT 246 221   BMET:  Recent Labs    12/17/20 0205 12/18/20 0025  NA 138 138  K 3.8 3.6  CL 104 106  CO2 23 24  GLUCOSE 150* 106*  BUN 14 14  CREATININE 0.99 0.96  CALCIUM 8.9 8.4*    PT/INR: No results for input(s): LABPROT, INR in the last 72 hours. ABG    Component Value Date/Time   PHART 7.385 12/16/2020 0904   HCO3 27.5 12/16/2020 0904   TCO2 29 12/16/2020 0904   ACIDBASEDEF 1.4 12/14/2020 1436   O2SAT 100.0 12/16/2020 0904   CBG (last 3)  Recent Labs    12/17/20 1631 12/17/20 2137 12/18/20 0631  GLUCAP 122* 144* 96    Assessment/Plan: S/P Procedure(s) (LRB): XI ROBOTIC ASSISTED THORASCOPY-LEFT LOWER  LOBECTOMY (Left) LYMPH NODE DISSECTION, left lung (Left) INTERCOSTAL NERVE BLOCK (Left)  1. CXR appears stable without pneumothorax. Right basilar atelectasis vs. Small pleural effusion.  2. Chest tube drainage 260cc/24 hours, + air leak, + fluid wave, on water seal 3. BP well controlled, NSR in the 70s 4. Tolerating room air with good oxygen saturation 5. H and H stable 6. Continue lovenox for DVT prophylaxis 7. Pain in his neck and shoulders likely from positioning. Will order kpad. He also has morphine available.  Plan: There is still a small air leak. Waiting on CXR to result. Continue to work on Chirop[employer].    LOS: 2 days    Bobby [name] 12/18/2020   I have seen and examined the patient and agree with the assessment and plan as outlined.  Bobby Alberts, MD 12/18/2020 12:21 PM

## 2020-12-19 ENCOUNTER — Inpatient Hospital Stay (HOSPITAL_COMMUNITY): Payer: Medicare PPO

## 2020-12-19 LAB — GLUCOSE, CAPILLARY
Glucose-Capillary: 98 mg/dL (ref 70–99)
Glucose-Capillary: 110 mg/dL — ABNORMAL HIGH (ref 70–99)
Glucose-Capillary: 139 mg/dL — ABNORMAL HIGH (ref 70–99)
Glucose-Capillary: 136 mg/dL — ABNORMAL HIGH (ref 70–99)

## 2020-12-19 NOTE — Plan of Care (Signed)
  Problem: Cardiac: Goal: Will achieve and/or maintain hemodynamic stability Outcome: Progressing   Problem: Clinical Measurements: Goal: Postoperative complications will be avoided or minimized Outcome: Progressing   Problem: Respiratory: Goal: Respiratory status will improve Outcome: Progressing   Problem: Pain Management: Goal: Pain level will decrease Outcome: Progressing   Problem: Skin Integrity: Goal: Wound healing without signs and symptoms infection will improve Outcome: Progressing

## 2020-12-19 NOTE — Progress Notes (Addendum)
      [address]       [place] [address]             [phone]      3 Days Post-Op Procedure(s) (LRB): XI ROBOTIC ASSISTED THORASCOPY-LEFT LOWER LOBECTOMY (Left) LYMPH NODE DISSECTION, left lung (Left) INTERCOSTAL NERVE BLOCK (Left) Subjective: Feels okay this morning. No complaints.   Objective: Vital signs in last 24 hours: Temp:  [98.2 F (36.8 C)-98.9 F (37.2 C)] 98.3 F (36.8 C) (01/16 0734) Pulse Rate:  [61-72] 71 (01/16 0734) Cardiac Rhythm: Normal sinus rhythm (01/16 0714) Resp:  [15-23] 19 (01/16 0734) BP: (117-144)/(69-82) 144/81 (01/16 0734) SpO2:  [95 %-99 %] 95 % (01/16 0734)     Intake/Output from previous day: 01/15 0701 - 01/16 0700 In: 840 [P.O.:840] Out: 1120 [Urine:850; Chest Tube:270] Intake/Output this shift: No intake/output data recorded.  General appearance: alert, cooperative and no distress Heart: regular rate and rhythm, S1, S2 normal, no murmur, click, rub or gallop Lungs: clear to auscultation bilaterally Abdomen: soft, non-tender; bowel sounds normal; no masses,  no organomegaly Extremities: extremities normal, atraumatic, no cyanosis or edema Wound: clean and dry  Lab Results: Recent Labs    12/17/20 0205 12/18/20 0025  WBC 13.9* 11.4*  HGB 12.9* 12.4*  HCT 38.6* 37.9*  PLT 246 221   BMET:  Recent Labs    12/17/20 0205 12/18/20 0025  NA 138 138  K 3.8 3.6  CL 104 106  CO2 23 24  GLUCOSE 150* 106*  BUN 14 14  CREATININE 0.99 0.96  CALCIUM 8.9 8.4*    PT/INR: No results for input(s): LABPROT, INR in the last 72 hours. ABG    Component Value Date/Time   PHART 7.385 12/16/2020 0904   HCO3 27.5 12/16/2020 0904   TCO2 29 12/16/2020 0904   ACIDBASEDEF 1.4 12/14/2020 1436   O2SAT 100.0 12/16/2020 0904   CBG (last 3)  Recent Labs    12/18/20 1616 12/18/20 2114 12/19/20 0614  GLUCAP 127* 148* 98    Assessment/Plan: S/P Procedure(s) (LRB): XI ROBOTIC ASSISTED THORASCOPY-LEFT LOWER  LOBECTOMY (Left) LYMPH NODE DISSECTION, left lung (Left) INTERCOSTAL NERVE BLOCK (Left)  1. CXR this morning showed: persistent bibasilar opacities without worsening of left opacity, no pneumo 2. Chest tube drainage: 270cc/24 hours. Still with intermittent air leak but improved from yesterday 3. NSR in the 70s, BP stable 4. Tolerating room air with good oxygen saturation 5. H and H stable 6. Continue lovenox for DVT prophylaxis  Plan: Patient is doing well today. Walked in the halls x 1 yesterday. + fluid wave and intermittent air leak. Will discuss removal with Dr. Roxy Manns     LOS: 3 days    Elgie Collard 12/19/2020    I have seen and examined the patient and agree with the assessment and plan as outlined.  D/C tube.  Possible D/C home tomorrow  Rexene Alberts, MD 12/19/2020 10:58 AM

## 2020-12-19 NOTE — Progress Notes (Signed)
Chest tube removed per order, patient tolerated well, site is clean dry and intact.

## 2020-12-20 ENCOUNTER — Inpatient Hospital Stay (HOSPITAL_COMMUNITY): Payer: Medicare PPO

## 2020-12-20 LAB — GLUCOSE, CAPILLARY: Glucose-Capillary: 138 mg/dL — ABNORMAL HIGH (ref 70–99)

## 2020-12-20 MED ORDER — OXYCODONE-ACETAMINOPHEN 5-325 MG PO TABS
1.0000 | ORAL_TABLET | ORAL | 0 refills | Status: DC | PRN
Start: 2020-12-20 — End: 2021-03-15

## 2020-12-20 NOTE — Plan of Care (Signed)
  Problem: Cardiac: Goal: Will achieve and/or maintain hemodynamic stability Outcome: Adequate for Discharge   Problem: Clinical Measurements: Goal: Postoperative complications will be avoided or minimized Outcome: Adequate for Discharge   Problem: Respiratory: Goal: Respiratory status will improve Outcome: Adequate for Discharge   Problem: Pain Management: Goal: Pain level will decrease Outcome: Adequate for Discharge   Problem: Skin Integrity: Goal: Wound healing without signs and symptoms infection will improve Outcome: Adequate for Discharge
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
## 2020-12-20 NOTE — Care Management Important Message (Signed)
Important Message  Patient Details  Name: KOHLTON GILPATRICK MRN: [id number] Date of Birth: 01-Jan-1946   Medicare Important Message Given:  Yes - Important Message mailed due to current National Emergency  Verbal consent obtained due to current National Emergency  Relationship to patient: Self Contact Name: Selah Zelman Call Date: 12/20/20  Time: [place] Phone: [phone] Outcome: No Answer/Busy       Lael Pilch P Jonte Shiller 12/20/2020, 1:45 PM

## 2020-12-20 NOTE — Progress Notes (Signed)
Discharge instructions given to patient. Patient understands follow up appointments and where to pick up new medication. Patient discharging with son to home.

## 2020-12-27 ENCOUNTER — Encounter: Payer: Self-pay | Admitting: *Deleted

## 2020-12-27 NOTE — Progress Notes (Signed)
I contacted Dr. Julien Nordmann regarding Mr. Mckeone follow up.  He states ok for him to be seen in April as scheduled.

## 2020-12-28 LAB — SURGICAL PATHOLOGY

## 2021-01-04 DIAGNOSIS — C801 Malignant (primary) neoplasm, unspecified: Secondary | ICD-10-CM | POA: Diagnosis not present

## 2021-01-04 DIAGNOSIS — Z7984 Long term (current) use of oral hypoglycemic drugs: Secondary | ICD-10-CM | POA: Diagnosis not present

## 2021-01-04 DIAGNOSIS — E1169 Type 2 diabetes mellitus with other specified complication: Secondary | ICD-10-CM | POA: Diagnosis not present

## 2021-01-04 DIAGNOSIS — I10 Essential (primary) hypertension: Secondary | ICD-10-CM | POA: Diagnosis not present

## 2021-01-04 DIAGNOSIS — E7800 Pure hypercholesterolemia, unspecified: Secondary | ICD-10-CM | POA: Diagnosis not present

## 2021-01-04 DIAGNOSIS — I700 Atherosclerosis of aorta: Secondary | ICD-10-CM | POA: Diagnosis not present

## 2021-01-04 DIAGNOSIS — C3432 Malignant neoplasm of lower lobe, left bronchus or lung: Secondary | ICD-10-CM | POA: Diagnosis not present

## 2021-01-04 DIAGNOSIS — M109 Gout, unspecified: Secondary | ICD-10-CM | POA: Diagnosis not present

## 2021-01-04 DIAGNOSIS — Z0000 Encounter for general adult medical examination without abnormal findings: Secondary | ICD-10-CM | POA: Diagnosis not present

## 2021-01-06 ENCOUNTER — Other Ambulatory Visit: Payer: Self-pay | Admitting: *Deleted

## 2021-01-06 NOTE — Progress Notes (Signed)
The proposed treatment discussed in cancer conference is for discussion purpose only and is not a binding recommendation. The patient was not physically examined nor present for their treatment options. Therefore, final treatment plans cannot be decided.  ?

## 2021-01-10 ENCOUNTER — Other Ambulatory Visit: Payer: Self-pay | Admitting: Thoracic Surgery (Cardiothoracic Vascular Surgery)

## 2021-01-10 DIAGNOSIS — C3411 Malignant neoplasm of upper lobe, right bronchus or lung: Secondary | ICD-10-CM

## 2021-01-11 ENCOUNTER — Ambulatory Visit
Admission: RE | Admit: 2021-01-11 | Discharge: 2021-01-11 | Disposition: A | Discharge status: Home / Self Care | Payer: Medicare PPO | Source: Ambulatory Visit | Attending: Thoracic Surgery (Cardiothoracic Vascular Surgery) | Admitting: Thoracic Surgery (Cardiothoracic Vascular Surgery)

## 2021-01-11 ENCOUNTER — Ambulatory Visit (INDEPENDENT_AMBULATORY_CARE_PROVIDER_SITE_OTHER): Payer: Self-pay | Admitting: Thoracic Surgery (Cardiothoracic Vascular Surgery)

## 2021-01-11 ENCOUNTER — Other Ambulatory Visit: Payer: Self-pay

## 2021-01-11 ENCOUNTER — Encounter: Payer: Self-pay | Admitting: Thoracic Surgery (Cardiothoracic Vascular Surgery)

## 2021-01-11 VITALS — BP 160/83 | HR 88 | Temp 98.1°F | Resp 20 | Ht 70.0 in | Wt 223.0 lb

## 2021-01-11 DIAGNOSIS — C3411 Malignant neoplasm of upper lobe, right bronchus or lung: Secondary | ICD-10-CM

## 2021-01-11 DIAGNOSIS — J90 Pleural effusion, not elsewhere classified: Secondary | ICD-10-CM | POA: Diagnosis not present

## 2021-01-11 DIAGNOSIS — Z902 Acquired absence of lung [part of]: Secondary | ICD-10-CM

## 2021-01-11 DIAGNOSIS — Z9889 Other specified postprocedural states: Secondary | ICD-10-CM

## 2021-01-11 NOTE — Progress Notes (Signed)
[address]       [place],B[place] [name] [name]             [phone]      HPI: 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  He has had 2 groundglass nodules in the left lower lobe that have been followed over time and slowly enlarged.  Navigational bronchoscopy and biopsy showed adenocarcinoma at both sites.  I did a robotic left lower lobectomy on 12/16/2020.  All the nodes were negative.  He feels well.  He does have a little soreness but is not taking any medication for that.  He has not had any respiratory issues.  He says he is a little nervous about his visit today.  Past Medical History:  Diagnosis Date  . Arthritis    Right wrist  . BPH (benign prostatic hypertrophy)   . COPD (chronic obstructive pulmonary disease) (HCC)    emphysema  . Frequency of urination   . History of lung cancer    STAGE 1A NON-SMALL CELL LUNG CARCINOMA ---  04-10-2009   S/P RIGHT UPPER LOBECTOMY , NO CHEMORADIATION--  NO RECURRENCE  (ONCOLOGIST-- DR [facility])  . Hyperlipidemia   . Hypertension   . nscl ca dx'd 04/2009   lung  . Pulmonary nodule seen on imaging study    STABLE LEFT LOWER LOBE NODULE  PER CHEST CT--  MONITORED BY DR [facility]  . Sleep apnea    OSA does not use CPAP  . Type 2 diabetes mellitus ([place])   . Urgency of urination     Current Outpatient Medications  Medication Sig Dispense Refill  . allopurinol (ZYLOPRIM) 100 MG tablet Take 100 mg by mouth daily.    Marland Kitchen amLODipine-benazepril (LOTREL) 5-20 MG per capsule Take 1 capsule by mouth every morning.     Marland Kitchen aspirin EC 81 MG tablet Take 81 mg by mouth every evening.    . Cholecalciferol (VITAMIN D3) 50 MCG (2000 UT) TABS Take 2,000 Units by mouth daily.    . colchicine 0.6  MG tablet Take 0.6 mg by mouth daily.    . fluticasone (FLONASE) 50 MCG/ACT nasal spray Place 1 spray into both nostrils daily as needed for allergies.    Marland Kitchen glyBURIDE (DIABETA) 5 MG tablet Take 5 mg by mouth daily.    Marland Kitchen oxyCODONE-acetaminophen (PERCOCET) 5-325 MG tablet Take 1 tablet by mouth every 4 (four) hours as needed for severe pain. 20 tablet 0  . simvastatin (ZOCOR) 20 MG tablet Take 20 mg by mouth every evening.  1  . sitaGLIPtan-metformin (JANUMET) 50-500 MG per tablet Take 1 tablet by mouth 2 (two) times daily with a meal.     No current facility-administered medications for this visit.    Physical Exam BP (!) 160/83 (BP Location: Right Arm, Patient Position: Sitting)   Pulse 88   Temp 98.1 F (36.7 C)   Resp 20   Ht 5\' 10"  (1.778 m)   Wt 223 lb (101.2 kg)   SpO2 95% Comment: RA with mask on  BMI 32.27 kg/m  75 year old man in no acute distress Alert and oriented x3 no focal deficits Lungs diminished breath sounds at bases bilaterally, otherwise clear Cardiac regular rate and rhythm Incisions clean dry intact No peripheral  edema  Diagnostic Tests: CHEST - 2 VIEW  COMPARISON:  December 20, 2020.  FINDINGS: There is airspace opacity in the left base. There are small pleural effusions bilaterally. There is atelectatic change in the right base. Postoperative change noted on the right. Heart is upper normal in size with pulmonary vascularity within normal limits. No adenopathy. No appreciable bone lesions.  IMPRESSION: Opacity left lower lobe concerning for pneumonia. Small left pleural effusion. Small right pleural effusion with right base atelectasis. Postoperative change on the right. Stable cardiac silhouette.  Followup PA and lateral chest radiographs recommended in 3-4 weeks following trial of antibiotic therapy to ensure resolution and exclude underlying malignancy.  These results will be called to the ordering clinician or representative by the  Radiologist Assistant, and communication documented in the PACS or [employer].   Electronically Signed   By: Lowella Grip III M.D.   On: 01/11/2021 11:44 I personally reviewed the chest x-ray images.  It showed postoperative changes from a left lower lobectomy.  I do not see any concerning findings.  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  He had 2 nodules in the left lower lobe.  There was a larger central nodule and then a smaller nodule in the superior segment.  These both were groundglass opacities.  They slowly grew over time and then on PET there was a little bit of activity in the larger nodule.  Navigational bronchoscopy showed that both were adenocarcinomas.  I did a robotic left lower lobectomy on 12/17/2019.  His postoperative course was uncomplicated.  He went home on day 4.  He continues to do well.  There are no restrictions on his activities, but he was advised to build into new activities gradually.  He needs a follow-up appointment with Dr. Julien Nordmann.  Plan: Follow-up with Dr. Julien Nordmann Return in 2 months with PA lateral chest x-ray  Melrose Nakayama, MD [facility] [phone]

## 2021-02-28 ENCOUNTER — Telehealth: Payer: Self-pay | Admitting: Internal Medicine

## 2021-02-28 NOTE — Telephone Encounter (Signed)
R/s appt per 3/24 sch msg. Called pt, no answer. Left msg with appt date and time.

## 2021-03-03 ENCOUNTER — Encounter (HOSPITAL_COMMUNITY): Payer: Self-pay

## 2021-03-03 ENCOUNTER — Inpatient Hospital Stay: Payer: Medicare PPO | Attending: Internal Medicine

## 2021-03-03 ENCOUNTER — Other Ambulatory Visit: Payer: Self-pay

## 2021-03-03 ENCOUNTER — Ambulatory Visit (HOSPITAL_COMMUNITY)
Admission: RE | Admit: 2021-03-03 | Discharge: 2021-03-03 | Disposition: A | Discharge status: Home / Self Care | Payer: Medicare PPO | Source: Ambulatory Visit | Attending: Internal Medicine | Admitting: Internal Medicine

## 2021-03-03 DIAGNOSIS — C3490 Malignant neoplasm of unspecified part of unspecified bronchus or lung: Secondary | ICD-10-CM

## 2021-03-03 DIAGNOSIS — Z85118 Personal history of other malignant neoplasm of bronchus and lung: Secondary | ICD-10-CM | POA: Diagnosis not present

## 2021-03-03 DIAGNOSIS — Z8551 Personal history of malignant neoplasm of bladder: Secondary | ICD-10-CM | POA: Diagnosis not present

## 2021-03-03 DIAGNOSIS — J432 Centrilobular emphysema: Secondary | ICD-10-CM | POA: Diagnosis not present

## 2021-03-03 LAB — CBC WITH DIFFERENTIAL (CANCER CENTER ONLY)
Abs Immature Granulocytes: 0.02 10*3/uL (ref 0.00–0.07)
Basophils Absolute: 0.1 10*3/uL (ref 0.0–0.1)
Basophils Relative: 1 %
Eosinophils Absolute: 0.1 10*3/uL (ref 0.0–0.5)
Eosinophils Relative: 1 %
HCT: 43.3 % (ref 39.0–52.0)
Hemoglobin: 13.7 g/dL (ref 13.0–17.0)
Immature Granulocytes: 0 %
Lymphocytes Relative: 28 %
Lymphs Abs: 2 10*3/uL (ref 0.7–4.0)
MCH: 26.3 pg (ref 26.0–34.0)
MCHC: 31.6 g/dL (ref 30.0–36.0)
MCV: 83.1 fL (ref 80.0–100.0)
Monocytes Absolute: 0.6 10*3/uL (ref 0.1–1.0)
Monocytes Relative: 9 %
Neutro Abs: 4.3 10*3/uL (ref 1.7–7.7)
Neutrophils Relative %: 61 %
Platelet Count: 271 10*3/uL (ref 150–400)
RBC: 5.21 MIL/uL (ref 4.22–5.81)
RDW: 14.9 % (ref 11.5–15.5)
WBC Count: 7.1 10*3/uL (ref 4.0–10.5)
nRBC: 0 % (ref 0.0–0.2)

## 2021-03-03 LAB — CMP (CANCER CENTER ONLY)
ALT: 39 U/L (ref 0–44)
AST: 28 U/L (ref 15–41)
Albumin: 3.7 g/dL (ref 3.5–5.0)
Alkaline Phosphatase: 65 U/L (ref 38–126)
Anion gap: 12 (ref 5–15)
BUN: 17 mg/dL (ref 8–23)
CO2: 24 mmol/L (ref 22–32)
Calcium: 9 mg/dL (ref 8.9–10.3)
Chloride: 108 mmol/L (ref 98–111)
Creatinine: 0.99 mg/dL (ref 0.61–1.24)
GFR, Estimated: >60 mL/min (ref >60)
Glucose, Bld: 108 mg/dL — ABNORMAL HIGH (ref 70–99)
Potassium: 3.9 mmol/L (ref 3.5–5.1)
Sodium: 144 mmol/L (ref 135–145)
Total Bilirubin: 0.3 mg/dL (ref 0.3–1.2)
Total Protein: 7.1 g/dL (ref 6.5–8.1)

## 2021-03-03 MED ORDER — IOHEXOL 300 MG/ML  SOLN
75.0000 mL | Freq: Once | INTRAMUSCULAR | Status: AC | PRN
  Administered 2021-03-03: 75 mL via INTRAVENOUS

## 2021-03-04 ENCOUNTER — Inpatient Hospital Stay: Payer: Medicare PPO

## 2021-03-07 ENCOUNTER — Inpatient Hospital Stay: Payer: Medicare PPO | Admitting: Internal Medicine

## 2021-03-07 ENCOUNTER — Other Ambulatory Visit: Payer: Self-pay | Admitting: Thoracic Surgery (Cardiothoracic Vascular Surgery)

## 2021-03-07 DIAGNOSIS — C3411 Malignant neoplasm of upper lobe, right bronchus or lung: Secondary | ICD-10-CM

## 2021-03-08 ENCOUNTER — Ambulatory Visit: Payer: Self-pay | Admitting: Thoracic Surgery (Cardiothoracic Vascular Surgery)

## 2021-03-08 ENCOUNTER — Telehealth: Payer: Self-pay | Admitting: Internal Medicine

## 2021-03-08 NOTE — Telephone Encounter (Signed)
Left message to reschedule past appointment per 4/4 schedule message. Gave option to call back to reschedule.

## 2021-03-15 ENCOUNTER — Ambulatory Visit (INDEPENDENT_AMBULATORY_CARE_PROVIDER_SITE_OTHER): Payer: Self-pay | Admitting: Thoracic Surgery (Cardiothoracic Vascular Surgery)

## 2021-03-15 ENCOUNTER — Other Ambulatory Visit: Payer: Self-pay

## 2021-03-15 ENCOUNTER — Encounter: Payer: Self-pay | Admitting: Thoracic Surgery (Cardiothoracic Vascular Surgery)

## 2021-03-15 ENCOUNTER — Ambulatory Visit
Admission: RE | Admit: 2021-03-15 | Discharge: 2021-03-15 | Disposition: A | Discharge status: Home / Self Care | Payer: Medicare PPO | Source: Ambulatory Visit | Attending: Thoracic Surgery (Cardiothoracic Vascular Surgery) | Admitting: Thoracic Surgery (Cardiothoracic Vascular Surgery)

## 2021-03-15 VITALS — BP 133/80 | HR 74 | Resp 20 | Ht 70.0 in | Wt 218.0 lb

## 2021-03-15 DIAGNOSIS — R918 Other nonspecific abnormal finding of lung field: Secondary | ICD-10-CM | POA: Diagnosis not present

## 2021-03-15 DIAGNOSIS — J984 Other disorders of lung: Secondary | ICD-10-CM | POA: Diagnosis not present

## 2021-03-15 DIAGNOSIS — Z9889 Other specified postprocedural states: Secondary | ICD-10-CM

## 2021-03-15 DIAGNOSIS — C3411 Malignant neoplasm of upper lobe, right bronchus or lung: Secondary | ICD-10-CM | POA: Diagnosis not present

## 2021-03-15 DIAGNOSIS — C3490 Malignant neoplasm of unspecified part of unspecified bronchus or lung: Secondary | ICD-10-CM | POA: Diagnosis not present

## 2021-03-15 DIAGNOSIS — Z902 Acquired absence of lung [part of]: Secondary | ICD-10-CM

## 2021-03-15 NOTE — Progress Notes (Signed)
[name]       [place],Bobby [name] [address]             [phone]     HPI: Bobby [name] returns for a scheduled follow-up visit  Bobby [name] is a 75 year old man with a past [name] of tobacco abuse, COPD, bladder cancer, hypertension, hyperlipidemia, BPH, type 2 diabetes, stage Ia adenocarcinoma right upper lobe in 2010, right upper lobectomy, and synchronous adenocarcinomas of the left lower lobe status post left lower lobectomy in 2022.  I did a robotic left lower lobectomy on 12/16/2020.  Both groundglass nodules were adenocarcinomas.  All the nodes were negative.  I last saw him in the office on 01/11/2021.  He was doing well at that time.  He continues to feel well.  He says he occasionally will feel some pain at one of his incisions but that is becoming less and less frequent and he does not have to take anything for that.  He is not having any problems with shortness of breath.  Unfortunately his wife recently had a stroke and has been dealing with that.  That caused him to miss his appointment with Dr. Julien Nordmann.  Past Medical [name]:  Diagnosis Date  . Arthritis    Right wrist  . BPH (benign prostatic hypertrophy)   . COPD (chronic obstructive pulmonary disease) (HCC)    emphysema  . Frequency of urination   . [name] of lung cancer    STAGE 1A NON-SMALL CELL LUNG CARCINOMA ---  04-10-2009   S/P RIGHT UPPER LOBECTOMY , NO CHEMORADIATION--  NO RECURRENCE  (ONCOLOGIST-- DR [facility])  . Hyperlipidemia   . Hypertension   . nscl ca dx'd 04/2009   lung  . Pulmonary nodule seen on imaging study    STABLE LEFT LOWER LOBE NODULE  PER CHEST CT--  MONITORED BY DR [facility]  . Sleep apnea    OSA does not use CPAP  . Type 2 diabetes mellitus ([place])   . Urgency of urination     Current Outpatient Medications  Medication Sig Dispense Refill  . allopurinol (ZYLOPRIM) 100 MG tablet Take 100 mg by mouth daily.    Marland Kitchen amLODipine-benazepril (LOTREL) 5-20 MG per capsule  Take 1 capsule by mouth every morning.     Marland Kitchen aspirin EC 81 MG tablet Take 81 mg by mouth every evening.    . Cholecalciferol (VITAMIN D3) 50 MCG (2000 UT) TABS Take 2,000 Units by mouth daily.    . colchicine 0.6 MG tablet Take 0.6 mg by mouth daily.    . fluticasone (FLONASE) 50 MCG/ACT nasal spray Place 1 spray into both nostrils daily as needed for allergies.    Marland Kitchen glyBURIDE (DIABETA) 5 MG tablet Take 5 mg by mouth daily.    . simvastatin (ZOCOR) 20 MG tablet Take 20 mg by mouth every evening.  1  . sitaGLIPtan-metformin (JANUMET) 50-500 MG per tablet Take 1 tablet by mouth 2 (two) times daily with a meal.     No current facility-administered medications for this visit.    Physical Exam BP 133/80 (BP Location: Right Arm, Patient Position: Sitting)   Pulse 74   Resp 20   Ht 5\' 10"  (1.778 m)   Wt 218 lb (98.9 kg)   SpO2 96% Comment: RA  BMI 31.10 kg/m  75 year old man in no acute distress Alert and oriented x3 with no focal deficits Lungs diminished at bases bilaterally, no rales or wheezing Incisions well-healed No cervical or supraclavicular  adenopathy Cardiac regular rate and rhythm  Diagnostic Tests: I personally reviewed his chest x-ray.  Shows postoperative changes.  Impression: [name] is 75 year old gentleman with a [name] of tobacco abuse (quit 2000), COPD, multiple stage Ia adenocarcinomas with a right upper lobectomy in 2010 and a left lower lobectomy in 2022.  He is doing well from a surgical standpoint.  He has minimal discomfort.  His respiratory status is stable.  There are no restrictions on his activities.  He missed his appointment with Dr. Julien Nordmann around the time his wife had a stroke and he has not been able to reschedule that.  I will contact [facility] to see if we can get that rescheduled.  Plan: I will plan to see him back in about 4 months with a chest x-ray.  Melrose Nakayama, MD [facility] [phone]

## 2021-03-16 ENCOUNTER — Encounter: Payer: Self-pay | Admitting: *Deleted

## 2021-03-16 ENCOUNTER — Telehealth: Payer: Self-pay | Admitting: Physician Assistant

## 2021-03-16 NOTE — Telephone Encounter (Signed)
Scheduled appts per 4/13 sch msg. Called pt, no answer. Left msg with appt date and time.
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
## 2021-03-17 ENCOUNTER — Telehealth: Payer: Self-pay | Admitting: Physician Assistant

## 2021-03-17 NOTE — Telephone Encounter (Signed)
Called patient per 04/14 scheduled message, left a voicemail.

## 2021-03-17 NOTE — Progress Notes (Signed)
[facility] OFFICE PROGRESS NOTE  Seward Carol, MD [place] [address] [place] [address]  DIAGNOSIS: 1) Stage IA (T1a N0 M0) non-small cell lung cancer, adenocarcinoma with bronchoalveolar features diagnosed in May 2010.  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   PRIOR THERAPY: 1) Status post right upper lobectomy on Apr 30, 2009 under the care of Dr. Arlyce Dice.  2) robotic assisted left lower lobectomy and lymph node dissection to the 2 synchronous adenocarcinomas in the left lower lobe under the care of Dr. Roxan Hockey.  Performed on 12/16/2020.  CURRENT THERAPY: Observation  INTERVAL HISTORY: Bobby [name] 75 y.o. male returns to the clinic today for a follow-up visit.  The patient was last seen in the clinic on 11/03/2020 by Dr. Julien Nordmann.  At that time, the patient had another restaging CT scan the chest performed which showed pulmonary nodules in the left lower lobe which were slowly growing in size which is suspicious for primary bronchogenic carcinoma.  Dr. Julien Nordmann referred the patient to cardiothoracic surgeon, Dr. Roxan Hockey.  Dr. Roxan Hockey performed a left lower lobectomy and lymph node dissection in January 2022 in which the pathology showed 2 synchronous adenocarcinomas.   Since his surgery, the patient has been feeling "great". He denies any recent fever, chills, night sweats, or weight loss.  He denies any chest pain, shortness of breath, or hemoptysis.  He reports his baseline mild cough. He does not use tobacco products anymore. He denies any nausea, vomiting, diarrhea, or constipation.  He denies any headache or visual changes.  The patient recently had a restaging CT scan performed.  The patient is here today for evaluation to review his scan results.  MEDICAL HISTORY: Past  Medical History:  Diagnosis Date  . Arthritis    Right wrist  . BPH (benign prostatic hypertrophy)   . COPD (chronic obstructive pulmonary disease) (HCC)    emphysema  . Frequency of urination   . History of lung cancer    STAGE 1A NON-SMALL CELL LUNG CARCINOMA ---  04-10-2009   S/P RIGHT UPPER LOBECTOMY , NO CHEMORADIATION--  NO RECURRENCE  (ONCOLOGIST-- DR [facility])  . Hyperlipidemia   . Hypertension   . nscl ca dx'd 04/2009   lung  . Pulmonary nodule seen on imaging study    STABLE LEFT LOWER LOBE NODULE  PER CHEST CT--  MONITORED BY DR [facility]  . Sleep apnea    OSA does not use CPAP  . Type 2 diabetes mellitus ([place])   . Urgency of urination     ALLERGIES:  has No Known Allergies.  MEDICATIONS:  Current Outpatient Medications  Medication Sig Dispense Refill  . allopurinol (ZYLOPRIM) 100 MG tablet Take 100 mg by mouth daily.    Marland Kitchen amLODipine-benazepril (LOTREL) 5-20 MG per capsule Take 1 capsule by mouth every morning.     Marland Kitchen aspirin EC 81 MG tablet Take 81 mg by mouth every evening.    . Cholecalciferol (VITAMIN D3) 50 MCG (2000 UT) TABS Take 2,000 Units by mouth daily.    . colchicine 0.6 MG tablet Take 0.6 mg by mouth daily.    . fluticasone (FLONASE) 50 MCG/ACT nasal spray Place 1 spray into both nostrils daily as needed for allergies.    Marland Kitchen glyBURIDE (DIABETA) 5 MG tablet Take 5 mg by mouth daily.    Marland Kitchen  simvastatin (ZOCOR) 20 MG tablet Take 20 mg by mouth every evening.  1  . sitaGLIPtan-metformin (JANUMET) 50-500 MG per tablet Take 1 tablet by mouth 2 (two) times daily with a meal.     No current facility-administered medications for this visit.    SURGICAL HISTORY:  Past Surgical History:  Procedure Laterality Date  . CARDIOVASCULAR STRESS TEST  04-08-2009   NORMAL NUCLEAR STUDY/ NO ISCHEMIA/ EF 60%  . CARPAL TUNNEL RELEASE Right   . COLONOSCOPY  last 2019  . CYST EXCISION Left 10/03/2018   Procedure: EXCISION OF LEFT AXILLARY SEBACEOUS CYST;  Surgeon: Coralie Keens, MD;  Location: [facility];  Service: General;  Laterality: Left;  . excision of abcess    . INTERCOSTAL NERVE BLOCK Left 12/16/2020   Procedure: INTERCOSTAL NERVE BLOCK;  Surgeon: Melrose Nakayama, MD;  Location: [facility];  Service: Thoracic;  Laterality: Left;  . KNEE ARTHROSCOPY W/ MENISCECTOMY Left 10-31-2004  . LEFT SHOULDER ARTHROSCOPY/ ACROMIOPLASTY/ DECOMPRESSION/ SYNOVECTOMY  11-20-2007  . LYMPH NODE DISSECTION Left 12/16/2020   Procedure: LYMPH NODE DISSECTION, left lung;  Surgeon: Melrose Nakayama, MD;  Location: [facility];  Service: Thoracic;  Laterality: Left;  . POLYPECTOMY    . TRANSURETHRAL RESECTION OF PROSTATE N/A 11/03/2013   Procedure: TRANSURETHRAL RESECTION OF THE PROSTATE WITH GYRUS INSTRUMENTS;  Surgeon: Franchot Gallo, MD;  Location: [facility];  Service: Urology;  Laterality: N/A;  . TRIGGER FINGER RELEASE Right 2019  . VIDEO ASSISTED THORACOSCOPY (VATS)/ LOBECTOMY Right 04-10-2009  DR BURNEY   RIGHT UPPER LOBECTOMY AND NODE DISSECTION  . VIDEO BRONCHOSCOPY WITH ENDOBRONCHIAL NAVIGATION N/A 12/02/2020   Procedure: VIDEO BRONCHOSCOPY WITH ENDOBRONCHIAL NAVIGATION;  Surgeon: Melrose Nakayama, MD;  Location: [facility];  Service: Thoracic;  Laterality: N/A;  . XI ROBOTIC ASSISTED THORASCOPY-LEFT LOWER LOBECTOMY (Left  12/16/2020    REVIEW OF SYSTEMS:   Review of Systems  Constitutional: Negative for appetite change, chills, fatigue, fever and unexpected weight change.  HENT: Negative for mouth sores, nosebleeds, sore throat and trouble swallowing.   Eyes: Negative for eye problems and icterus.  Respiratory: Positive for mild baseline cough. Negative for hemoptysis, shortness of breath and wheezing.   Cardiovascular: Negative for chest pain and leg swelling.  Gastrointestinal: Negative for abdominal pain, constipation, diarrhea, nausea and vomiting.  Genitourinary: Negative for bladder incontinence, difficulty urinating,  dysuria, frequency and hematuria.   Musculoskeletal: Negative for back pain, gait problem, neck pain and neck stiffness.  Skin: Negative for itching and rash.  Neurological: Negative for dizziness, extremity weakness, gait problem, headaches, light-headedness and seizures.  Hematological: Negative for adenopathy. Does not bruise/bleed easily.  Psychiatric/Behavioral: Negative for confusion, depression and sleep disturbance. The patient is not nervous/anxious.     PHYSICAL EXAMINATION:  Blood pressure 126/61, pulse 73, temperature 98.2 F (36.8 C), temperature source Tympanic, resp. rate 16, height 5\' 10"  (1.778 m), weight 221 lb 4.8 oz (100.4 kg), SpO2 100 %.  ECOG PERFORMANCE STATUS: 0 - Asymptomatic  Physical Exam  Constitutional: Oriented to person, place, and time and well-developed, well-nourished, and in no distress.  HENT:  Head: Normocephalic and atraumatic.  Mouth/Throat: Oropharynx is clear and moist. No oropharyngeal exudate.  Eyes: Conjunctivae are normal. Right eye exhibits no discharge. Left eye exhibits no discharge. No scleral icterus.  Neck: Normal range of motion. Neck supple.  Cardiovascular: Normal rate, regular rhythm, normal heart sounds and intact distal pulses.   Pulmonary/Chest: Effort normal and breath sounds normal. No respiratory distress. No  wheezes. No rales.  Abdominal: Soft. Bowel sounds are normal. Exhibits no distension and no mass. There is no tenderness.  Musculoskeletal: Normal range of motion. Exhibits no edema.  Lymphadenopathy:    No cervical adenopathy.  Neurological: Alert and oriented to person, place, and time. Exhibits normal muscle tone. Gait normal. Coordination normal.  Skin: Skin is warm and dry. No rash noted. Not diaphoretic. No erythema. No pallor.  Psychiatric: Mood, memory and judgment normal.  Vitals reviewed.  LABORATORY DATA: Lab Results  Component Value Date   WBC 7.1 03/03/2021   HGB 13.7 03/03/2021   HCT 43.3 03/03/2021    MCV 83.1 03/03/2021   PLT 271 03/03/2021      Chemistry      Component Value Date/Time   NA 144 03/03/2021 0907   NA 142 08/24/2015 1100   K 3.9 03/03/2021 0907   K 4.0 08/24/2015 1100   CL 108 03/03/2021 0907   CL 108 (H) 02/11/2013 0811   CO2 24 03/03/2021 0907   CO2 26 08/24/2015 1100   BUN 17 03/03/2021 0907   BUN 10.2 08/24/2015 1100   CREATININE 0.99 03/03/2021 0907   CREATININE 0.8 08/24/2015 1100      Component Value Date/Time   CALCIUM 9.0 03/03/2021 0907   CALCIUM 9.0 08/24/2015 1100   ALKPHOS 65 03/03/2021 0907   ALKPHOS 63 08/24/2015 1100   AST 28 03/03/2021 0907   AST 29 08/24/2015 1100   ALT 39 03/03/2021 0907   ALT 41 08/24/2015 1100   BILITOT 0.3 03/03/2021 0907   BILITOT 0.25 08/24/2015 1100       RADIOGRAPHIC STUDIES:  DG Chest 2 View  Result Date: 03/15/2021 CLINICAL DATA:  Lung cancer. Cancer of upper lobe of right lung. Left lower lobectomy. EXAM: CHEST - 2 VIEW COMPARISON:  Most recent radiograph 01/11/2021. Most recent CT 03/03/2021 FINDINGS: Postsurgical change in the right hemithorax. Streaky opacities at the right lung base appears similar to prior exam and likely represent scarring. Postsurgical change in the left hemithorax with scarring at the left lung base. Stable heart size and mediastinal contours. No evidence of acute airspace disease. No pleural fluid or pneumothorax. No acute osseous abnormalities are seen. IMPRESSION: Postsurgical change in the chest with basilar scarring. No acute findings. Electronically Signed   By: Keith Rake M.D.   On: 03/15/2021 15:25   CT Chest W Contrast  Result Date: 03/03/2021 CLINICAL DATA:  Primary Cancer Type: Lung Imaging Indication: Routine surveillance Interval therapy since last imaging?  Yes; resection Initial Cancer Diagnosis Date: 04/10/2009; Established by: Biopsy-proven Detailed Pathology: Stage Ia non-small cell lung cancer, adenocarcinoma. Primary Tumor location: Right upper lobe.  Recurrence? Yes; date(s) of recurrence: 12/02/2020; Established by: Biopsy-proven; Left lower lobe. Surgeries: Left lower lobectomy 12/16/2020. Right upper lobectomy 04/30/2009. Chemotherapy: No Immunotherapy? No Radiation therapy? No Other Cancers: Bladder cancer. EXAM: CT CHEST WITH CONTRAST TECHNIQUE: Multidetector CT imaging of the chest was performed during intravenous contrast administration. CONTRAST:  49mL OMNIPAQUE IOHEXOL 300 MG/ML  SOLN COMPARISON:  Most recent CT chest 11/01/2020.  04/29/2020 PET-CT. FINDINGS: Cardiovascular: No significant vascular findings. Normal heart size. No pericardial effusion. Mediastinum/Nodes: No enlarged mediastinal, hilar, or axillary lymph nodes. Thyroid gland, trachea, and esophagus demonstrate no significant findings. Lungs/Pleura: Status post interval left lower lobectomy. Redemonstrated findings of remote prior right upper lobectomy. Mild, underlying centrilobular emphysema. No pleural effusion or pneumothorax. Upper Abdomen: No acute abnormality. Stable, benign subcentimeter adenomatous nodularity of the left adrenal gland. Musculoskeletal: No chest wall mass or  suspicious bone lesions identified. IMPRESSION: 1. Status post interval left lower lobectomy. 2. Redemonstrated findings of remote prior right upper lobectomy. 3. No evidence of recurrent or metastatic disease in the chest on this postoperative baseline examination. 4. Emphysema. Emphysema (ICD10-J43.9). Electronically Signed   By: Eddie Candle M.D.   On: 03/03/2021 10:48     ASSESSMENT/PLAN:  This is a very pleasant 75 year old African-American male diagnosed with: 1) stage Ia non-small cell lung cancer, status post right upper lobectomy under the care of Dr. Arlyce Dice in May 2010. 2) synchronous stage I non-small cell lung cancer, adenocarcinomas (invasive mucinous adenocarcinoma and the other invasive papillary adenocarcinoma) in the left lower lobe status post left lower lobectomy under the care of Dr.  Roxan Hockey in January 2022.  The patient recently had a restaging CT scan performed.  Dr. Julien Nordmann personally and independently reviewed the scan discussed the results with the patient today.  The scan did not show any evidence of disease progression.  Dr. Julien Nordmann recommends the patient continue on observation with a restaging CT scan in 6 months.  The patient was advised to call immediately if he has any concerning symptoms in the interval. The patient voices understanding of current disease status and treatment options and is in agreement with the current care plan. All questions were answered. The patient knows to call the clinic with any problems, questions or concerns. We can certainly see the patient much sooner if necessary     Orders Placed This Encounter  Procedures  . CT Chest W Contrast    Standing Status:   Future    Standing Expiration Date:   03/22/2022    Order Specific Question:   If indicated for the ordered procedure, I authorize the administration of contrast media per Radiology protocol    Answer:   Yes    Order Specific Question:   Preferred imaging location?    Answer:   [facility]  . CBC with Differential (Cancer Center Only)    Standing Status:   Future    Standing Expiration Date:   03/22/2022  . CMP ([facility] only)    Standing Status:   Future    Standing Expiration Date:   03/22/2022      Bobby Sos Zuri Bradway, PA-C 03/22/21  ADDENDUM: Hematology/Oncology Attending: I had a face-to-face encounter with the patient today.  I reviewed his record, scan and recommended his care plan.  This is a very pleasant 75 years old African-American male with history of stage Ia non-small cell lung cancer status post right upper lobectomy in 2010.  The patient had synchronous stage Ia non-small cell lung cancer, adenocarcinoma 1 was invasive mucinous adenocarcinoma and the other invasive papillary adenocarcinoma in the left lower lobe status post left lower  lobectomy under the care of Dr. Roxan Hockey in January 2022.  He is currently on observation and feeling fine. The patient had repeat CT scan of the chest performed recently.  I personally and independently reviewed the scan and discussed the results with the patient today. His scan showed no concerning findings for disease recurrence or metastasis. I recommended for him to continue on observation with repeat CT scan of the chest in 6 months. The patient was advised to call immediately if he has any other concerning symptoms in the interval.  Disclaimer: This note was dictated with voice recognition software. Similar sounding words can inadvertently be transcribed and may be missed upon review. Eilleen Kempf, MD 03/22/21

## 2021-03-22 ENCOUNTER — Other Ambulatory Visit: Payer: Self-pay

## 2021-03-22 ENCOUNTER — Inpatient Hospital Stay: Payer: Medicare PPO

## 2021-03-22 ENCOUNTER — Encounter: Payer: Self-pay | Admitting: Physician Assistant

## 2021-03-22 ENCOUNTER — Inpatient Hospital Stay: Payer: Medicare PPO | Attending: Internal Medicine | Admitting: Physician Assistant

## 2021-03-22 VITALS — BP 126/61 | HR 73 | Temp 98.2°F | Resp 16 | Ht 70.0 in | Wt 221.3 lb

## 2021-03-22 DIAGNOSIS — C3432 Malignant neoplasm of lower lobe, left bronchus or lung: Secondary | ICD-10-CM

## 2021-03-22 DIAGNOSIS — Z85118 Personal history of other malignant neoplasm of bronchus and lung: Secondary | ICD-10-CM | POA: Diagnosis not present

## 2021-03-22 DIAGNOSIS — C3411 Malignant neoplasm of upper lobe, right bronchus or lung: Secondary | ICD-10-CM | POA: Diagnosis not present

## 2021-03-22 DIAGNOSIS — E119 Type 2 diabetes mellitus without complications: Secondary | ICD-10-CM | POA: Diagnosis not present

## 2021-04-07 ENCOUNTER — Telehealth: Payer: Self-pay | Admitting: Physician Assistant

## 2021-04-07 NOTE — Telephone Encounter (Signed)
Sch per 4/29 los, Left message.

## 2021-04-20 ENCOUNTER — Encounter (INDEPENDENT_AMBULATORY_CARE_PROVIDER_SITE_OTHER): Payer: 59 | Admitting: Ophthalmology

## 2021-06-17 DIAGNOSIS — Z8551 Personal history of malignant neoplasm of bladder: Secondary | ICD-10-CM | POA: Diagnosis not present

## 2021-07-04 DIAGNOSIS — E1169 Type 2 diabetes mellitus with other specified complication: Secondary | ICD-10-CM | POA: Diagnosis not present

## 2021-07-04 DIAGNOSIS — C3432 Malignant neoplasm of lower lobe, left bronchus or lung: Secondary | ICD-10-CM | POA: Diagnosis not present

## 2021-07-04 DIAGNOSIS — E663 Overweight: Secondary | ICD-10-CM | POA: Diagnosis not present

## 2021-07-04 DIAGNOSIS — M109 Gout, unspecified: Secondary | ICD-10-CM | POA: Diagnosis not present

## 2021-07-04 DIAGNOSIS — I700 Atherosclerosis of aorta: Secondary | ICD-10-CM | POA: Diagnosis not present

## 2021-07-04 DIAGNOSIS — I10 Essential (primary) hypertension: Secondary | ICD-10-CM | POA: Diagnosis not present

## 2021-07-04 DIAGNOSIS — E7800 Pure hypercholesterolemia, unspecified: Secondary | ICD-10-CM | POA: Diagnosis not present

## 2021-07-04 DIAGNOSIS — Z7984 Long term (current) use of oral hypoglycemic drugs: Secondary | ICD-10-CM | POA: Diagnosis not present

## 2021-07-11 ENCOUNTER — Other Ambulatory Visit: Payer: Self-pay | Admitting: Thoracic Surgery (Cardiothoracic Vascular Surgery)

## 2021-07-11 DIAGNOSIS — C3411 Malignant neoplasm of upper lobe, right bronchus or lung: Secondary | ICD-10-CM

## 2021-07-12 ENCOUNTER — Other Ambulatory Visit: Payer: Self-pay

## 2021-07-12 ENCOUNTER — Ambulatory Visit (INDEPENDENT_AMBULATORY_CARE_PROVIDER_SITE_OTHER): Payer: Medicare PPO | Admitting: Thoracic Surgery (Cardiothoracic Vascular Surgery)

## 2021-07-12 ENCOUNTER — Ambulatory Visit
Admission: RE | Admit: 2021-07-12 | Discharge: 2021-07-12 | Disposition: A | Discharge status: Home / Self Care | Payer: Medicare PPO | Source: Ambulatory Visit | Attending: Thoracic Surgery (Cardiothoracic Vascular Surgery) | Admitting: Thoracic Surgery (Cardiothoracic Vascular Surgery)

## 2021-07-12 VITALS — BP 140/82 | HR 72 | Resp 20 | Ht 70.0 in | Wt 213.0 lb

## 2021-07-12 DIAGNOSIS — Z85118 Personal history of other malignant neoplasm of bronchus and lung: Secondary | ICD-10-CM | POA: Diagnosis not present

## 2021-07-12 DIAGNOSIS — Z902 Acquired absence of lung [part of]: Secondary | ICD-10-CM

## 2021-07-12 DIAGNOSIS — J984 Other disorders of lung: Secondary | ICD-10-CM | POA: Diagnosis not present

## 2021-07-12 DIAGNOSIS — C3411 Malignant neoplasm of upper lobe, right bronchus or lung: Secondary | ICD-10-CM

## 2021-07-12 NOTE — Progress Notes (Signed)
[address]       [place] [address]             [phone]     HPI: Bobby [name] returns for scheduled follow-up visit  Bobby [name] is a 75 year old man with a history of tobacco abuse (quit 2001), COPD, bladder cancer, hypertension, hyperlipidemia, type 2 diabetes, adenocarcinoma right upper lobe in 2010 and synchronous adenocarcinomas in the left lower lobe in 2022.  Dr. Arlyce Dice did a right upper lobectomy for a stage Ia adenocarcinoma in 2010.  More recently had 2 lung nodules in the left lower lobe that were both adenocarcinomas.  I did a left lower lobectomy for that.  They turned out to have different morphology and were consistent with synchronous stage Ia lesions.  He saw Dr. Julien Nordmann and no adjuvant therapy was recommended.  He is doing well.  He does not have any pain related to the the surgery.  He has not had any respiratory problems.  No change in appetite or weight loss.  No headaches or visual changes.  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  Past Medical History:  Diagnosis Date   Arthritis    Right wrist   BPH (benign prostatic hypertrophy)    COPD (chronic obstructive pulmonary disease) (HCC)    emphysema   Frequency of urination    History of lung cancer    STAGE 1A NON-SMALL CELL LUNG CARCINOMA ---  04-10-2009   S/P RIGHT UPPER LOBECTOMY , NO CHEMORADIATION--  NO RECURRENCE  (ONCOLOGIST-- DR Julien Nordmann)   Hyperlipidemia    Hypertension    nscl ca dx'd 04/2009   lung   Pulmonary nodule seen on imaging study    STABLE LEFT LOWER LOBE NODULE  PER CHEST CT--  MONITORED BY DR [facility] [place] [facility]   Sleep apnea    OSA does not use CPAP   Type 2 diabetes mellitus (HCC)    Urgency of urination     Current Outpatient Medications  Medication Sig Dispense Refill   allopurinol (ZYLOPRIM) 100 MG tablet Take 100 mg by mouth daily.     amLODipine-benazepril (LOTREL) 5-20 MG per capsule Take 1  capsule by mouth every morning.      aspirin EC 81 MG tablet Take 81 mg by mouth every evening.     Cholecalciferol (VITAMIN D3) 50 MCG (2000 UT) TABS Take 2,000 Units by mouth daily.     colchicine 0.6 MG tablet Take 0.6 mg by mouth daily.     fluticasone (FLONASE) 50 MCG/ACT nasal spray Place 1 spray into both nostrils daily as needed for allergies.     glyBURIDE (DIABETA) 5 MG tablet Take 5 mg by mouth daily.     simvastatin (ZOCOR) 20 MG tablet Take 20 mg by mouth every evening.  1   sitaGLIPtan-metformin (JANUMET) 50-500 MG per tablet Take 1 tablet by mouth 2 (two) times daily with a meal.     No current facility-administered medications for this visit.    Physical Exam BP 140/82   Pulse 72   Resp 20   Ht 5\' 10"  (1.778 m)   Wt 213 lb (96.6 kg)   SpO2 97% Comment: RA  BMI 30.4 kg/m  75 year old man in no acute distress Alert and oriented x3 with no focal deficits No cervical supraclavicular adenopathy Cardiac regular rate and rhythm Lungs diminished at both bases but otherwise clear Incisions well-healed  Diagnostic Tests: CHEST - 2 VIEW   COMPARISON:  03/15/2021, CT 03/03/2021, chest x-ray 11/30/2020   FINDINGS: Status post left lower and right upper lobectomy. Scarring at the bilateral lung bases. Normal cardiomediastinal silhouette. No pneumothorax.   IMPRESSION: Bilateral post lobectomy changes with scarring at the bases. No acute interval change as compared with prior radiograph.     Electronically Signed   By: Donavan Foil M.D.   On: 07/12/2021 15:28 I personally reviewed the chest x-ray images.  No change from February 2022.  Impression: Bobby [name] is a 75 year old man with a history of tobacco abuse (quit 2001), COPD, bladder cancer, hypertension, hyperlipidemia, type 2 diabetes, adenocarcinoma right upper lobe in 2010 and synchronous adenocarcinomas in the left lower lobe in 2022.  Tobacco abuse-quit 2001.  Synchronous stage Ia adenocarcinomas  left lower lobe-status post robotic assisted left lower lobectomy in January 2022.  No evidence of recurrent disease.  We will see Dr. Julien Nordmann in October with a CT scan.  Status post left lower lobectomy-no issues related to surgery.  Plan: This point he would like to follow-up with Dr. Julien Nordmann as he will be doing the primary follow-up. I will be happy to see him back again anytime in the future if I can be of any further assistance with his care  Melrose Nakayama, MD [facility] [phone]

## 2021-09-05 IMAGING — CT CT CHEST W/ CM
2 of 3 series · 15 of 36 positions shown, 18 images · IV contrast (omnipaque)
Comparison: Most recent CT chest 04/11/2019.  07/02/2012 PET-CT.

CLINICAL DATA: Primary Cancer Type: Lung
TECHNIQUE: Multidetector CT imaging of the chest was performed during
intravenous contrast administration.

CONTRAST:  75mL OMNIPAQUE IOHEXOL 300 MG/ML  SOLN

[Series 2: axial st · axial · 0.67mm/px · z∈[-230,+28]mm · 12 of 153 slices shown, 15 images]
[im 12/153  mediastinal]
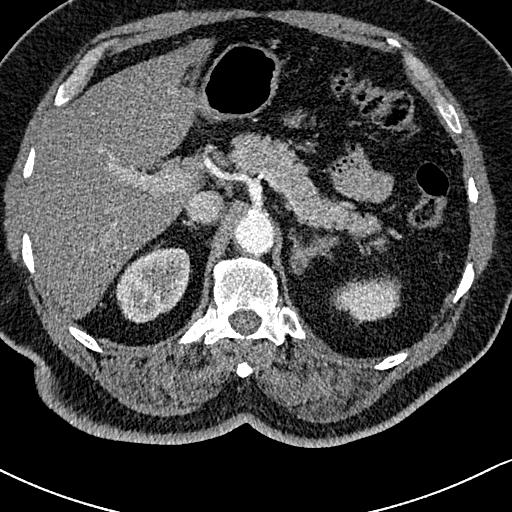
[im 12/153  lung]
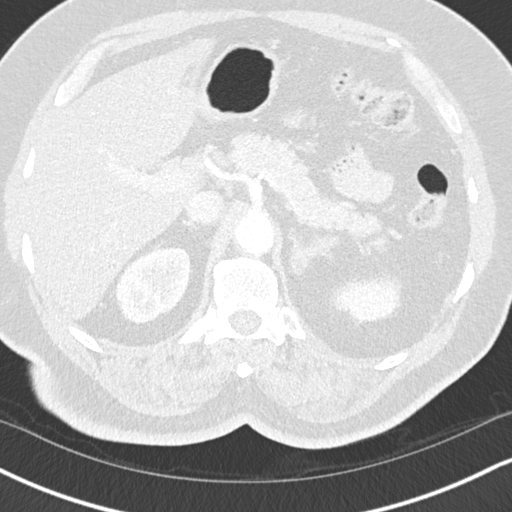
[im 23/153  lung]
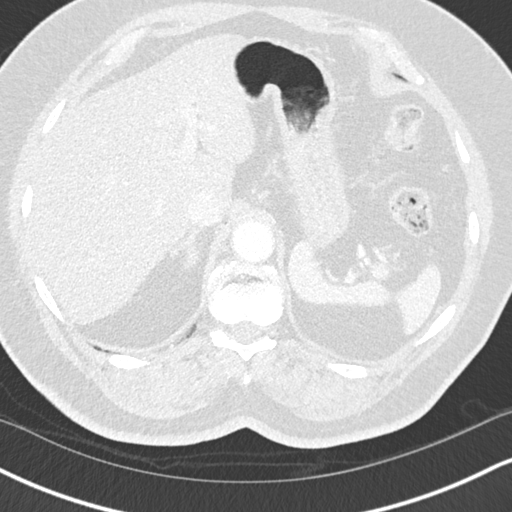
[im 34/153  lung]
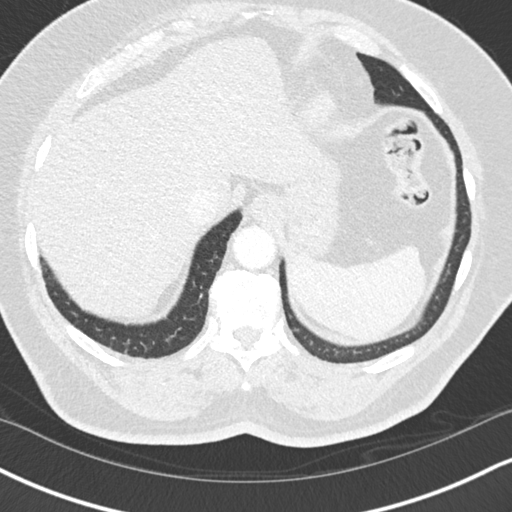
[im 46/153  lung]
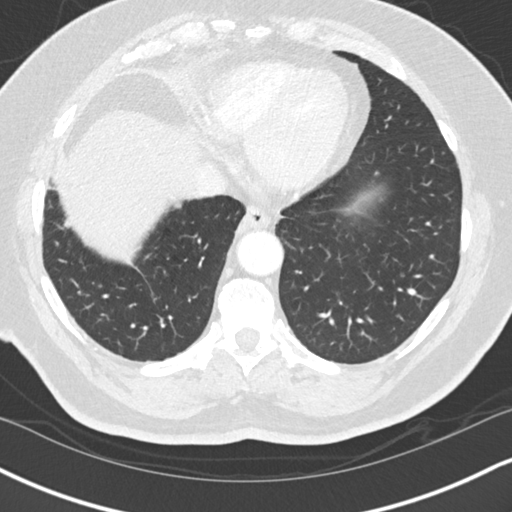
[im 57/153  mediastinal]
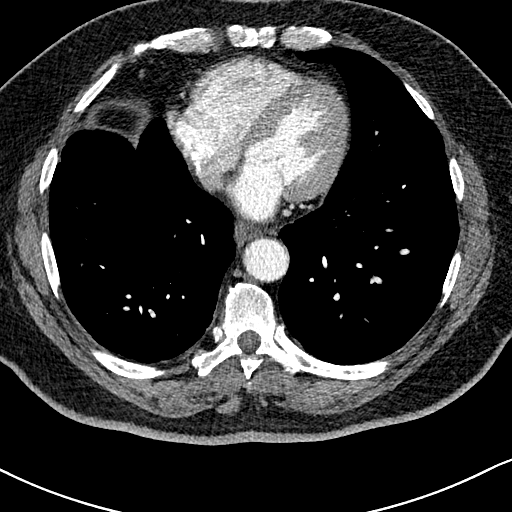
[im 57/153  lung]
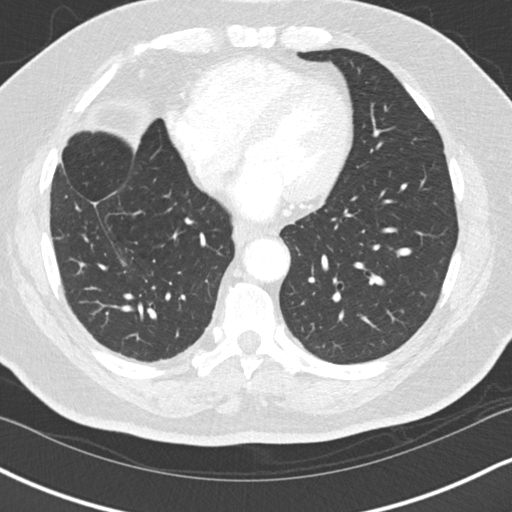
[im 68/153  lung]
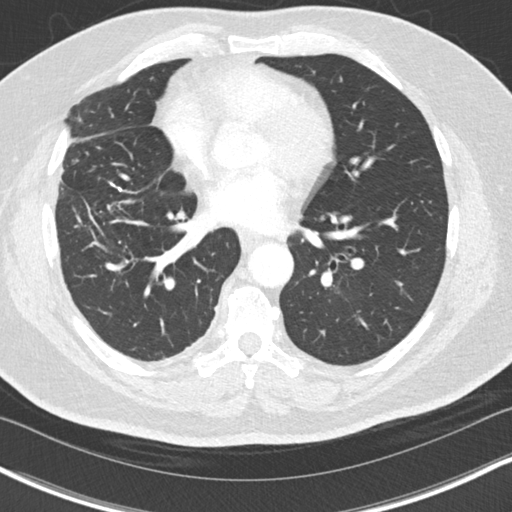
[im 85/153  lung]
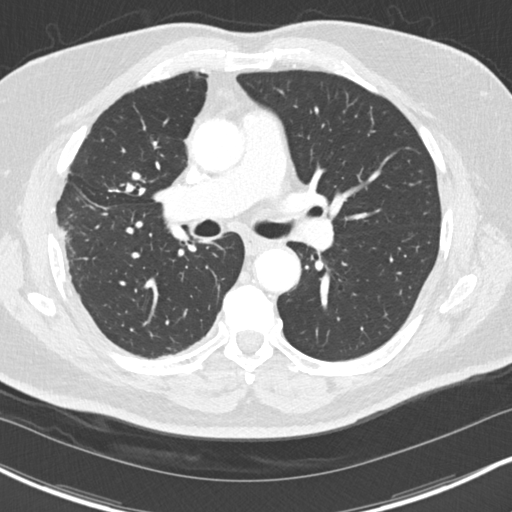
[im 96/153  lung]
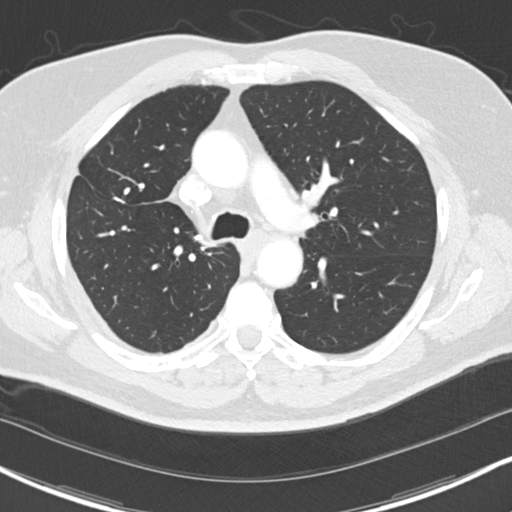
[im 107/153  mediastinal]
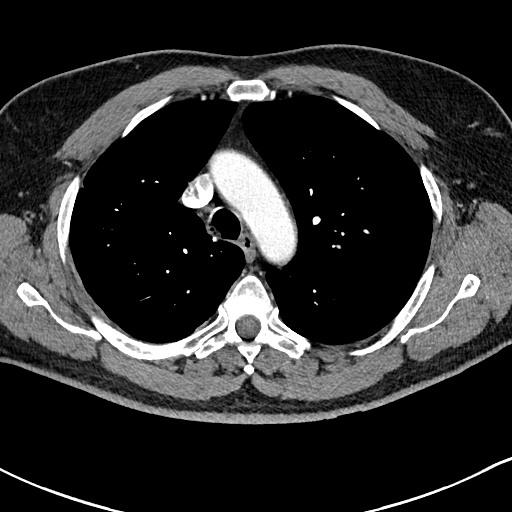
[im 107/153  lung]
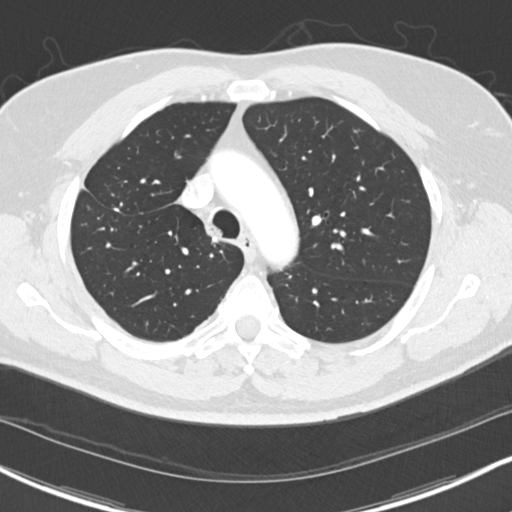
[im 119/153  lung]
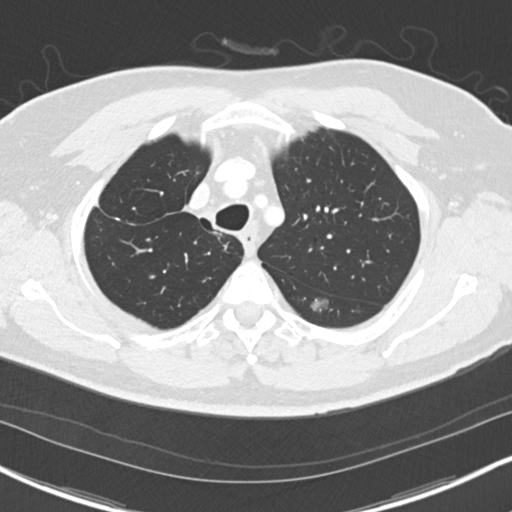
[im 130/153  lung]
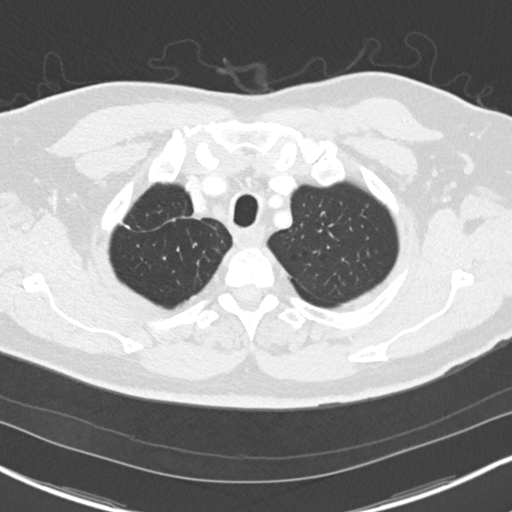
[im 141/153  lung]
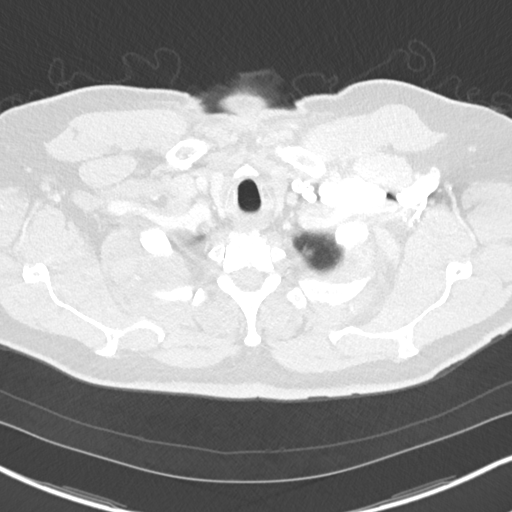

[Series 5: coronal · coronal · 0.62mm/px · 3 of 162 slices shown]
[im 33/162  lung]
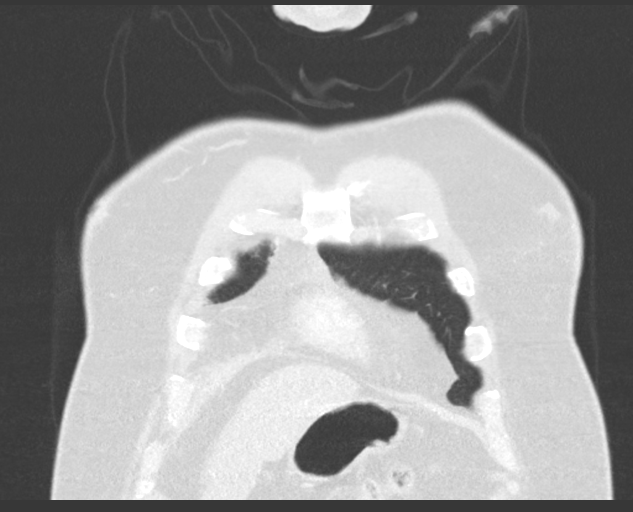
[im 65/162  lung]
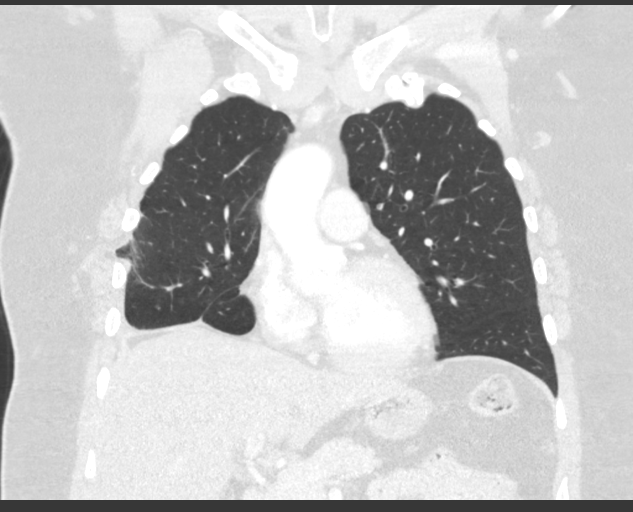
[im 97/162  lung]
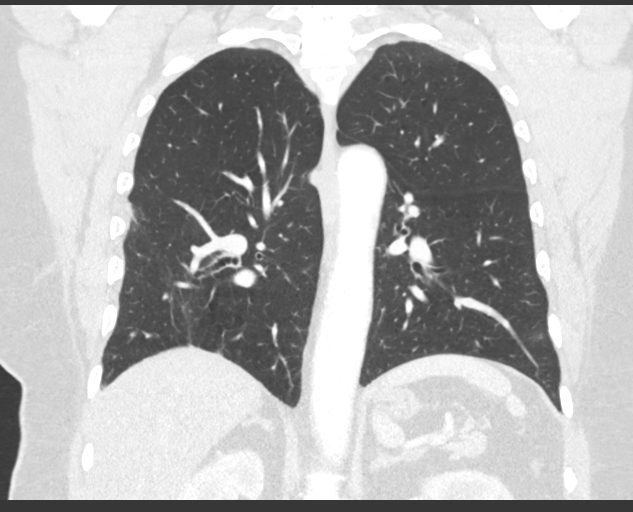

[15 of 36 positions shown; findings below may reference images not displayed]

Imaging Indication: Routine surveillance

Interval therapy since last imaging? No

Initial Cancer Diagnosis

Date: 04/30/2009    Established by: Biopsy-proven

Detailed Pathology: Stage IA non-small cell lung cancer,
adenocarcinoma

Primary Tumor location: Right upper lobe

Surgeries: Right upper lobectomy 04/30/2009

Chemotherapy: No

Immunotherapy? No

Radiation therapy? No

EXAM:
CT CHEST WITH CONTRAST
FINDINGS: Cardiovascular: Normal heart size. No significant pericardial
effusion/thickening. Atherosclerotic nonaneurysmal thoracic aorta.
Normal caliber pulmonary arteries. No central pulmonary emboli.

Mediastinum/Nodes: No discrete thyroid nodules. Unremarkable
esophagus. No pathologically enlarged axillary, mediastinal or hilar
lymph nodes.

Lungs/Pleura: No pneumothorax. No pleural effusion. Right upper
lobectomy. Scattered small subpleural nodules in the basilar right
lower lobe, largest 4 mm (series 7/image 108), all stable. Stable
irregular parenchymal band in right mid lung compatible with
nonspecific scarring. Superior segment left lower lobe ground-glass
1.3 x 1.1 cm pulmonary nodule (series 7/image 36), previously 1.1 x
0.8 cm, increased. Central left lower lobe subsolid 2.3 x 2.3 cm
pulmonary nodule (series 7/image 77), previously 2.1 x 1.8 cm using
similar measurement technique, increased. Additional smaller
indistinct peripheral left lower lobe ground-glass pulmonary nodules
are not substantially changed. No new significant pulmonary nodules.

Upper abdomen: No acute abnormality.

Musculoskeletal: No aggressive appearing focal osseous lesions. Mild
thoracic spondylosis.
IMPRESSION: 1. Enlarging subsolid 2.3 cm central left lower lobe pulmonary
nodule. Enlarging ground-glass 1.3 cm superior segment left lower
lobe pulmonary nodule. Findings are worrisome for slow growing
multifocal left lower lobe lung adenocarcinoma.
2. Stable postsurgical changes from right upper lobectomy with no
evidence of local tumor recurrence in the right lung.
3. No thoracic adenopathy.
4. Aortic Atherosclerosis ([id number]).

## 2021-09-13 ENCOUNTER — Telehealth: Payer: Self-pay | Admitting: Physician Assistant

## 2021-09-13 NOTE — Telephone Encounter (Signed)
I called the patient to give him instructions how to schedule his CT scan before his follow up visit with me on 10/20.

## 2021-09-13 NOTE — Progress Notes (Signed)
[place] [facility] OFFICE PROGRESS NOTE  Seward Carol, MD [address] [place] [address]  DIAGNOSIS:  1) Stage IA (T1a N0 M0) non-small cell lung cancer, adenocarcinoma with bronchoalveolar features diagnosed in May 2010.  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   PRIOR THERAPY: 1) Status post right upper lobectomy on Apr 30, 2009 under the care of Dr. Arlyce Dice.  2) Robotic assisted left lower lobectomy and lymph node dissection to the 2 synchronous adenocarcinomas in the left lower lobe under the care of Dr. Roxan Hockey.  Performed on 12/16/2020.  CURRENT THERAPY: Observation  INTERVAL HISTORY: Bobby [name] 75 y.o. male returns to the clinic today for a follow-up visit.  The patient is currently on observation and feeling fine. He was initially diagnosed in 2010 but then had two synchronous adenocarcinomas resected in January 2022.   Overall the patient is feeling well.  He denies any recent fever, chills, night sweats, or weight loss.  Denies any chest pain, cough, shortness of breath, or hemoptysis.  He does not use any tobacco products.  He denies any nausea, vomiting, diarrhea, or constipation.  He denies any headache or visual changes. The patient recently had a restaging CT scan performed.  The patient is here today for evaluation and to review his scan results.   MEDICAL HISTORY: Past Medical History:  Diagnosis Date   Arthritis    Right wrist   BPH (benign prostatic hypertrophy)    COPD (chronic obstructive pulmonary disease) (HCC)    emphysema   Frequency of urination    History of lung cancer    STAGE 1A NON-SMALL CELL LUNG CARCINOMA ---  04-10-2009   S/P RIGHT UPPER LOBECTOMY , NO CHEMORADIATION--  NO RECURRENCE  (ONCOLOGIST-- DR Julien Nordmann)   Hyperlipidemia     Hypertension    nscl ca dx'd 04/2009   lung   Pulmonary nodule seen on imaging study    STABLE LEFT LOWER LOBE NODULE  PER CHEST CT--  MONITORED BY DR [facility]   Sleep apnea    OSA does not use CPAP   Type 2 diabetes mellitus ([place])    Urgency of urination     ALLERGIES:  has No Known Allergies.  MEDICATIONS:  Current Outpatient Medications  Medication Sig Dispense Refill   allopurinol (ZYLOPRIM) 100 MG tablet Take 100 mg by mouth daily.     amLODipine-benazepril (LOTREL) 5-20 MG per capsule Take 1 capsule by mouth every morning.      aspirin EC 81 MG tablet Take 81 mg by mouth every evening.     Cholecalciferol (VITAMIN D3) 50 MCG (2000 UT) TABS Take 2,000 Units by mouth daily.     colchicine 0.6 MG tablet Take 0.6 mg by mouth daily.     escitalopram (LEXAPRO) 10 MG tablet Take 10 mg by mouth daily.     fluticasone (FLONASE) 50 MCG/ACT nasal spray Place 1 spray into both nostrils daily as needed for allergies.     simvastatin (ZOCOR) 20 MG tablet Take 20 mg by mouth every evening.  1   sitaGLIPtan-metformin (JANUMET) 50-500 MG per tablet Take 1 tablet by mouth 2 (two) times daily with a meal.     No current facility-administered medications for this visit.    SURGICAL HISTORY:  Past Surgical History:  Procedure Laterality Date   CARDIOVASCULAR STRESS TEST  04-08-2009   NORMAL NUCLEAR STUDY/ NO ISCHEMIA/ EF 60%   CARPAL TUNNEL RELEASE Right    COLONOSCOPY  last 2019   CYST EXCISION Left 10/03/2018   Procedure: EXCISION OF LEFT AXILLARY SEBACEOUS CYST;  Surgeon: Coralie Keens, MD;  Location: [place];  Service: General;  Laterality: Left;   excision of abcess     INTERCOSTAL NERVE BLOCK Left 12/16/2020   Procedure: INTERCOSTAL NERVE BLOCK;  Surgeon: Melrose Nakayama, MD;  Location: [place];  Service: Thoracic;  Laterality: Left;   KNEE ARTHROSCOPY W/ MENISCECTOMY Left 10-31-2004   LEFT SHOULDER ARTHROSCOPY/ ACROMIOPLASTY/ DECOMPRESSION/ SYNOVECTOMY   11-20-2007   LYMPH NODE DISSECTION Left 12/16/2020   Procedure: LYMPH NODE DISSECTION, left lung;  Surgeon: Melrose Nakayama, MD;  Location: [facility];  Service: Thoracic;  Laterality: Left;   POLYPECTOMY     TRANSURETHRAL RESECTION OF PROSTATE N/A 11/03/2013   Procedure: TRANSURETHRAL RESECTION OF THE PROSTATE WITH GYRUS INSTRUMENTS;  Surgeon: Franchot Gallo, MD;  Location: [facility];  Service: Urology;  Laterality: N/A;   TRIGGER FINGER RELEASE Right 2019   VIDEO ASSISTED THORACOSCOPY (VATS)/ LOBECTOMY Right 04-10-2009  DR Arlyce Dice   RIGHT UPPER LOBECTOMY AND NODE DISSECTION   VIDEO BRONCHOSCOPY WITH ENDOBRONCHIAL NAVIGATION N/A 12/02/2020   Procedure: VIDEO BRONCHOSCOPY WITH ENDOBRONCHIAL NAVIGATION;  Surgeon: Melrose Nakayama, MD;  Location: [facility] [place];  Service: Thoracic;  Laterality: N/A;   XI ROBOTIC ASSISTED THORASCOPY-LEFT LOWER LOBECTOMY (Left  12/16/2020    REVIEW OF SYSTEMS:   Review of Systems  Constitutional: Negative for appetite change, chills, fatigue, fever and unexpected weight change.  HENT: Negative for mouth sores, nosebleeds, sore throat and trouble swallowing.   Eyes: Negative for eye problems and icterus.  Respiratory: Negative for cough, hemoptysis, shortness of breath and wheezing.   Cardiovascular: Negative for chest pain and leg swelling.  Gastrointestinal: Negative for abdominal pain, constipation, diarrhea, nausea and vomiting.  Genitourinary: Negative for bladder incontinence, difficulty urinating, dysuria, frequency and hematuria.   Musculoskeletal: Negative for back pain, gait problem, neck pain and neck stiffness.  Skin: Negative for itching and rash.  Neurological: Negative for dizziness, extremity weakness, gait problem, headaches, light-headedness and seizures.  Hematological: Negative for adenopathy. Does not bruise/bleed easily.  Psychiatric/Behavioral: Negative for confusion, depression and sleep disturbance. The patient is not  nervous/anxious.     PHYSICAL EXAMINATION:  Blood pressure 137/79, pulse 65, temperature 97.9 F (36.6 C), temperature source Tympanic, resp. rate 18, height 5\' 10"  (1.778 m), weight 212 lb 4.8 oz (96.3 kg), SpO2 99 %.  ECOG PERFORMANCE STATUS: 1  Physical Exam  Constitutional: Oriented to person, place, and time and well-developed, well-nourished, and in no distress.  HENT:  Head: Normocephalic and atraumatic.  Mouth/Throat: Oropharynx is clear and moist. No oropharyngeal exudate.  Eyes: Conjunctivae are normal. Right eye exhibits no discharge. Left eye exhibits no discharge. No scleral icterus.  Neck: Normal range of motion. Neck supple.  Cardiovascular: Normal rate, regular rhythm, normal heart sounds and intact distal pulses.   Pulmonary/Chest: Effort normal and breath sounds normal. No respiratory distress. No wheezes. No rales.  Abdominal: Soft. Bowel sounds are normal. Exhibits no distension and no mass. There is no tenderness.  Musculoskeletal: Normal range of motion. Exhibits no edema.  Lymphadenopathy:    No cervical adenopathy.  Neurological: Alert and oriented to person, place, and time. Exhibits normal muscle tone. Gait normal. Coordination normal.  Skin: Skin is warm and dry. No rash noted. Not diaphoretic. No erythema. No  pallor.  Psychiatric: Mood, memory and judgment normal.  Vitals reviewed.  LABORATORY DATA: Lab Results  Component Value Date   WBC 7.9 09/20/2021   HGB 13.7 09/20/2021   HCT 42.3 09/20/2021   MCV 82.6 09/20/2021   PLT 253 09/20/2021      Chemistry      Component Value Date/Time   NA 143 09/20/2021 1323   NA 142 08/24/2015 1100   K 3.9 09/20/2021 1323   K 4.0 08/24/2015 1100   CL 109 09/20/2021 1323   CL 108 (H) 02/11/2013 0811   CO2 24 09/20/2021 1323   CO2 26 08/24/2015 1100   BUN 19 09/20/2021 1323   BUN 10.2 08/24/2015 1100   CREATININE 0.97 09/20/2021 1323   CREATININE 0.8 08/24/2015 1100      Component Value Date/Time    CALCIUM 9.4 09/20/2021 1323   CALCIUM 9.0 08/24/2015 1100   ALKPHOS 65 09/20/2021 1323   ALKPHOS 63 08/24/2015 1100   AST 25 09/20/2021 1323   AST 29 08/24/2015 1100   ALT 27 09/20/2021 1323   ALT 41 08/24/2015 1100   BILITOT 0.3 09/20/2021 1323   BILITOT 0.25 08/24/2015 1100       RADIOGRAPHIC STUDIES:  CT Chest W Contrast  Result Date: 09/21/2021 CLINICAL DATA:  Restaging non-small cell lung cancer. EXAM: CT CHEST WITH CONTRAST TECHNIQUE: Multidetector CT imaging of the chest was performed during intravenous contrast administration. CONTRAST:  36mL OMNIPAQUE IOHEXOL 350 MG/ML SOLN COMPARISON:  03/03/2021 FINDINGS: Cardiovascular: Heart size is normal. No pericardial effusion. Mild aortic atherosclerosis. Mediastinum/Nodes: Normal appearance of the thyroid gland. The trachea appears patent and is midline. Normal appearance of the esophagus. No enlarged axillary, supraclavicular, mediastinal or hilar lymph nodes. Lungs/Pleura: Postoperative changes from right upper lobectomy and left lower lobectomy. Mild changes of emphysema. No suspicious lung nodules identified bilaterally. Upper Abdomen: No acute abnormality. Stable left adrenal nodules compared with 11/29/20221 which likely reflect underlying benign adenomas. Musculoskeletal: No chest wall abnormality. No acute or significant osseous findings. IMPRESSION: 1. Stable CT of the chest. No specific findings identified to suggest residual or recurrence of tumor. No evidence of metastatic disease. 2. Stable left adrenal nodules compared with 11/29/20221 which likely reflect underlying benign adenomas. 3. Aortic Atherosclerosis (ICD10-I70.0) and Emphysema (ICD10-J43.9). Electronically Signed   By: Kerby Moors M.D.   On: 09/21/2021 16:29     ASSESSMENT/PLAN:  This is a very pleasant 75 year old African-American male diagnosed with: 1) stage Ia non-small cell lung cancer, status post right upper lobectomy under the care of Dr. Arlyce Dice in May  2010. 2) synchronous stage I non-small cell lung cancer, adenocarcinomas (invasive mucinous adenocarcinoma and the other invasive papillary adenocarcinoma) in the left lower lobe status post left lower lobectomy under the care of Dr. Roxan Hockey in January 2022.   The patient recently had a restaging CT scan performed.  Dr. Julien Nordmann personally and independently reviewed the scan discussed the results with the patient today.  The scan did not show evidence of disease progression.  Dr. Julien Nordmann recommends the patient continue on observation with a restaging CT scan in 6 months.  The patient was advised to call immediately if she has any concerning symptoms in the interval. The patient voices understanding of current disease status and treatment options and is in agreement with the current care plan. All questions were answered. The patient knows to call the clinic with any problems, questions or concerns. We can certainly see the patient much sooner if necessary  Orders Placed This Encounter  Procedures   CT Chest W Contrast    Standing Status:   Future    Standing Expiration Date:   09/22/2022    Order Specific Question:   If indicated for the ordered procedure, I authorize the administration of contrast media per Radiology protocol    Answer:   Yes    Order Specific Question:   Preferred imaging location?    Answer:   [facility]   CBC with Differential ([place] Only)    Standing Status:   Future    Standing Expiration Date:   09/22/2022   CMP ([place] only)    Standing Status:   Future    Standing Expiration Date:   09/22/2022      Tobe Sos Jabarri Stefanelli, PA-C 09/22/21  ADDENDUM: Hematology/Oncology Attending: I had a face-to-face encounter with the patient.  I reviewed his record, lab, scan and recommended his care plan.  This is a very pleasant 75 years old African-American male with history of stage Ia non-small cell lung cancer status post right  upper lobectomy in May 2010 and the patient had synchronous stage Ia non-small cell lung cancer, adenocarcinoma status post left lower lobectomy in January 2022 under the care of Dr. Roxan Hockey. The patient is currently on observation and he is feeling fine today with no concerning complaints.  He denied having any current chest pain or shortness of breath. He had repeat CT scan of the chest performed recently.  I personally and independently reviewed the scan and discussed the results with the patient today. His scan showed no concerning findings for disease recurrence or metastasis. I recommended for him to continue on observation with repeat CT scan of the chest in 6 months. He was advised to call immediately if he has any other concerning symptoms in the interval.  Disclaimer: This note was dictated with voice recognition software. Similar sounding words can inadvertently be transcribed and may be missed upon review. Eilleen Kempf, MD 09/23/21'

## 2021-09-20 ENCOUNTER — Other Ambulatory Visit: Payer: Self-pay

## 2021-09-20 ENCOUNTER — Inpatient Hospital Stay: Payer: Medicare PPO | Attending: Physician Assistant

## 2021-09-20 ENCOUNTER — Ambulatory Visit (HOSPITAL_COMMUNITY)
Admission: RE | Admit: 2021-09-20 | Discharge: 2021-09-20 | Disposition: A | Discharge status: Home / Self Care | Payer: Medicare PPO | Source: Ambulatory Visit | Attending: Physician Assistant | Admitting: Physician Assistant

## 2021-09-20 ENCOUNTER — Encounter (HOSPITAL_COMMUNITY): Payer: Self-pay

## 2021-09-20 DIAGNOSIS — J439 Emphysema, unspecified: Secondary | ICD-10-CM | POA: Diagnosis not present

## 2021-09-20 DIAGNOSIS — C3432 Malignant neoplasm of lower lobe, left bronchus or lung: Secondary | ICD-10-CM

## 2021-09-20 DIAGNOSIS — C3411 Malignant neoplasm of upper lobe, right bronchus or lung: Secondary | ICD-10-CM | POA: Diagnosis not present

## 2021-09-20 DIAGNOSIS — I700 Atherosclerosis of aorta: Secondary | ICD-10-CM | POA: Diagnosis not present

## 2021-09-20 DIAGNOSIS — C3490 Malignant neoplasm of unspecified part of unspecified bronchus or lung: Secondary | ICD-10-CM | POA: Diagnosis not present

## 2021-09-20 LAB — CMP (CANCER CENTER ONLY)
ALT: 27 U/L (ref 0–44)
AST: 25 U/L (ref 15–41)
Albumin: 3.8 g/dL (ref 3.5–5.0)
Alkaline Phosphatase: 65 U/L (ref 38–126)
Anion gap: 10 (ref 5–15)
BUN: 19 mg/dL (ref 8–23)
CO2: 24 mmol/L (ref 22–32)
Calcium: 9.4 mg/dL (ref 8.9–10.3)
Chloride: 109 mmol/L (ref 98–111)
Creatinine: 0.97 mg/dL (ref 0.61–1.24)
GFR, Estimated: >60 mL/min (ref >60)
Glucose, Bld: 100 mg/dL — ABNORMAL HIGH (ref 70–99)
Potassium: 3.9 mmol/L (ref 3.5–5.1)
Sodium: 143 mmol/L (ref 135–145)
Total Bilirubin: 0.3 mg/dL (ref 0.3–1.2)
Total Protein: 7.3 g/dL (ref 6.5–8.1)

## 2021-09-20 LAB — CBC WITH DIFFERENTIAL (CANCER CENTER ONLY)
Abs Immature Granulocytes: 0.03 10*3/uL (ref 0.00–0.07)
Basophils Absolute: 0 10*3/uL (ref 0.0–0.1)
Basophils Relative: 1 %
Eosinophils Absolute: 0.1 10*3/uL (ref 0.0–0.5)
Eosinophils Relative: 2 %
HCT: 42.3 % (ref 39.0–52.0)
Hemoglobin: 13.7 g/dL (ref 13.0–17.0)
Immature Granulocytes: 0 %
Lymphocytes Relative: 26 %
Lymphs Abs: 2 10*3/uL (ref 0.7–4.0)
MCH: 26.8 pg (ref 26.0–34.0)
MCHC: 32.4 g/dL (ref 30.0–36.0)
MCV: 82.6 fL (ref 80.0–100.0)
Monocytes Absolute: 0.6 10*3/uL (ref 0.1–1.0)
Monocytes Relative: 8 %
Neutro Abs: 5 10*3/uL (ref 1.7–7.7)
Neutrophils Relative %: 63 %
Platelet Count: 253 10*3/uL (ref 150–400)
RBC: 5.12 MIL/uL (ref 4.22–5.81)
RDW: 14.8 % (ref 11.5–15.5)
WBC Count: 7.9 10*3/uL (ref 4.0–10.5)
nRBC: 0 % (ref 0.0–0.2)

## 2021-09-20 MED ORDER — IOHEXOL 350 MG/ML SOLN
60.0000 mL | Freq: Once | INTRAVENOUS | Status: AC | PRN
  Administered 2021-09-20: 60 mL via INTRAVENOUS

## 2021-09-22 ENCOUNTER — Other Ambulatory Visit: Payer: Self-pay

## 2021-09-22 ENCOUNTER — Inpatient Hospital Stay (HOSPITAL_BASED_OUTPATIENT_CLINIC_OR_DEPARTMENT_OTHER): Payer: Medicare PPO | Admitting: Physician Assistant

## 2021-09-22 VITALS — BP 137/79 | HR 65 | Temp 97.9°F | Resp 18 | Ht 70.0 in | Wt 212.3 lb

## 2021-09-22 DIAGNOSIS — C3411 Malignant neoplasm of upper lobe, right bronchus or lung: Secondary | ICD-10-CM | POA: Diagnosis not present

## 2021-09-22 DIAGNOSIS — Z85118 Personal history of other malignant neoplasm of bronchus and lung: Secondary | ICD-10-CM | POA: Diagnosis not present

## 2022-01-12 DIAGNOSIS — I700 Atherosclerosis of aorta: Secondary | ICD-10-CM | POA: Diagnosis not present

## 2022-01-12 DIAGNOSIS — E1165 Type 2 diabetes mellitus with hyperglycemia: Secondary | ICD-10-CM | POA: Diagnosis not present

## 2022-01-12 DIAGNOSIS — Z7984 Long term (current) use of oral hypoglycemic drugs: Secondary | ICD-10-CM | POA: Diagnosis not present

## 2022-01-12 DIAGNOSIS — I10 Essential (primary) hypertension: Secondary | ICD-10-CM | POA: Diagnosis not present

## 2022-01-12 DIAGNOSIS — E7800 Pure hypercholesterolemia, unspecified: Secondary | ICD-10-CM | POA: Diagnosis not present

## 2022-01-12 DIAGNOSIS — Z8551 Personal history of malignant neoplasm of bladder: Secondary | ICD-10-CM | POA: Diagnosis not present

## 2022-01-12 DIAGNOSIS — J410 Simple chronic bronchitis: Secondary | ICD-10-CM | POA: Diagnosis not present

## 2022-01-12 DIAGNOSIS — C3432 Malignant neoplasm of lower lobe, left bronchus or lung: Secondary | ICD-10-CM | POA: Diagnosis not present

## 2022-01-12 DIAGNOSIS — Z0000 Encounter for general adult medical examination without abnormal findings: Secondary | ICD-10-CM | POA: Diagnosis not present

## 2022-01-12 DIAGNOSIS — E1169 Type 2 diabetes mellitus with other specified complication: Secondary | ICD-10-CM | POA: Diagnosis not present

## 2022-02-09 DIAGNOSIS — R059 Cough, unspecified: Secondary | ICD-10-CM | POA: Diagnosis not present

## 2022-02-09 DIAGNOSIS — Z03818 Encounter for observation for suspected exposure to other biological agents ruled out: Secondary | ICD-10-CM | POA: Diagnosis not present

## 2022-03-23 ENCOUNTER — Other Ambulatory Visit: Payer: Self-pay

## 2022-03-23 ENCOUNTER — Inpatient Hospital Stay: Payer: Medicare PPO | Attending: Internal Medicine

## 2022-03-23 ENCOUNTER — Ambulatory Visit (HOSPITAL_COMMUNITY)
Admission: RE | Admit: 2022-03-23 | Discharge: 2022-03-23 | Disposition: A | Discharge status: Home / Self Care | Payer: Medicare PPO | Source: Ambulatory Visit | Attending: Physician Assistant | Admitting: Physician Assistant

## 2022-03-23 DIAGNOSIS — Z902 Acquired absence of lung [part of]: Secondary | ICD-10-CM | POA: Insufficient documentation

## 2022-03-23 DIAGNOSIS — C3411 Malignant neoplasm of upper lobe, right bronchus or lung: Secondary | ICD-10-CM | POA: Diagnosis not present

## 2022-03-23 DIAGNOSIS — Z85118 Personal history of other malignant neoplasm of bronchus and lung: Secondary | ICD-10-CM | POA: Insufficient documentation

## 2022-03-23 DIAGNOSIS — C3490 Malignant neoplasm of unspecified part of unspecified bronchus or lung: Secondary | ICD-10-CM | POA: Diagnosis not present

## 2022-03-23 LAB — CMP (CANCER CENTER ONLY)
ALT: 23 U/L (ref 0–44)
AST: 20 U/L (ref 15–41)
Albumin: 3.9 g/dL (ref 3.5–5.0)
Alkaline Phosphatase: 61 U/L (ref 38–126)
Anion gap: 11 (ref 5–15)
BUN: 19 mg/dL (ref 8–23)
CO2: 24 mmol/L (ref 22–32)
Calcium: 9 mg/dL (ref 8.9–10.3)
Chloride: 108 mmol/L (ref 98–111)
Creatinine: 0.93 mg/dL (ref 0.61–1.24)
GFR, Estimated: >60 mL/min (ref >60)
Glucose, Bld: 142 mg/dL — ABNORMAL HIGH (ref 70–99)
Potassium: 3.7 mmol/L (ref 3.5–5.1)
Sodium: 143 mmol/L (ref 135–145)
Total Bilirubin: 0.3 mg/dL (ref 0.3–1.2)
Total Protein: 6.9 g/dL (ref 6.5–8.1)

## 2022-03-23 LAB — CBC WITH DIFFERENTIAL (CANCER CENTER ONLY)
Abs Immature Granulocytes: 0.02 10*3/uL (ref 0.00–0.07)
Basophils Absolute: 0 10*3/uL (ref 0.0–0.1)
Basophils Relative: 1 %
Eosinophils Absolute: 0.2 10*3/uL (ref 0.0–0.5)
Eosinophils Relative: 3 %
HCT: 40.4 % (ref 39.0–52.0)
Hemoglobin: 13.1 g/dL (ref 13.0–17.0)
Immature Granulocytes: 0 %
Lymphocytes Relative: 29 %
Lymphs Abs: 2.1 10*3/uL (ref 0.7–4.0)
MCH: 26.7 pg (ref 26.0–34.0)
MCHC: 32.4 g/dL (ref 30.0–36.0)
MCV: 82.4 fL (ref 80.0–100.0)
Monocytes Absolute: 0.6 10*3/uL (ref 0.1–1.0)
Monocytes Relative: 8 %
Neutro Abs: 4.4 10*3/uL (ref 1.7–7.7)
Neutrophils Relative %: 59 %
Platelet Count: 267 10*3/uL (ref 150–400)
RBC: 4.9 MIL/uL (ref 4.22–5.81)
RDW: 15.1 % (ref 11.5–15.5)
WBC Count: 7.4 10*3/uL (ref 4.0–10.5)
nRBC: 0 % (ref 0.0–0.2)

## 2022-03-23 MED ORDER — IOHEXOL 300 MG/ML  SOLN
100.0000 mL | Freq: Once | INTRAMUSCULAR | Status: AC | PRN
  Administered 2022-03-23: 75 mL via INTRAVENOUS

## 2022-03-23 MED ORDER — SODIUM CHLORIDE (PF) 0.9 % IJ SOLN
INTRAMUSCULAR | Status: AC
  Filled 2022-03-23: qty 50

## 2022-03-27 ENCOUNTER — Encounter: Payer: Self-pay | Admitting: Internal Medicine

## 2022-03-27 ENCOUNTER — Other Ambulatory Visit: Payer: Self-pay

## 2022-03-27 ENCOUNTER — Inpatient Hospital Stay (HOSPITAL_BASED_OUTPATIENT_CLINIC_OR_DEPARTMENT_OTHER): Payer: Medicare PPO | Admitting: Internal Medicine

## 2022-03-27 VITALS — BP 147/80 | HR 74 | Temp 97.4°F | Resp 15 | Wt 217.2 lb

## 2022-03-27 DIAGNOSIS — C3490 Malignant neoplasm of unspecified part of unspecified bronchus or lung: Secondary | ICD-10-CM | POA: Diagnosis not present

## 2022-03-27 DIAGNOSIS — Z85118 Personal history of other malignant neoplasm of bronchus and lung: Secondary | ICD-10-CM | POA: Diagnosis not present

## 2022-03-27 DIAGNOSIS — Z902 Acquired absence of lung [part of]: Secondary | ICD-10-CM | POA: Diagnosis not present

## 2022-03-27 NOTE — Progress Notes (Signed)
?    [facility] ?Telephone:([phone]   [phone] ? ?OFFICE PROGRESS NOTE ? ?Seward Carol, MD ?[address] ?[place] [address] ? ?PRINCIPAL DIAGNOSIS: Stage IA (T1a N0 M0) non-small cell lung cancer, adenocarcinoma with bronchoalveolar features diagnosed in May 2010.  ? ?PRIOR THERAPY:  ?1) Status post right upper lobectomy on Apr 30, 2009 under the care of Dr. Arlyce Dice.  ?2) Robotic assisted left lower lobectomy and lymph node dissection to the 2 synchronous adenocarcinomas in the left lower lobe under the care of Dr. Roxan Hockey.  Performed on 12/16/2020. ? ?CURRENT THERAPY: Observation. ? ?INTERVAL HISTORY: ?Bobby [name] 76 y.o. male returns to the clinic today for 85-month follow-up visit.  The patient is feeling fine today with no concerning complaints.  He denied having any chest pain, shortness of breath, cough or hemoptysis.  He denied having any fever or chills.  He has no nausea, vomiting, diarrhea or constipation.  He has no headache or visual changes.  He is currently on observation.  He had repeat CT scan of the chest performed recently and he is here for evaluation and discussion of his scan results. ? ? ?MEDICAL HISTORY: ?Past Medical History:  ?Diagnosis Date  ? Arthritis   ? Right wrist  ? BPH (benign prostatic hypertrophy)   ? COPD (chronic obstructive pulmonary disease) ([place])   ? emphysema  ? Frequency of urination   ? History of lung cancer   ? STAGE 1A NON-SMALL CELL LUNG CARCINOMA ---  04-10-2009   S/P RIGHT UPPER LOBECTOMY , NO CHEMORADIATION--  NO RECURRENCE  (ONCOLOGIST-- DR Julien Nordmann)  ? Hyperlipidemia   ? Hypertension   ? nscl ca dx'd 04/2009  ? lung  ? Pulmonary nodule seen on imaging study   ? STABLE LEFT LOWER LOBE NODULE  PER CHEST CT--  MONITORED BY DR [facility]  ? Sleep apnea   ? OSA does not use CPAP  ? Type 2 diabetes mellitus ([place])   ? Urgency of urination   ? ? ?ALLERGIES:  has No Known Allergies. ? ?MEDICATIONS:  ?Current Outpatient  Medications  ?Medication Sig Dispense Refill  ? allopurinol (ZYLOPRIM) 100 MG tablet Take 100 mg by mouth daily.    ? amLODipine-benazepril (LOTREL) 5-20 MG per capsule Take 1 capsule by mouth every morning.     ? aspirin EC 81 MG tablet Take 81 mg by mouth every evening.    ? Cholecalciferol (VITAMIN D3) 50 MCG (2000 UT) TABS Take 2,000 Units by mouth daily.    ? colchicine 0.6 MG tablet Take 0.6 mg by mouth daily.    ? escitalopram (LEXAPRO) 10 MG tablet Take 10 mg by mouth daily.    ? fluticasone (FLONASE) 50 MCG/ACT nasal spray Place 1 spray into both nostrils daily as needed for allergies.    ? simvastatin (ZOCOR) 20 MG tablet Take 20 mg by mouth every evening.  1  ? sitaGLIPtan-metformin (JANUMET) 50-500 MG per tablet Take 1 tablet by mouth 2 (two) times daily with a meal.    ? ?No current facility-administered medications for this visit.  ? ? ?SURGICAL HISTORY:  ?Past Surgical History:  ?Procedure Laterality Date  ? CARDIOVASCULAR STRESS TEST  04-08-2009  ? NORMAL NUCLEAR STUDY/ NO ISCHEMIA/ EF 60%  ? CARPAL TUNNEL RELEASE Right   ? COLONOSCOPY  last 2019  ? CYST EXCISION Left 10/03/2018  ? Procedure: EXCISION OF LEFT AXILLARY SEBACEOUS CYST;  Surgeon: Coralie Keens, MD;  Location: [facility];  Service: General;  Laterality: Left;  ? excision of abcess    ? INTERCOSTAL NERVE BLOCK Left 12/16/2020  ? Procedure: INTERCOSTAL NERVE BLOCK;  Surgeon: Melrose Nakayama, MD;  Location: [facility] [place];  Service: Thoracic;  Laterality: Left;  ? KNEE ARTHROSCOPY W/ MENISCECTOMY Left 10-31-2004  ? LEFT SHOULDER ARTHROSCOPY/ ACROMIOPLASTY/ DECOMPRESSION/ SYNOVECTOMY  11-20-2007  ? LYMPH NODE DISSECTION Left 12/16/2020  ? Procedure: LYMPH NODE DISSECTION, left lung;  Surgeon: Melrose Nakayama, MD;  Location: [facility];  Service: Thoracic;  Laterality: Left;  ? POLYPECTOMY    ? TRANSURETHRAL RESECTION OF PROSTATE N/A 11/03/2013  ? Procedure: TRANSURETHRAL RESECTION OF THE PROSTATE WITH GYRUS INSTRUMENTS;   Surgeon: Franchot Gallo, MD;  [facility];  Service: Urology;  Laterality: N/A;  ? TRIGGER FINGER RELEASE Right 2019  ? VIDEO ASSISTED THORACOSCOPY (VATS)/ LOBECTOMY Right 04-10-2009  DR Arlyce Dice  ? RIGHT UPPER LOBECTOMY AND NODE DISSECTION  ? VIDEO BRONCHOSCOPY WITH ENDOBRONCHIAL NAVIGATION N/A 12/02/2020  ? Procedure: VIDEO BRONCHOSCOPY WITH ENDOBRONCHIAL NAVIGATION;  Surgeon: Melrose Nakayama, MD;  Location: [place];  Service: Thoracic;  Laterality: N/A;  ? XI ROBOTIC ASSISTED THORASCOPY-LEFT LOWER LOBECTOMY (Left  12/16/2020  ? ? ?REVIEW OF SYSTEMS:  A comprehensive review of systems was negative.  ? ?PHYSICAL EXAMINATION: General appearance: alert, cooperative, and no distress ?Head: Normocephalic, without obvious abnormality, atraumatic ?Neck: no adenopathy, supple, symmetrical, trachea midline, and thyroid not enlarged, symmetric, no tenderness/mass/nodules ?Lymph nodes: Cervical, supraclavicular, and axillary nodes normal. ?Resp: clear to auscultation bilaterally and normal percussion bilaterally ?Back: symmetric, no curvature. ROM normal. No CVA tenderness. ?Cardio: regular rate and rhythm, S1, S2 normal, no murmur, click, rub or gallop ?GI: soft, non-tender; bowel sounds normal; no masses,  no organomegaly ?Extremities: extremities normal, atraumatic, no cyanosis or edema ? ?ECOG PERFORMANCE STATUS: 0 - Asymptomatic ? ?Blood pressure (!) 147/80, pulse 74, temperature (!) 97.4 ?F (36.3 ?C), resp. rate 15, weight 217 lb 3.2 oz (98.5 kg), SpO2 97 %. ? ?LABORATORY DATA: ?Lab Results  ?Component Value Date  ? WBC 7.4 03/23/2022  ? HGB 13.1 03/23/2022  ? HCT 40.4 03/23/2022  ? MCV 82.4 03/23/2022  ? PLT 267 03/23/2022  ? ? ?  Chemistry   ?   ?Component Value Date/Time  ? NA 143 03/23/2022 1138  ? NA 142 08/24/2015 1100  ? K 3.7 03/23/2022 1138  ? K 4.0 08/24/2015 1100  ? CL 108 03/23/2022 1138  ? CL 108 (H) 02/11/2013 5732  ? CO2 24 03/23/2022 1138  ? CO2 26 08/24/2015 1100  ? BUN 19  03/23/2022 1138  ? BUN 10.2 08/24/2015 1100  ? CREATININE 0.93 03/23/2022 1138  ? CREATININE 0.8 08/24/2015 1100  ?    ?Component Value Date/Time  ? CALCIUM 9.0 03/23/2022 1138  ? CALCIUM 9.0 08/24/2015 1100  ? ALKPHOS 61 03/23/2022 1138  ? ALKPHOS 63 08/24/2015 1100  ? AST 20 03/23/2022 1138  ? AST 29 08/24/2015 1100  ? ALT 23 03/23/2022 1138  ? ALT 41 08/24/2015 1100  ? BILITOT 0.3 03/23/2022 1138  ? BILITOT 0.25 08/24/2015 1100  ?  ? ? ? ?RADIOGRAPHIC STUDIES: ?CT Chest W Contrast ? ?Result Date: 03/24/2022 ?CLINICAL DATA:  Primary Cancer Type: Lung Imaging Indication: Routine surveillance Interval therapy since last imaging? No Initial Cancer Diagnosis Date: 04/10/2009; Established by: Biopsy-proven Detailed Pathology: Stage Ia non-small cell lung cancer, adenocarcinoma. Primary Tumor location:  Right upper lobe. Recurrence?  Yes; Date(s) of recurrence: 12/02/2020 Established by: Biopsy-proven; Stage  I synchronous adenocarcinomas in the left lower lobe. Surgeries: Left lower lobectomy 12/16/2020. Right upper lobectomy 04/30/2009. Chemotherapy: No Immunotherapy? No Radiation therapy? No EXAM: CT CHEST WITH CONTRAST TECHNIQUE: Multidetector CT imaging of the chest was performed during intravenous contrast administration. RADIATION DOSE REDUCTION: This exam was performed according to the departmental dose-optimization program which includes automated exposure control, adjustment of the mA and/or kV according to patient size and/or use of iterative reconstruction technique. CONTRAST:  25mL OMNIPAQUE IOHEXOL 300 MG/ML  SOLN COMPARISON:  Most recent CT chest 09/20/2021 FINDINGS: Cardiovascular:  No acute findings. Mediastinum/Nodes: No masses or pathologically enlarged lymph nodes identified. Lungs/Pleura: Postop changes from previous right upper lobectomy remain stable. Postsurgical changes from left lower lobectomy also remains stable. No suspicious nodules or masses identified. No evidence of pulmonary infiltrate or  pleural effusion. Upper Abdomen: Mild nodular hypertrophy of the left adrenal gland is stable, without evidence of discrete adrenal mass. Musculoskeletal:  No suspicious bone lesions. IMPRESSION: Stable e

## 2022-04-05 ENCOUNTER — Other Ambulatory Visit: Payer: Medicare PPO

## 2022-04-10 ENCOUNTER — Ambulatory Visit: Payer: Medicare PPO | Admitting: Internal Medicine

## 2022-04-24 IMAGING — CR DG CHEST 2V
2 series · 2 of 2 positions shown · non-contrast
Comparison: 09/01/2009 radiographs.  11/01/2020 CT and prior.

CLINICAL DATA: Nodule of lower lobe of left lung.

EXAM:
CHEST - 2 VIEW

[w chest pa]
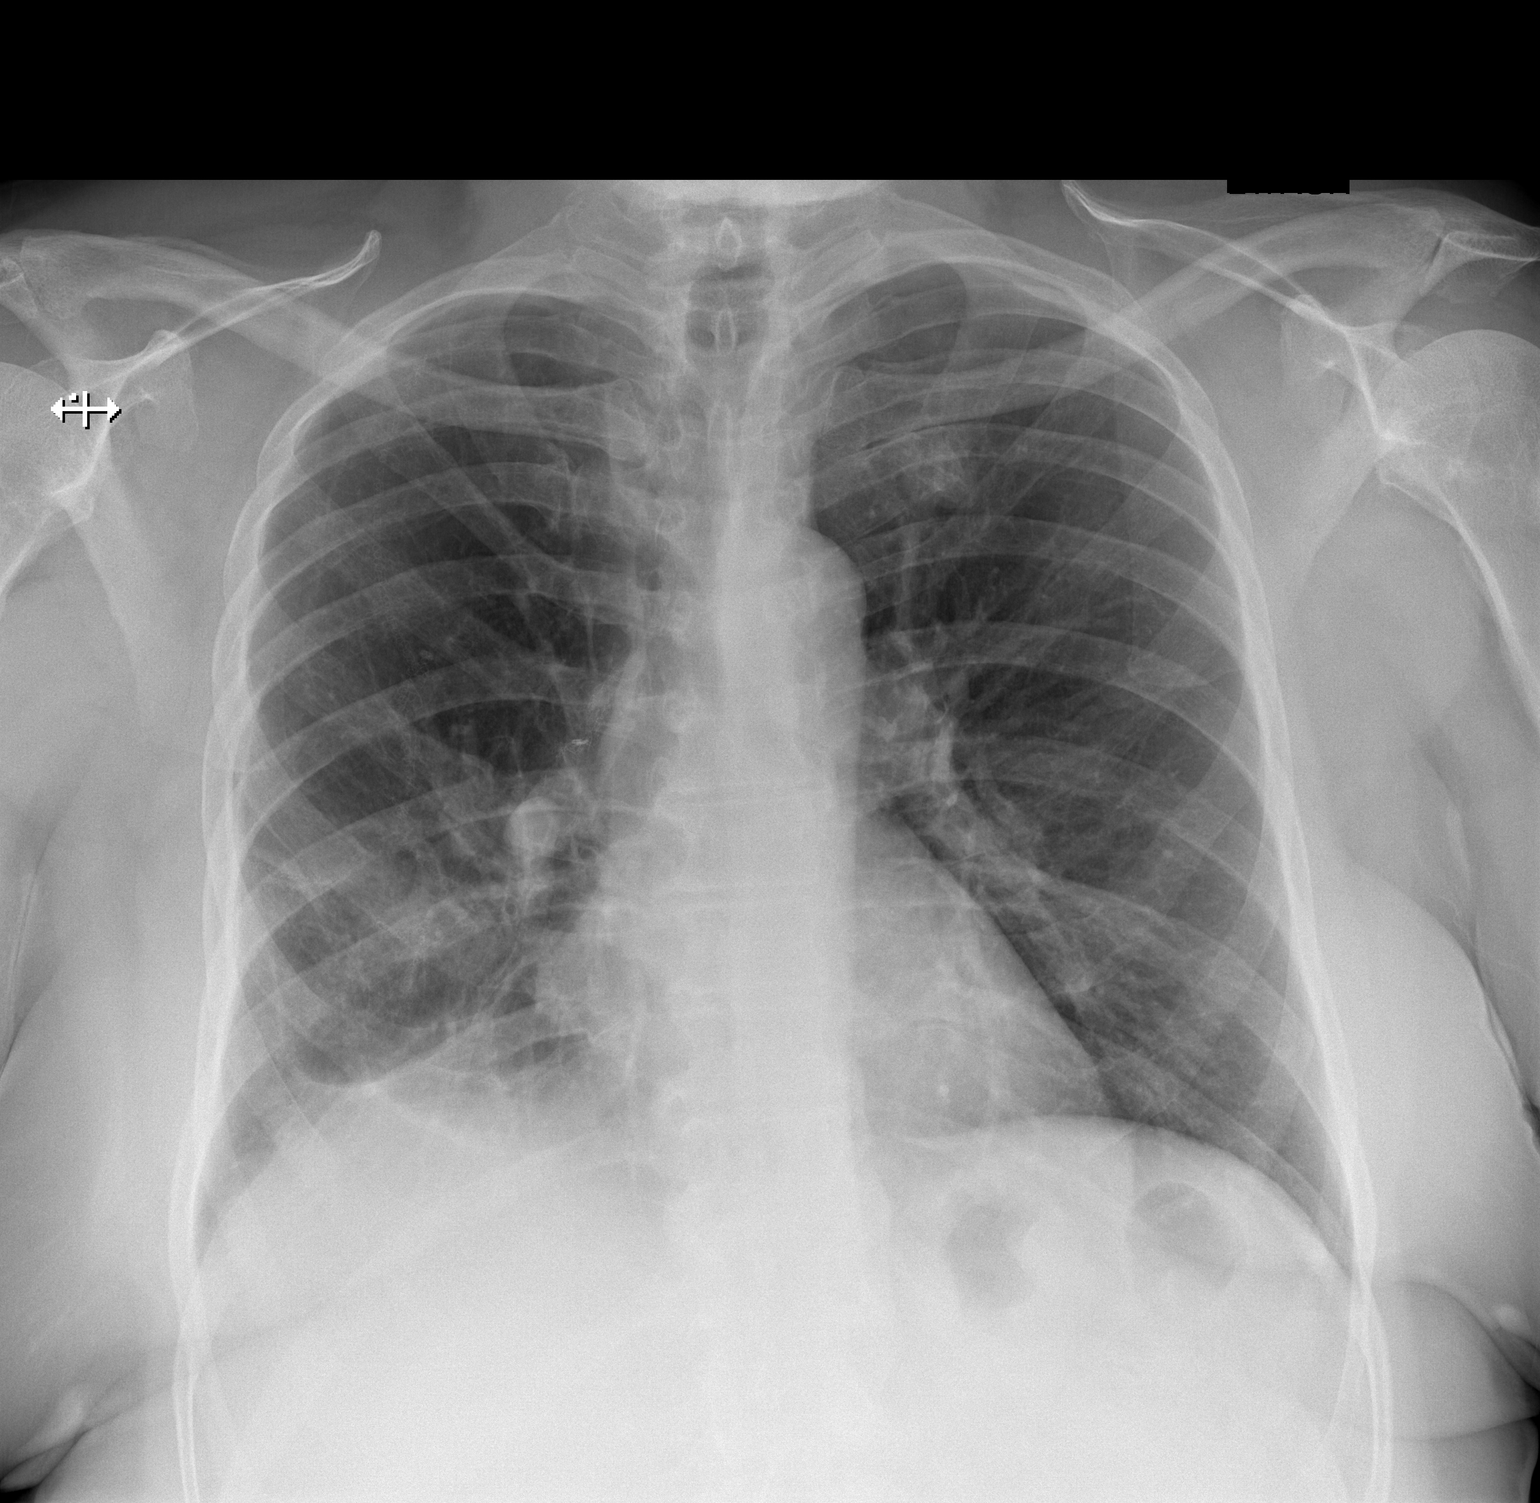

[w chest lat]
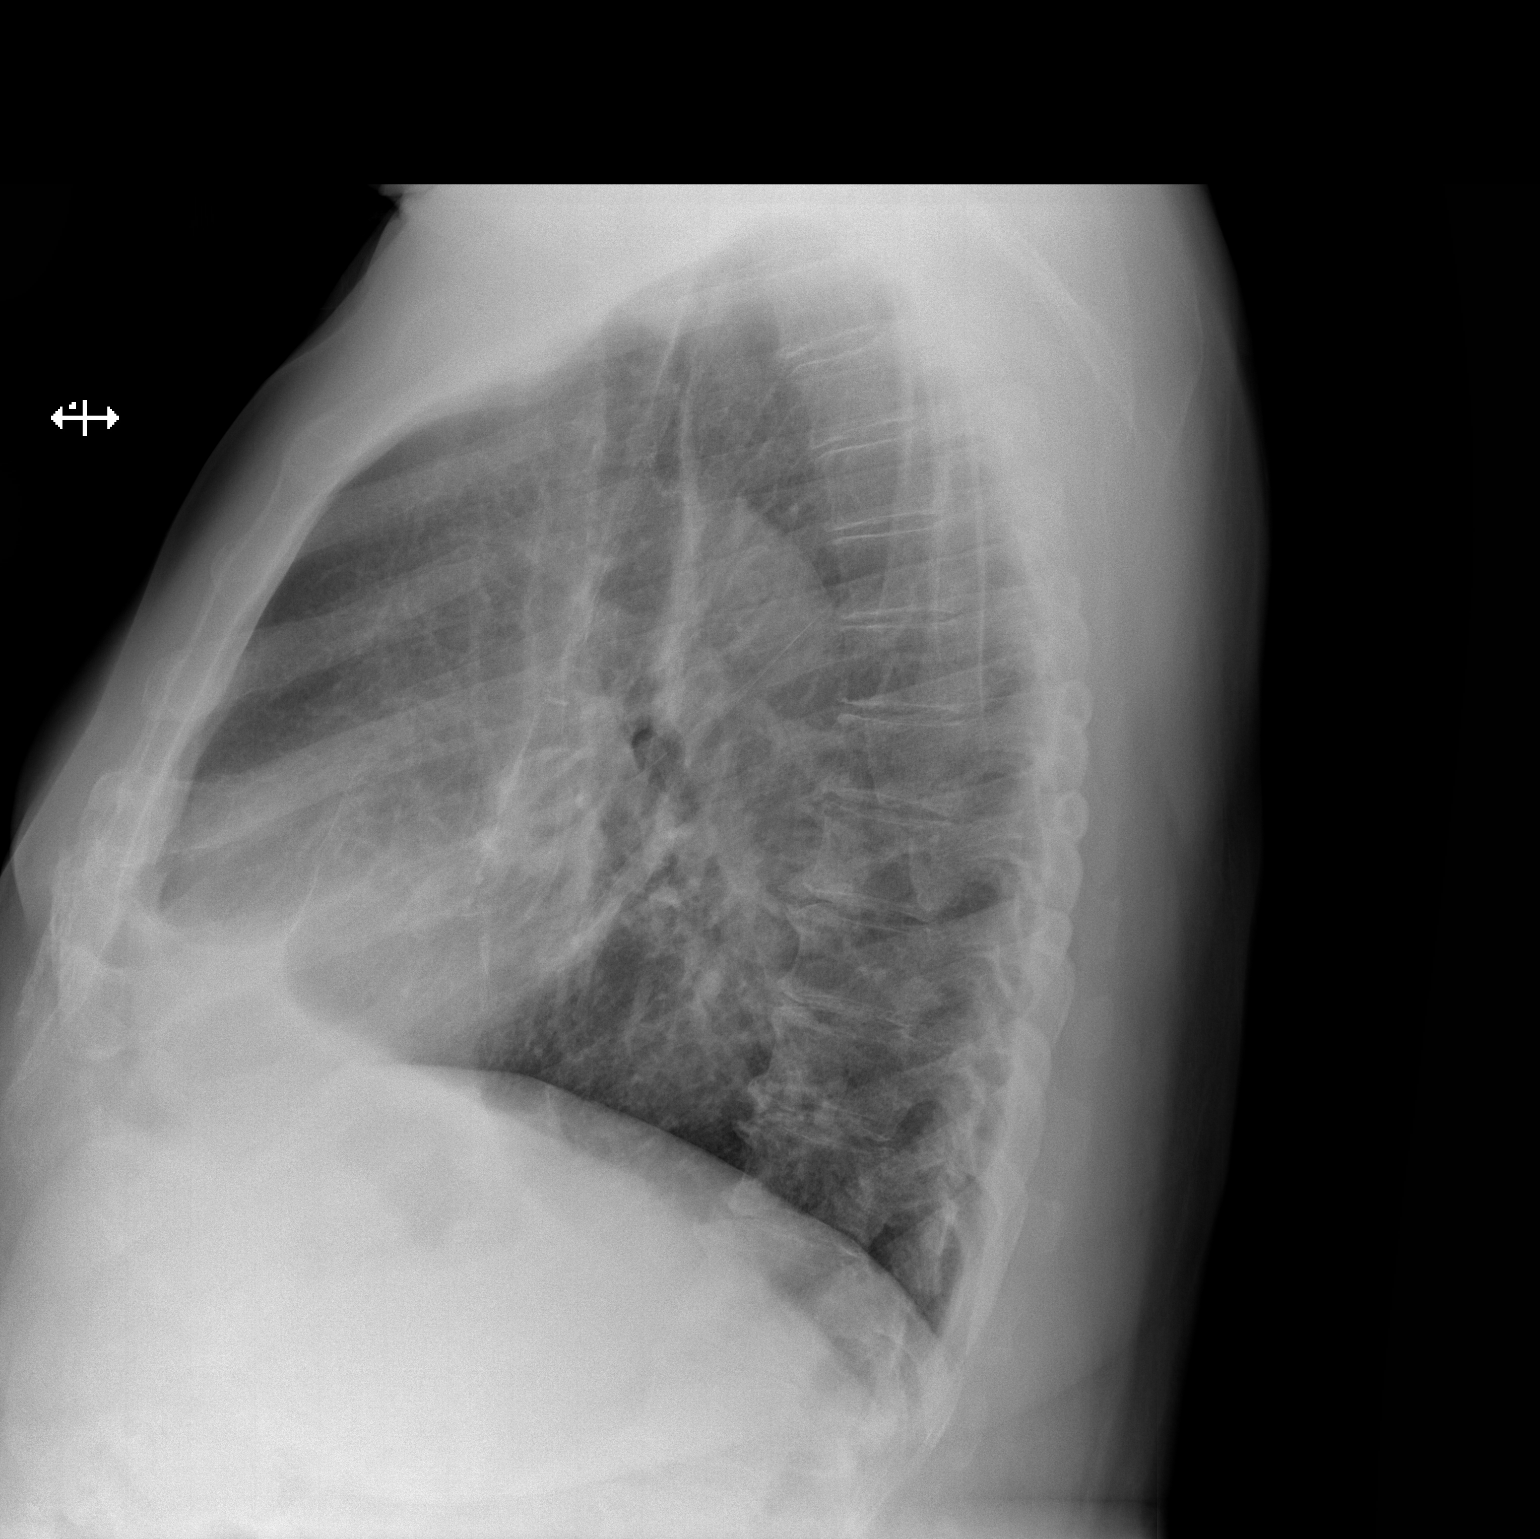

[2 of 2 positions shown; findings below may reference images not displayed]

FINDINGS: Right lung postsurgical sequela. No pneumothorax or pleural
effusion. Decreased conspicuity of right basilar opacity. Left lung
nodules demonstrated on prior CT are not well evaluated on this
exam. Cardiomediastinal silhouette within normal limits. Multilevel
spondylosis.
IMPRESSION: Right lung postsurgical sequela. Right basilar opacities may reflect
scarring however cannot exclude underlying infiltrate.

Left lung nodules are not well demonstrated on this exam.

## 2022-07-13 DIAGNOSIS — I700 Atherosclerosis of aorta: Secondary | ICD-10-CM | POA: Diagnosis not present

## 2022-07-13 DIAGNOSIS — I10 Essential (primary) hypertension: Secondary | ICD-10-CM | POA: Diagnosis not present

## 2022-07-13 DIAGNOSIS — E1169 Type 2 diabetes mellitus with other specified complication: Secondary | ICD-10-CM | POA: Diagnosis not present

## 2022-07-13 DIAGNOSIS — Z85118 Personal history of other malignant neoplasm of bronchus and lung: Secondary | ICD-10-CM | POA: Diagnosis not present

## 2022-07-13 DIAGNOSIS — C3432 Malignant neoplasm of lower lobe, left bronchus or lung: Secondary | ICD-10-CM | POA: Diagnosis not present

## 2022-07-13 DIAGNOSIS — E7800 Pure hypercholesterolemia, unspecified: Secondary | ICD-10-CM | POA: Diagnosis not present

## 2022-07-24 DIAGNOSIS — N400 Enlarged prostate without lower urinary tract symptoms: Secondary | ICD-10-CM | POA: Diagnosis not present

## 2022-07-24 DIAGNOSIS — Z8551 Personal history of malignant neoplasm of bladder: Secondary | ICD-10-CM | POA: Diagnosis not present

## 2022-08-04 ENCOUNTER — Emergency Department (HOSPITAL_COMMUNITY): Payer: Medicare PPO

## 2022-08-04 ENCOUNTER — Other Ambulatory Visit: Payer: Self-pay

## 2022-08-04 ENCOUNTER — Emergency Department (HOSPITAL_COMMUNITY)
Admission: EM | Admit: 2022-08-04 | Discharge: 2022-08-04 | Disposition: A | Discharge status: Home / Self Care | Payer: Medicare PPO | Attending: Emergency Medicine | Admitting: Emergency Medicine

## 2022-08-04 ENCOUNTER — Encounter (HOSPITAL_COMMUNITY): Payer: Self-pay

## 2022-08-04 DIAGNOSIS — M542 Cervicalgia: Secondary | ICD-10-CM | POA: Insufficient documentation

## 2022-08-04 DIAGNOSIS — R0789 Other chest pain: Secondary | ICD-10-CM | POA: Insufficient documentation

## 2022-08-04 DIAGNOSIS — Y92410 Unspecified street and highway as the place of occurrence of the external cause: Secondary | ICD-10-CM | POA: Insufficient documentation

## 2022-08-04 DIAGNOSIS — R079 Chest pain, unspecified: Secondary | ICD-10-CM | POA: Diagnosis not present

## 2022-08-04 MED ORDER — ACETAMINOPHEN 500 MG PO TABS
1000.0000 mg | ORAL_TABLET | Freq: Once | ORAL | Status: AC
  Administered 2022-08-04: 1000 mg via ORAL
  Filled 2022-08-04: qty 2
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
## 2022-08-09 DIAGNOSIS — M47816 Spondylosis without myelopathy or radiculopathy, lumbar region: Secondary | ICD-10-CM | POA: Diagnosis not present

## 2022-08-09 DIAGNOSIS — M47812 Spondylosis without myelopathy or radiculopathy, cervical region: Secondary | ICD-10-CM | POA: Diagnosis not present

## 2022-09-28 ENCOUNTER — Ambulatory Visit (HOSPITAL_COMMUNITY)
Admission: RE | Admit: 2022-09-28 | Discharge: 2022-09-28 | Disposition: A | Discharge status: Home / Self Care | Payer: Medicare PPO | Source: Ambulatory Visit | Attending: Internal Medicine | Admitting: Internal Medicine

## 2022-09-28 DIAGNOSIS — C3490 Malignant neoplasm of unspecified part of unspecified bronchus or lung: Secondary | ICD-10-CM | POA: Insufficient documentation

## 2022-09-28 LAB — POCT I-STAT CREATININE: Creatinine, Ser: 0.9 mg/dL (ref 0.61–1.24)

## 2022-09-28 MED ORDER — IOHEXOL 300 MG/ML  SOLN
80.0000 mL | Freq: Once | INTRAMUSCULAR | Status: AC | PRN
  Administered 2022-09-28: 80 mL via INTRAVENOUS

## 2022-09-28 MED ORDER — SODIUM CHLORIDE (PF) 0.9 % IJ SOLN
INTRAMUSCULAR | Status: AC
  Filled 2022-09-28: qty 50

## 2022-10-04 ENCOUNTER — Telehealth: Payer: Self-pay | Admitting: *Deleted

## 2022-10-04 NOTE — Telephone Encounter (Signed)
Pt called requesting the results of his CT. Pt did not have a follow up appt.  Notified of stable exam. Message to scheduler to make a follow up appt

## 2022-10-10 ENCOUNTER — Inpatient Hospital Stay: Payer: Medicare PPO | Attending: Internal Medicine | Admitting: Internal Medicine

## 2022-10-10 ENCOUNTER — Other Ambulatory Visit: Payer: Self-pay

## 2022-10-10 VITALS — BP 140/75 | HR 77 | Temp 98.7°F | Resp 15 | Ht 70.0 in | Wt 224.8 lb

## 2022-10-10 DIAGNOSIS — C3490 Malignant neoplasm of unspecified part of unspecified bronchus or lung: Secondary | ICD-10-CM | POA: Diagnosis not present

## 2022-10-10 DIAGNOSIS — Z08 Encounter for follow-up examination after completed treatment for malignant neoplasm: Secondary | ICD-10-CM | POA: Insufficient documentation

## 2022-10-10 DIAGNOSIS — Z85118 Personal history of other malignant neoplasm of bronchus and lung: Secondary | ICD-10-CM | POA: Diagnosis not present

## 2022-10-10 NOTE — Progress Notes (Signed)
[name] [phone]   [phone]  OFFICE PROGRESS NOTE  Seward Carol, MD 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   PRIOR THERAPY:  1) Status post right upper lobectomy on Apr 30, 2009 under the care of Dr. Arlyce Dice.  2) Robotic assisted left lower lobectomy and lymph node dissection to the 2 synchronous adenocarcinomas in the left lower lobe under the care of Dr. Roxan Hockey.  Performed on 12/16/2020.  CURRENT THERAPY: Observation.  INTERVAL HISTORY: Bobby [name] 76 y.o. male returns to the clinic today for 6 months follow-up visit.  The patient is feeling fine today with no concerning complaints.  He denied having any chest pain, shortness of breath, cough or hemoptysis.  He has no nausea, vomiting, diarrhea or constipation.  He has no headache or visual changes.  He denied having any recent weight loss or night sweats.  He is here today for evaluation with repeat CT scan of the chest for restaging of his disease.  MEDICAL HISTORY: Past Medical History:  Diagnosis Date   Arthritis    Right wrist   BPH (benign prostatic hypertrophy)    COPD (chronic obstructive pulmonary disease) (HCC)    emphysema   Frequency of urination    History of lung cancer    STAGE 1A NON-SMALL CELL LUNG CARCINOMA ---  04-10-2009   S/P RIGHT UPPER LOBECTOMY , NO CHEMORADIATION--  NO RECURRENCE  (ONCOLOGIST-- DR Julien Nordmann)   Hyperlipidemia    Hypertension    nscl ca dx'd 04/2009   lung   Pulmonary nodule seen on imaging study    STABLE LEFT LOWER LOBE NODULE  PER CHEST CT--  MONITORED BY DR [facility]   Sleep apnea    OSA does not use CPAP   Type 2 diabetes mellitus ([place])    Urgency of urination     ALLERGIES:  has No Known Allergies.  MEDICATIONS:  Current Outpatient Medications  Medication Sig Dispense  Refill   allopurinol (ZYLOPRIM) 100 MG tablet Take 100 mg by mouth daily.     amLODipine-benazepril (LOTREL) 5-20 MG per capsule Take 1 capsule by mouth every morning.      aspirin EC 81 MG tablet Take 81 mg by mouth every evening.     Cholecalciferol (VITAMIN D3) 50 MCG (2000 UT) TABS Take 2,000 Units by mouth daily.     colchicine 0.6 MG tablet Take 0.6 mg by mouth daily.     escitalopram (LEXAPRO) 10 MG tablet Take 10 mg by mouth daily.     fluticasone (FLONASE) 50 MCG/ACT nasal spray Place 1 spray into both nostrils daily as needed for allergies.     simvastatin (ZOCOR) 20 MG tablet Take 20 mg by mouth every evening.  1   sitaGLIPtan-metformin (JANUMET) 50-500 MG per tablet Take 1 tablet by mouth 2 (two) times daily with a meal.     No current facility-administered medications for this visit.    SURGICAL HISTORY:  Past Surgical History:  Procedure Laterality Date   CARDIOVASCULAR STRESS TEST  04-08-2009   NORMAL NUCLEAR STUDY/ NO ISCHEMIA/ EF 60%   CARPAL TUNNEL RELEASE Right    COLONOSCOPY  last 2019   CYST EXCISION Left 10/03/2018   Procedure: EXCISION OF LEFT AXILLARY SEBACEOUS CYST;  Surgeon: Coralie Keens, MD;  Location: [facility];  Service: General;  Laterality: Left;  excision of abcess     INTERCOSTAL NERVE BLOCK Left 12/16/2020   Procedure: INTERCOSTAL NERVE BLOCK;  Surgeon: Melrose Nakayama, MD;  Location: [facility] OR;  Service: Thoracic;  Laterality: Left;   KNEE ARTHROSCOPY W/ MENISCECTOMY Left 10-31-2004   LEFT SHOULDER ARTHROSCOPY/ ACROMIOPLASTY/ DECOMPRESSION/ SYNOVECTOMY  11-20-2007   LYMPH NODE DISSECTION Left 12/16/2020   Procedure: LYMPH NODE DISSECTION, left lung;  Surgeon: Melrose Nakayama, MD;  Location: [facility];  Service: Thoracic;  Laterality: Left;   POLYPECTOMY     TRANSURETHRAL RESECTION OF PROSTATE N/A 11/03/2013   Procedure: TRANSURETHRAL RESECTION OF THE PROSTATE WITH GYRUS INSTRUMENTS;  Surgeon: Franchot Gallo, MD;  Location:  [facility];  Service: Urology;  Laterality: N/A;   TRIGGER FINGER RELEASE Right 2019   VIDEO ASSISTED THORACOSCOPY (VATS)/ LOBECTOMY Right 04-10-2009  DR Arlyce Dice   RIGHT UPPER LOBECTOMY AND NODE DISSECTION   VIDEO BRONCHOSCOPY WITH ENDOBRONCHIAL NAVIGATION N/A 12/02/2020   Procedure: VIDEO BRONCHOSCOPY WITH ENDOBRONCHIAL NAVIGATION;  Surgeon: Melrose Nakayama, MD;  Location: [facility];  Service: Thoracic;  Laterality: N/A;   XI ROBOTIC ASSISTED THORASCOPY-LEFT LOWER LOBECTOMY (Left  12/16/2020    REVIEW OF SYSTEMS:  A comprehensive review of systems was negative.   PHYSICAL EXAMINATION: General appearance: alert, cooperative, and no distress Head: Normocephalic, without obvious abnormality, atraumatic Neck: no adenopathy, supple, symmetrical, trachea midline, and thyroid not enlarged, symmetric, no tenderness/mass/nodules Lymph nodes: Cervical, supraclavicular, and axillary nodes normal. Resp: clear to auscultation bilaterally and normal percussion bilaterally Back: symmetric, no curvature. ROM normal. No CVA tenderness. Cardio: regular rate and rhythm, S1, S2 normal, no murmur, click, rub or gallop GI: soft, non-tender; bowel sounds normal; no masses,  no organomegaly Extremities: extremities normal, atraumatic, no cyanosis or edema  ECOG PERFORMANCE STATUS: 0 - Asymptomatic  Blood pressure (!) 140/75, pulse 77, temperature 98.7 F (37.1 C), temperature source Oral, resp. rate 15, height 5\' 10"  (1.778 m), weight 224 lb 12.8 oz (102 kg), SpO2 96 %.  LABORATORY DATA: Lab Results  Component Value Date   WBC 7.4 03/23/2022   HGB 13.1 03/23/2022   HCT 40.4 03/23/2022   MCV 82.4 03/23/2022   PLT 267 03/23/2022      Chemistry      Component Value Date/Time   NA 143 03/23/2022 1138   NA 142 08/24/2015 1100   K 3.7 03/23/2022 1138   K 4.0 08/24/2015 1100   CL 108 03/23/2022 1138   CL 108 (H) 02/11/2013 0811   CO2 24 03/23/2022 1138   CO2 26 08/24/2015 1100   BUN  19 03/23/2022 1138   BUN 10.2 08/24/2015 1100   CREATININE 0.90 09/28/2022 1234   CREATININE 0.93 03/23/2022 1138   CREATININE 0.8 08/24/2015 1100      Component Value Date/Time   CALCIUM 9.0 03/23/2022 1138   CALCIUM 9.0 08/24/2015 1100   ALKPHOS 61 03/23/2022 1138   ALKPHOS 63 08/24/2015 1100   AST 20 03/23/2022 1138   AST 29 08/24/2015 1100   ALT 23 03/23/2022 1138   ALT 41 08/24/2015 1100   BILITOT 0.3 03/23/2022 1138   BILITOT 0.25 08/24/2015 1100       RADIOGRAPHIC STUDIES: CT Chest W Contrast  Result Date: 10/01/2022 CLINICAL DATA:  Non-small-cell lung cancer restaging. * Tracking Code: BO * EXAM: CT CHEST WITH CONTRAST TECHNIQUE: Multidetector CT imaging of the chest was performed during intravenous contrast administration. RADIATION DOSE REDUCTION: This exam was performed according to the departmental dose-optimization program which includes automated exposure control,  adjustment of the mA and/or kV according to patient size and/or use of iterative reconstruction technique. CONTRAST:  64mL OMNIPAQUE IOHEXOL 300 MG/ML  SOLN COMPARISON:  None Available.  Skip of 03/23/2022 FINDINGS: Cardiovascular: The heart size is normal. No substantial pericardial effusion. Mild atherosclerotic calcification is noted in the wall of the thoracic aorta. Mediastinum/Nodes: No mediastinal lymphadenopathy. There is no hilar lymphadenopathy. The esophagus has normal imaging features. There is no axillary lymphadenopathy. Lungs/Pleura: Postsurgical scarring noted right lung compatible with right upper lobectomy. Appearance is stable in the interval. Surgical changes in the left hilum are compatible with the history of left lower lobectomy. No new suspicious pulmonary nodule or mass. No focal airspace consolidation. No pleural effusion. Upper Abdomen: Unremarkable. Musculoskeletal: No worrisome lytic or sclerotic osseous abnormality. IMPRESSION: 1. Stable exam. No new or progressive findings to suggest  recurrent or metastatic disease. 2. Status post right upper lobectomy and left lower lobectomy. 3.  Aortic Atherosclerosis (ICD10-I70.0). Electronically Signed   By: Misty Stanley M.D.   On: 10/01/2022 15:43     ASSESSMENT AND PLAN: This is a very pleasant 76 years old [place] American male with stage IA non-small cell lung cancer status post right upper lobectomy and has been observation since May of 2010 daily with no evidence for disease recurrence. He was found on recent CT scan of the chest to have enlarging subsolid central left lower lobe as well as groundglass opacity in the left lower lobe concerning for disease recurrence. The patient underwent Robotic assisted left lower lobectomy and lymph node dissection to the 2 synchronous adenocarcinomas in the left lower lobe under the care of Dr. Roxan Hockey.  Performed on 12/16/2020. The patient is currently on observation and he is feeling fine with no concerning complaints. He had repeat CT scan of the chest performed recently.  I personally and independently reviewed the scan and discussed the result with the patient today. His scan showed no concerning findings for disease recurrence or metastasis. I recommended for the patient to continue on observation with repeat CT scan of the chest in 6 months. He was advised to call immediately if he has any other concerning symptoms in the interval. The patient voices understanding of current disease status and treatment options and is in agreement with the current care plan.  All questions were answered. The patient knows to call the clinic with any problems, questions or concerns. We can certainly see the patient much sooner if necessary.  Disclaimer: This note was dictated with voice recognition software. Similar sounding words can inadvertently be transcribed and may be missed upon review.

## 2022-10-11 ENCOUNTER — Encounter: Payer: Self-pay | Admitting: Gastroenterology

## 2022-10-23 ENCOUNTER — Encounter: Payer: Self-pay | Admitting: Gastroenterology

## 2022-12-08 ENCOUNTER — Ambulatory Visit (AMBULATORY_SURGERY_CENTER): Payer: Medicare PPO | Admitting: *Deleted

## 2022-12-08 VITALS — Ht 70.0 in | Wt 220.0 lb

## 2022-12-08 DIAGNOSIS — Z86010 Personal history of colonic polyps: Secondary | ICD-10-CM

## 2022-12-08 MED ORDER — NA SULFATE-K SULFATE-MG SULF 17.5-3.13-1.6 GM/177ML PO SOLN: 1.0000 | Freq: Once | ORAL | 0 refills | Status: AC

## 2022-12-08 NOTE — Progress Notes (Signed)

## 2022-12-13 ENCOUNTER — Telehealth: Payer: Self-pay

## 2022-12-13 NOTE — Patient Outreach (Signed)
  Care Coordination   12/13/2022 Name: Bobby [name] MRN: [id number] DOB: 1946-06-06   Care Coordination Outreach Attempts:  An unsuccessful telephone outreach was attempted today to offer the patient information about available care coordination services as a benefit of their health plan.   Follow Up Plan:  Additional outreach attempts will be made to offer the patient care coordination information and services.   Encounter Outcome:  No Answer   Care Coordination Interventions:  No, not indicated    Peter Garter RN, BSN,CCM, CDE Care Management Coordinator [facility] [phone]

## 2022-12-20 ENCOUNTER — Telehealth: Payer: Self-pay

## 2022-12-20 NOTE — Patient Instructions (Signed)
Visit Information  Thank you for taking time to visit with me today. Please don't hesitate to contact me if I can be of assistance to you.   Following are the goals we discussed today:   Goals Addressed             This Visit's Progress    COMPLETED: Care Coordination Activities - no follow up required       Care Coordination Interventions: Provided education to patient re: care coordination services, annual wellness visit Assessed social determinant of health barriers          If you are experiencing a Mental Health or Behavioral Health Crisis or need someone to talk to, please call the Suicide and Crisis Lifeline: 988 call the [place] National Suicide Prevention Lifeline: 769 227 9788 or TTY: 939-848-0038 TTY 509-843-2028) to talk to a trained counselor call 1-800-273-TALK (toll free, 24 hour hotline) go to [facility] [address] [place] [phone]) call 911   Patient verbalizes understanding of instructions and care plan provided today and agrees to view in MyChart. Active MyChart status and patient understanding of how to access instructions and care plan via MyChart confirmed with patient.     No further follow up required:    Dudley Major RN, Maximiano Coss, CDE Care Management Coordinator [facility] Management [phone]

## 2022-12-20 NOTE — Patient Outreach (Signed)
  Care Coordination   Initial Visit Note   12/20/2022 Name: ZAIRE LEVESQUE MRN: [id number] DOB: Nov 29, 1946  KEMAR PANDIT is a 77 y.o. year old male who sees [name], MD for primary care. I spoke with  Hershal Coria by phone today.  What matters to the patients health and wellness today?  No concerns today.  States he is to see his doctor in a few weeks and he  is also followed at the [place]    Goals Addressed             This Visit's Progress    COMPLETED: Care Coordination Activities - no follow up required       Care Coordination Interventions: Provided education to patient re: care coordination services, annual wellness visit Assessed social determinant of health barriers          SDOH assessments and interventions completed:  Yes  SDOH Interventions Today    Flowsheet Row Most Recent Value  SDOH Interventions   Food Insecurity Interventions Intervention Not Indicated  Housing Interventions Intervention Not Indicated  Transportation Interventions Intervention Not Indicated  Utilities Interventions Intervention Not Indicated        Care Coordination Interventions:  Yes, provided   Follow up plan: No further intervention required.   Encounter Outcome:  Pt. Visit Completed  Dudley Major RN, BSN,CCM, CDE Care Management Coordinator [facility] [phone]

## 2022-12-27 ENCOUNTER — Encounter: Payer: Medicare PPO | Admitting: Gastroenterology

## 2023-01-07 ENCOUNTER — Encounter: Payer: Self-pay | Admitting: Certified Registered Nurse Anesthetist

## 2023-01-12 ENCOUNTER — Ambulatory Visit (AMBULATORY_SURGERY_CENTER): Payer: Medicare Other | Admitting: Gastroenterology

## 2023-01-12 ENCOUNTER — Encounter: Payer: Self-pay | Admitting: Gastroenterology

## 2023-01-12 VITALS — BP 122/71 | HR 64 | Temp 97.8°F | Resp 20 | Ht 70.0 in | Wt 220.0 lb

## 2023-01-12 DIAGNOSIS — D123 Benign neoplasm of transverse colon: Secondary | ICD-10-CM | POA: Diagnosis not present

## 2023-01-12 DIAGNOSIS — Z86010 Personal history of colonic polyps: Secondary | ICD-10-CM | POA: Diagnosis not present

## 2023-01-12 DIAGNOSIS — Z09 Encounter for follow-up examination after completed treatment for conditions other than malignant neoplasm: Secondary | ICD-10-CM | POA: Diagnosis not present

## 2023-01-12 MED ORDER — SODIUM CHLORIDE 0.9 % IV SOLN: 500.0000 mL | Freq: Once | INTRAVENOUS | Status: DC

## 2023-01-12 NOTE — Patient Instructions (Signed)
   Handouts on polyps,diverticulosis,& hemorrhoids given to you today  Await pathology results on polyp removed      YOU HAD AN ENDOSCOPIC PROCEDURE TODAY AT [facility]:   Refer to the procedure report that was given to you for any specific questions about what was found during the examination.  If the procedure report does not answer your questions, please call your gastroenterologist to clarify.  If you requested that your care partner not be given the details of your procedure findings, then the procedure report has been included in a sealed envelope for you to review at your convenience later.  YOU SHOULD EXPECT: Some feelings of bloating in the abdomen. Passage of more gas than usual.  Walking can help get rid of the air that was put into your GI tract during the procedure and reduce the bloating. If you had a lower endoscopy (such as a colonoscopy or flexible sigmoidoscopy) you may notice spotting of blood in your stool or on the toilet paper. If you underwent a bowel prep for your procedure, you may not have a normal bowel movement for a few days.  Please Note:  You might notice some irritation and congestion in your nose or some drainage.  This is from the oxygen used during your procedure.  There is no need for concern and it should clear up in a day or so.  SYMPTOMS TO REPORT IMMEDIATELY:  Following lower endoscopy (colonoscopy or flexible sigmoidoscopy):  Excessive amounts of blood in the stool  Significant tenderness or worsening of abdominal pains  Swelling of the abdomen that is new, acute  Fever of 100F or higher   For urgent or emergent issues, a gastroenterologist can be reached at any hour by calling [phone]. Do not use MyChart messaging for urgent concerns.    DIET:  We do recommend a small meal at first, but then you may proceed to your regular diet.  Drink plenty of fluids but you should avoid alcoholic beverages for 24 hours.  ACTIVITY:   You should plan to take it easy for the rest of today and you should NOT DRIVE or use heavy machinery until tomorrow (because of the sedation medicines used during the test).    FOLLOW UP: Our staff will call the number listed on your records the next business day following your procedure.  We will call around 7:15- 8:00 am to check on you and address any questions or concerns that you may have regarding the information given to you following your procedure. If we do not reach you, we will leave a message.     If any biopsies were taken you will be contacted by phone or by letter within the next 1-3 weeks.  Please call us at [phone] if you have not heard about the biopsies in 3 weeks.    SIGNATURES/CONFIDENTIALITY: You and/or your care partner have signed paperwork which will be entered into your electronic medical record.  These signatures attest to the fact that that the information above on your After Visit Summary has been reviewed and is understood.  Full responsibility of the confidentiality of this discharge information lies with you and/or your care-partner.

## 2023-01-12 NOTE — Op Note (Signed)
[facility] Patient Name: Bobby [name] Procedure Date: 01/12/2023 7:47 AM MRN: [id number] Endoscopist: Remo Lipps P. Havery Moros , MD, [id number] Age: 77 Referring MD:  Date of Birth: [birth year] Gender: Male Account #: [id number] Procedure:                Colonoscopy Indications:              High risk colon cancer surveillance: Personal                            history of colonic polyps - history of 17 adenoms                            removed 2019, then 1 adenoma in 2020 Medicines:                Monitored Anesthesia Care Procedure:                Pre-Anesthesia Assessment:                           - Prior to the procedure, a History and Physical                            was performed, and patient medications and                            allergies were reviewed. [name] tolerance of                            previous anesthesia was also reviewed. The risks                            and benefits of the procedure and the sedation                            options and risks were discussed with the patient.                            All questions were answered, and informed consent                            was obtained. Prior Anticoagulants: The patient has                            taken no anticoagulant or antiplatelet agents. ASA                            Grade Assessment: III - A patient with severe                            systemic disease. After reviewing the risks and                            benefits, the patient was deemed in satisfactory  condition to undergo the procedure.                           After obtaining informed consent, the colonoscope                            was passed under direct vision. Throughout the                            procedure, the patient's blood pressure, pulse, and                            oxygen saturations were monitored continuously. The                            Olympus  CF-HQ190L [id number]) Colonoscope was                            introduced through the anus and advanced to the the                            cecum, identified by appendiceal orifice and                            ileocecal valve. The colonoscopy was performed                            without difficulty. The patient tolerated the                            procedure well. The quality of the bowel                            preparation was good. The ileocecal valve,                            appendiceal orifice, and rectum were photographed. Scope In: 7:59:54 AM Scope Out: 8:15:03 AM Scope Withdrawal Time: 0 hours 12 minutes 33 seconds  Total Procedure Duration: 0 hours 15 minutes 9 seconds  Findings:                 The perianal and digital rectal examinations were                            normal.                           A few small-mouthed diverticula were found in the                            transverse colon and left colon.                           A 4 mm polyp was found in the transverse colon. The  polyp was sessile. The polyp was removed with a                            cold snare. Resection and retrieval were complete.                           Anal papilla(e) were hypertrophied.                           Internal hemorrhoids were found during                            retroflexion. The hemorrhoids were small.                           The exam was otherwise without abnormality. Complications:            No immediate complications. Estimated blood loss:                            Minimal. Estimated Blood Loss:     Estimated blood loss was minimal. Impression:               - Diverticulosis in the transverse colon and in the                            left colon.                           - One 4 mm polyp in the transverse colon, removed                            with a cold snare. Resected and retrieved.                           - Anal  papilla(e) were hypertrophied.                           - Internal hemorrhoids.                           - The examination was otherwise normal. Recommendation:           - Patient has a contact number available for                            emergencies. The signs and symptoms of potential                            delayed complications were discussed with the                            patient. Return to normal activities tomorrow.                            Written discharge instructions were provided to the  patient.                           - Resume previous diet.                           - Continue present medications.                           - Await pathology results. Patient would not be due                            for another colonoscopy for 5 years, however at                            that time he will be 77 years old. Likely no                            further surveillance exams needed at that time                            given age, however can discuss with him then to see                            how he feels about it Carlota Raspberry. [name], MD 01/12/2023 8:19:31 AM This report has been signed electronically.

## 2023-01-12 NOTE — Progress Notes (Signed)
Report given to PACU, vss 

## 2023-01-12 NOTE — Progress Notes (Signed)
[facility] History and Physical   Primary Care Physician:  Seward Carol, MD   Reason for Procedure:   History of colon polyps  Plan:    colonoscopy     HPI: Bobby [name] is a 77 y.o. male  here for colonoscopy surveillance - history of 17 adenomas in 2019, 1 adenoma in 2020.   Patient denies any bowel symptoms at this time. No family history of colon cancer known. Otherwise feels well without any cardiopulmonary symptoms.   I have discussed risks / benefits of anesthesia and endoscopic procedure with Bobby [name] and they wish to proceed with the exams as outlined today.    Past Medical History:  Diagnosis Date   Arthritis    Right wrist   BPH (benign prostatic hypertrophy)    COPD (chronic obstructive pulmonary disease) (HCC)    emphysema   Frequency of urination    History of lung cancer    STAGE 1A NON-SMALL CELL LUNG CARCINOMA ---  04-10-2009   S/P RIGHT UPPER LOBECTOMY , NO CHEMORADIATION--  NO RECURRENCE  (ONCOLOGIST-- DR Julien Nordmann)   Hyperlipidemia    Hypertension    nscl ca dx'd 04/2009   lung   Pulmonary nodule seen on imaging study    STABLE LEFT LOWER LOBE NODULE  PER CHEST CT--  MONITORED BY DR [facility]   Sleep apnea    OSA does not use CPAP   Type 2 diabetes mellitus ([place])    Urgency of urination     Past Surgical History:  Procedure Laterality Date   CARDIOVASCULAR STRESS TEST  04-08-2009   NORMAL NUCLEAR STUDY/ NO ISCHEMIA/ EF 60%   CARPAL TUNNEL RELEASE Right    COLONOSCOPY  last 2019   CYST EXCISION Left 10/03/2018   Procedure: EXCISION OF LEFT AXILLARY SEBACEOUS CYST;  Surgeon: Coralie Keens, MD;  Location: [facility];  Service: General;  Laterality: Left;   excision of abcess     INTERCOSTAL NERVE BLOCK Left 12/16/2020   Procedure: INTERCOSTAL NERVE BLOCK;  Surgeon: Melrose Nakayama, MD;  Location: [facility];  Service: Thoracic;  Laterality: Left;   KNEE ARTHROSCOPY W/ MENISCECTOMY Left 10-31-2004   LEFT  SHOULDER ARTHROSCOPY/ ACROMIOPLASTY/ DECOMPRESSION/ SYNOVECTOMY  11-20-2007   LYMPH NODE DISSECTION Left 12/16/2020   Procedure: LYMPH NODE DISSECTION, left lung;  Surgeon: Melrose Nakayama, MD;  Location: [facility];  Service: Thoracic;  Laterality: Left;   POLYPECTOMY     TRANSURETHRAL RESECTION OF PROSTATE N/A 11/03/2013   Procedure: TRANSURETHRAL RESECTION OF THE PROSTATE WITH GYRUS INSTRUMENTS;  Surgeon: Franchot Gallo, MD;  Location: [facility];  Service: Urology;  Laterality: N/A;   TRIGGER FINGER RELEASE Right 2019   VIDEO ASSISTED THORACOSCOPY (VATS)/ LOBECTOMY Right 04-10-2009  DR Arlyce Dice   RIGHT UPPER LOBECTOMY AND NODE DISSECTION   VIDEO BRONCHOSCOPY WITH ENDOBRONCHIAL NAVIGATION N/A 12/02/2020   Procedure: VIDEO BRONCHOSCOPY WITH ENDOBRONCHIAL NAVIGATION;  Surgeon: Melrose Nakayama, MD;  Location: [facility];  Service: Thoracic;  Laterality: N/A;   XI ROBOTIC ASSISTED THORASCOPY-LEFT LOWER LOBECTOMY (Left  12/16/2020    Prior to Admission medications   Medication Sig Start Date End Date Taking? Authorizing Provider  allopurinol (ZYLOPRIM) 100 MG tablet Take 100 mg by mouth daily. 12/27/18  Yes [provider]  amLODipine-benazepril (LOTREL) 5-20 MG per capsule Take 1 capsule by mouth every morning.  01/26/13  Yes [provider]  Cholecalciferol (VITAMIN D3) 50 MCG (2000 UT) TABS Take 2,000 Units by mouth daily.   Yes  [provider]  colchicine 0.6 MG tablet Take 0.6 mg by mouth daily. 10/04/18  Yes [provider]  metFORMIN (GLUCOPHAGE) 500 MG tablet Take 500 mg by mouth 2 (two) times daily. 07/08/22  Yes [provider]  simvastatin (ZOCOR) 20 MG tablet Take 20 mg by mouth every evening. 08/08/15  Yes [provider]  aspirin EC 81 MG tablet Take 81 mg by mouth every evening. Patient not taking: Reported on 12/08/2022    [provider]  escitalopram (LEXAPRO) 10 MG tablet Take 10 mg by mouth  daily. Patient not taking: Reported on 01/12/2023    [provider]  fluticasone (FLONASE) 50 MCG/ACT nasal spray Place 1 spray into both nostrils daily as needed for allergies. 08/05/18   [provider]    Current Outpatient Medications  Medication Sig Dispense Refill   allopurinol (ZYLOPRIM) 100 MG tablet Take 100 mg by mouth daily.     amLODipine-benazepril (LOTREL) 5-20 MG per capsule Take 1 capsule by mouth every morning.      Cholecalciferol (VITAMIN D3) 50 MCG (2000 UT) TABS Take 2,000 Units by mouth daily.     colchicine 0.6 MG tablet Take 0.6 mg by mouth daily.     metFORMIN (GLUCOPHAGE) 500 MG tablet Take 500 mg by mouth 2 (two) times daily.     simvastatin (ZOCOR) 20 MG tablet Take 20 mg by mouth every evening.  1   aspirin EC 81 MG tablet Take 81 mg by mouth every evening. (Patient not taking: Reported on 12/08/2022)     escitalopram (LEXAPRO) 10 MG tablet Take 10 mg by mouth daily. (Patient not taking: Reported on 01/12/2023)     fluticasone (FLONASE) 50 MCG/ACT nasal spray Place 1 spray into both nostrils daily as needed for allergies.     Current Facility-Administered Medications  Medication Dose Route Frequency Provider Last Rate Last Admin   0.9 %  sodium chloride infusion  500 mL Intravenous Once Quintavia Rogstad, Carlota Raspberry, MD        Allergies as of 01/12/2023   (No Known Allergies)    Family History  Problem Relation Age of Onset   Colon polyps Brother    Colitis Neg Hx    Colon cancer Neg Hx    Esophageal cancer Neg Hx    Rectal cancer Neg Hx    Stomach cancer Neg Hx    Crohn's disease Neg Hx    Ulcerative colitis Neg Hx     Social History   Socioeconomic History   Marital status: Married    Spouse name: Not on file   Number of children: Not on file   Years of education: Not on file   Highest education level: Not on file  Occupational History   Not on file  Tobacco Use   Smoking status: Former    Years: 20.00    Types: Cigarettes    Quit  date: 05/28/2000    Years since quitting: 22.6   Smokeless tobacco: Never  Vaping Use   Vaping Use: Never used  Substance and Sexual Activity   Alcohol use: Not Currently   Drug use: No   Sexual activity: Not on file  Other Topics Concern   Not on file  Social History Narrative   Not on file   Social Determinants of Health   Financial Resource Strain: Not on file  Food Insecurity: No Food Insecurity (12/20/2022)   Hunger Vital Sign    Worried About Running Out of Food in the Last  Year: Never true    [place] in the Last Year: Never true  Transportation Needs: No Transportation Needs (12/20/2022)   PRAPARE - Hydrologist (Medical): No    Lack of Transportation (Non-Medical): No  Physical Activity: Not on file  Stress: Not on file  Social Connections: Not on file  Intimate Partner Violence: Not on file    Review of Systems: All other review of systems negative except as mentioned in the HPI.  Physical Exam: Vital signs BP 116/79   Pulse 68   Temp 97.8 F (36.6 C)   Ht 5\' 10"  (1.778 m)   Wt 220 lb (99.8 kg)   SpO2 98%   BMI 31.57 kg/m   General:   Alert,  Well-developed, pleasant and cooperative in NAD Lungs:  Clear throughout to auscultation.   Heart:  Regular rate and rhythm Abdomen:  Soft, nontender and nondistended.   Neuro/Psych:  Alert and cooperative. Normal mood and affect. A and O x 3  Jolly Mango, MD [facility]

## 2023-01-12 NOTE — Progress Notes (Signed)
Called to room to assist during endoscopic procedure.  Patient ID and intended procedure confirmed with present staff. Received instructions for my participation in the procedure from the performing physician.  

## 2023-01-12 NOTE — Progress Notes (Signed)
Pt's states no medical or surgical changes since previsit or office visit. 

## 2023-01-15 ENCOUNTER — Telehealth: Payer: Self-pay

## 2023-01-15 NOTE — Telephone Encounter (Signed)
  Follow up Call-     01/12/2023    7:36 AM  Call back number  Post procedure Call Back phone  # [phone]  Permission to leave phone message Yes     Patient questions:  Do you have a fever, pain , or abdominal swelling? No. Pain Score  0 *  Have you tolerated food without any problems? Yes.    Have you been able to return to your normal activities? Yes.    Do you have any questions about your discharge instructions: Diet   No. Medications  No. Follow up visit  No.  Do you have questions or concerns about your Care? No.  Actions: * If pain score is 4 or above: No action needed, pain <4.

## 2023-01-21 ENCOUNTER — Encounter: Payer: Self-pay | Admitting: Gastroenterology

## 2023-04-05 ENCOUNTER — Telehealth: Payer: Self-pay | Admitting: Internal Medicine

## 2023-04-05 NOTE — Telephone Encounter (Signed)
Called patient regarding upcoming May appointments, patient is notified.  ?

## 2023-04-06 ENCOUNTER — Ambulatory Visit (HOSPITAL_COMMUNITY)
Admission: RE | Admit: 2023-04-06 | Discharge: 2023-04-06 | Disposition: A | Discharge status: Home / Self Care | Payer: Medicare HMO | Source: Ambulatory Visit | Attending: Internal Medicine | Admitting: Internal Medicine

## 2023-04-06 ENCOUNTER — Other Ambulatory Visit: Payer: Self-pay

## 2023-04-06 ENCOUNTER — Inpatient Hospital Stay: Payer: Medicare HMO | Attending: Internal Medicine

## 2023-04-06 DIAGNOSIS — C3490 Malignant neoplasm of unspecified part of unspecified bronchus or lung: Secondary | ICD-10-CM

## 2023-04-06 DIAGNOSIS — Z85118 Personal history of other malignant neoplasm of bronchus and lung: Secondary | ICD-10-CM | POA: Insufficient documentation

## 2023-04-06 DIAGNOSIS — Z08 Encounter for follow-up examination after completed treatment for malignant neoplasm: Secondary | ICD-10-CM | POA: Insufficient documentation

## 2023-04-06 LAB — CBC WITH DIFFERENTIAL (CANCER CENTER ONLY)
Abs Immature Granulocytes: 0.02 10*3/uL (ref 0.00–0.07)
Basophils Absolute: 0 10*3/uL (ref 0.0–0.1)
Basophils Relative: 1 %
Eosinophils Absolute: 0.1 10*3/uL (ref 0.0–0.5)
Eosinophils Relative: 2 %
HCT: 41.6 % (ref 39.0–52.0)
Hemoglobin: 13.8 g/dL (ref 13.0–17.0)
Immature Granulocytes: 0 %
Lymphocytes Relative: 25 %
Lymphs Abs: 1.7 10*3/uL (ref 0.7–4.0)
MCH: 27.6 pg (ref 26.0–34.0)
MCHC: 33.2 g/dL (ref 30.0–36.0)
MCV: 83.2 fL (ref 80.0–100.0)
Monocytes Absolute: 0.6 10*3/uL (ref 0.1–1.0)
Monocytes Relative: 8 %
Neutro Abs: 4.4 10*3/uL (ref 1.7–7.7)
Neutrophils Relative %: 64 %
Platelet Count: 231 10*3/uL (ref 150–400)
RBC: 5 MIL/uL (ref 4.22–5.81)
RDW: 15.5 % (ref 11.5–15.5)
WBC Count: 6.9 10*3/uL (ref 4.0–10.5)
nRBC: 0 % (ref 0.0–0.2)

## 2023-04-06 LAB — CMP (CANCER CENTER ONLY)
ALT: 45 U/L — ABNORMAL HIGH (ref 0–44)
AST: 34 U/L (ref 15–41)
Albumin: 4 g/dL (ref 3.5–5.0)
Alkaline Phosphatase: 56 U/L (ref 38–126)
Anion gap: 9 (ref 5–15)
BUN: 17 mg/dL (ref 8–23)
CO2: 23 mmol/L (ref 22–32)
Calcium: 8.9 mg/dL (ref 8.9–10.3)
Chloride: 108 mmol/L (ref 98–111)
Creatinine: 0.95 mg/dL (ref 0.61–1.24)
GFR, Estimated: >60 mL/min (ref >60)
Glucose, Bld: 172 mg/dL — ABNORMAL HIGH (ref 70–99)
Potassium: 4 mmol/L (ref 3.5–5.1)
Sodium: 140 mmol/L (ref 135–145)
Total Bilirubin: 0.4 mg/dL (ref 0.3–1.2)
Total Protein: 6.8 g/dL (ref 6.5–8.1)

## 2023-04-06 MED ORDER — SODIUM CHLORIDE (PF) 0.9 % IJ SOLN
INTRAMUSCULAR | Status: AC
  Filled 2023-04-06: qty 50

## 2023-04-06 MED ORDER — IOHEXOL 300 MG/ML  SOLN
75.0000 mL | Freq: Once | INTRAMUSCULAR | Status: AC | PRN
  Administered 2023-04-06: 75 mL via INTRAVENOUS

## 2023-04-10 ENCOUNTER — Inpatient Hospital Stay (HOSPITAL_BASED_OUTPATIENT_CLINIC_OR_DEPARTMENT_OTHER): Payer: Medicare HMO | Admitting: Internal Medicine

## 2023-04-10 VITALS — BP 147/78 | HR 77 | Temp 98.4°F | Resp 15 | Wt 227.3 lb

## 2023-04-10 DIAGNOSIS — Z08 Encounter for follow-up examination after completed treatment for malignant neoplasm: Secondary | ICD-10-CM | POA: Diagnosis present

## 2023-04-10 DIAGNOSIS — Z85118 Personal history of other malignant neoplasm of bronchus and lung: Secondary | ICD-10-CM | POA: Diagnosis present

## 2023-04-10 DIAGNOSIS — C3490 Malignant neoplasm of unspecified part of unspecified bronchus or lung: Secondary | ICD-10-CM

## 2023-04-10 NOTE — Progress Notes (Signed)
[facility] [phone]   [phone]  OFFICE PROGRESS NOTE  Bobby Dills, MD 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   PRIOR THERAPY:  1) Status post right upper lobectomy on Apr 30, 2009 under the care of Dr. Edwyna Shell.  2) Robotic assisted left lower lobectomy and lymph node dissection to the 2 synchronous adenocarcinomas in the left lower lobe under the care of Dr. Dorris Fetch.  Performed on 12/16/2020.  CURRENT THERAPY: Observation.  INTERVAL HISTORY: Bobby [name] 77 y.o. male returns to the clinic today for follow-up visit.  The patient is feeling fine today with no concerning complaints.  He denied having any current chest pain, shortness of breath, cough or hemoptysis.  He has no nausea, vomiting, diarrhea or constipation.  He has no headache or visual changes.  He denied having any recent weight loss or night sweats.  He is here today for evaluation with repeat CT scan of the chest for restaging of his disease.  MEDICAL HISTORY: Past Medical History:  Diagnosis Date   Arthritis    Right wrist   BPH (benign prostatic hypertrophy)    COPD (chronic obstructive pulmonary disease) (HCC)    emphysema   Frequency of urination    History of lung cancer    STAGE 1A NON-SMALL CELL LUNG CARCINOMA ---  04-10-2009   S/P RIGHT UPPER LOBECTOMY , NO CHEMORADIATION--  NO RECURRENCE  (ONCOLOGIST-- DR Arbutus Ped)   Hyperlipidemia    Hypertension    nscl ca dx'd 04/2009   lung   Pulmonary nodule seen on imaging study    STABLE LEFT LOWER LOBE NODULE  PER CHEST CT--  MONITORED BY DR [facility]   Sleep apnea    OSA does not use CPAP   Type 2 diabetes mellitus (HCC)    Urgency of urination     ALLERGIES:  has No Known Allergies.  MEDICATIONS:  Current Outpatient Medications  Medication Sig Dispense  Refill   allopurinol (ZYLOPRIM) 100 MG tablet Take 100 mg by mouth daily.     amLODipine-benazepril (LOTREL) 5-20 MG per capsule Take 1 capsule by mouth every morning.      aspirin EC 81 MG tablet Take 81 mg by mouth every evening. (Patient not taking: Reported on 12/08/2022)     Cholecalciferol (VITAMIN D3) 50 MCG (2000 UT) TABS Take 2,000 Units by mouth daily.     colchicine 0.6 MG tablet Take 0.6 mg by mouth daily.     escitalopram (LEXAPRO) 10 MG tablet Take 10 mg by mouth daily. (Patient not taking: Reported on 01/12/2023)     fluticasone (FLONASE) 50 MCG/ACT nasal spray Place 1 spray into both nostrils daily as needed for allergies.     metFORMIN (GLUCOPHAGE) 500 MG tablet Take 500 mg by mouth 2 (two) times daily.     simvastatin (ZOCOR) 20 MG tablet Take 20 mg by mouth every evening.  1   No current facility-administered medications for this visit.    SURGICAL HISTORY:  Past Surgical History:  Procedure Laterality Date   CARDIOVASCULAR STRESS TEST  04-08-2009   NORMAL NUCLEAR STUDY/ NO ISCHEMIA/ EF 60%   CARPAL TUNNEL RELEASE Right    COLONOSCOPY  last 2019   CYST EXCISION Left 10/03/2018   Procedure: EXCISION OF LEFT AXILLARY SEBACEOUS CYST;  Surgeon: Abigail Miyamoto, MD;  Location: [facility];  Service: General;  Laterality: Left;   excision of abcess     INTERCOSTAL NERVE BLOCK Left 12/16/2020   Procedure: INTERCOSTAL NERVE BLOCK;  Surgeon: Loreli Slot, MD;  Location: [facility];  Service: Thoracic;  Laterality: Left;   KNEE ARTHROSCOPY W/ MENISCECTOMY Left 10-31-2004   LEFT SHOULDER ARTHROSCOPY/ ACROMIOPLASTY/ DECOMPRESSION/ SYNOVECTOMY  11-20-2007   LYMPH NODE DISSECTION Left 12/16/2020   Procedure: LYMPH NODE DISSECTION, left lung;  Surgeon: Loreli Slot, MD;  Location: [facility];  Service: Thoracic;  Laterality: Left;   POLYPECTOMY     TRANSURETHRAL RESECTION OF PROSTATE N/A 11/03/2013   Procedure: TRANSURETHRAL RESECTION OF THE PROSTATE WITH GYRUS  INSTRUMENTS;  Surgeon: Marcine Matar, MD;  Location: [facility];  Service: Urology;  Laterality: N/A;   TRIGGER FINGER RELEASE Right 2019   VIDEO ASSISTED THORACOSCOPY (VATS)/ LOBECTOMY Right 04-10-2009  DR Edwyna Shell   RIGHT UPPER LOBECTOMY AND NODE DISSECTION   VIDEO BRONCHOSCOPY WITH ENDOBRONCHIAL NAVIGATION N/A 12/02/2020   Procedure: VIDEO BRONCHOSCOPY WITH ENDOBRONCHIAL NAVIGATION;  Surgeon: Loreli Slot, MD;  Location: [facility];  Service: Thoracic;  Laterality: N/A;   XI ROBOTIC ASSISTED THORASCOPY-LEFT LOWER LOBECTOMY (Left  12/16/2020    REVIEW OF SYSTEMS:  A comprehensive review of systems was negative.   PHYSICAL EXAMINATION: General appearance: alert, cooperative, and no distress Head: Normocephalic, without obvious abnormality, atraumatic Neck: no adenopathy, supple, symmetrical, trachea midline, and thyroid not enlarged, symmetric, no tenderness/mass/nodules Lymph nodes: Cervical, supraclavicular, and axillary nodes normal. Resp: clear to auscultation bilaterally and normal percussion bilaterally Back: symmetric, no curvature. ROM normal. No CVA tenderness. Cardio: regular rate and rhythm, S1, S2 normal, no murmur, click, rub or gallop GI: soft, non-tender; bowel sounds normal; no masses,  no organomegaly Extremities: extremities normal, atraumatic, no cyanosis or edema  ECOG PERFORMANCE STATUS: 0 - Asymptomatic  Blood pressure (!) 147/78, pulse 77, temperature 98.4 F (36.9 C), resp. rate 15, weight 227 lb 4.8 oz (103.1 kg), SpO2 96 %.  LABORATORY DATA: Lab Results  Component Value Date   WBC 6.9 04/06/2023   HGB 13.8 04/06/2023   HCT 41.6 04/06/2023   MCV 83.2 04/06/2023   PLT 231 04/06/2023      Chemistry      Component Value Date/Time   NA 140 04/06/2023 0951   NA 142 08/24/2015 1100   K 4.0 04/06/2023 0951   K 4.0 08/24/2015 1100   CL 108 04/06/2023 0951   CL 108 (H) 02/11/2013 0811   CO2 23 04/06/2023 0951   CO2 26 08/24/2015  1100   BUN 17 04/06/2023 0951   BUN 10.2 08/24/2015 1100   CREATININE 0.95 04/06/2023 0951   CREATININE 0.8 08/24/2015 1100      Component Value Date/Time   CALCIUM 8.9 04/06/2023 0951   CALCIUM 9.0 08/24/2015 1100   ALKPHOS 56 04/06/2023 0951   ALKPHOS 63 08/24/2015 1100   AST 34 04/06/2023 0951   AST 29 08/24/2015 1100   ALT 45 (H) 04/06/2023 0951   ALT 41 08/24/2015 1100   BILITOT 0.4 04/06/2023 0951   BILITOT 0.25 08/24/2015 1100       RADIOGRAPHIC STUDIES: CT Chest W Contrast  Result Date: 04/10/2023 CLINICAL DATA:  Non-small-cell lung cancer staging. * Tracking Code: BO * EXAM: CT CHEST WITH CONTRAST TECHNIQUE: Multidetector CT imaging of the chest was performed during intravenous contrast administration. RADIATION DOSE REDUCTION: This exam was performed according to the departmental dose-optimization program which includes automated exposure control, adjustment of the mA  and/or kV according to patient size and/or use of iterative reconstruction technique. CONTRAST:  75mL OMNIPAQUE IOHEXOL 300 MG/ML  SOLN COMPARISON:  09/28/2022 and older FINDINGS: Cardiovascular: Trace pericardial fluid. Heart is nonenlarged. Normal caliber thoracic aorta with some mild vascular calcifications. Coronary artery calcifications are seen. Mediastinum/Nodes: Slightly patulous thoracic esophagus. Small thyroid gland. No specific abnormal lymph node enlargement identified in the axillary region, hilum on the right. Small left hilar node is stable measuring 8 mm in short axis on series 2, image 66. No abnormal lymph nodes in the mediastinum. Lungs/Pleura: Once again there is postsurgical changes in the right lung from prior upper lobectomy. Areas of pleural thickening and some lung distortion. Left lung also has some peripheral areas of interstitial septal thickening, some scarring and fibrotic change. No new consolidation, pneumothorax, effusion. No dominant new lung nodule. Upper Abdomen: Low-attenuation  left-sided adrenal nodules again seen measuring 9 mm with Hounsfield unit units of less than 10. Benign adenoma. Right adrenal gland is preserved. Musculoskeletal: Scattered degenerative changes along the spine. IMPRESSION: No significant interval change. Stable surgical changes from right-sided lobectomy. No developing new mass lesion, fluid collection or lymph node enlargement. Aortic Atherosclerosis (ICD10-I70.0). Electronically Signed   By: Karen Kays M.D.   On: 04/10/2023 10:08     ASSESSMENT AND PLAN: This is a very pleasant 77 years old [place] American male with stage IA non-small cell lung cancer status post right upper lobectomy and has been observation since May of 2010 daily with no evidence for disease recurrence. He was found on recent CT scan of the chest to have enlarging subsolid central left lower lobe as well as groundglass opacity in the left lower lobe concerning for disease recurrence. The patient underwent Robotic assisted left lower lobectomy and lymph node dissection to the 2 synchronous adenocarcinomas in the left lower lobe under the care of Dr. Dorris Fetch.  Performed on 12/16/2020. The patient is currently on observation and he is feeling fine with no concerning complaints. He had repeat CT scan of the chest performed recently.  I personally and independently reviewed the scan and discussed the result with the patient today. His scan showed no concerning findings for disease recurrence or metastasis. I recommended for him to continue on observation with repeat CT scan of the chest in 6 months. He was advised to call immediately if he has any concerning symptoms in the interval. The patient voices understanding of current disease status and treatment options and is in agreement with the current care plan.  All questions were answered. The patient knows to call the clinic with any problems, questions or concerns. We can certainly see the patient much sooner if  necessary.  Disclaimer: This note was dictated with voice recognition software. Similar sounding words can inadvertently be transcribed and may be missed upon review.

## 2023-10-08 ENCOUNTER — Inpatient Hospital Stay: Payer: 59 | Attending: Internal Medicine

## 2023-10-08 ENCOUNTER — Ambulatory Visit (HOSPITAL_COMMUNITY)
Admission: RE | Admit: 2023-10-08 | Discharge: 2023-10-08 | Disposition: A | Discharge status: Home / Self Care | Payer: Medicare HMO | Source: Ambulatory Visit | Attending: Internal Medicine | Admitting: Internal Medicine

## 2023-10-08 DIAGNOSIS — C3490 Malignant neoplasm of unspecified part of unspecified bronchus or lung: Secondary | ICD-10-CM | POA: Diagnosis present

## 2023-10-08 DIAGNOSIS — Z85118 Personal history of other malignant neoplasm of bronchus and lung: Secondary | ICD-10-CM | POA: Diagnosis present

## 2023-10-08 LAB — CMP (CANCER CENTER ONLY)
ALT: 36 U/L (ref 0–44)
AST: 30 U/L (ref 15–41)
Albumin: 3.9 g/dL (ref 3.5–5.0)
Alkaline Phosphatase: 58 U/L (ref 38–126)
Anion gap: 7 (ref 5–15)
BUN: 22 mg/dL (ref 8–23)
CO2: 25 mmol/L (ref 22–32)
Calcium: 9.3 mg/dL (ref 8.9–10.3)
Chloride: 107 mmol/L (ref 98–111)
Creatinine: 1 mg/dL (ref 0.61–1.24)
GFR, Estimated: >60 mL/min (ref >60)
Glucose, Bld: 132 mg/dL — ABNORMAL HIGH (ref 70–99)
Potassium: 4 mmol/L (ref 3.5–5.1)
Sodium: 139 mmol/L (ref 135–145)
Total Bilirubin: 0.4 mg/dL (ref <1.2)
Total Protein: 6.9 g/dL (ref 6.5–8.1)

## 2023-10-08 LAB — CBC WITH DIFFERENTIAL (CANCER CENTER ONLY)
Abs Immature Granulocytes: 0.03 10*3/uL (ref 0.00–0.07)
Basophils Absolute: 0 10*3/uL (ref 0.0–0.1)
Basophils Relative: 1 %
Eosinophils Absolute: 0.2 10*3/uL (ref 0.0–0.5)
Eosinophils Relative: 2 %
HCT: 42.4 % (ref 39.0–52.0)
Hemoglobin: 13.6 g/dL (ref 13.0–17.0)
Immature Granulocytes: 0 %
Lymphocytes Relative: 28 %
Lymphs Abs: 2.1 10*3/uL (ref 0.7–4.0)
MCH: 26.8 pg (ref 26.0–34.0)
MCHC: 32.1 g/dL (ref 30.0–36.0)
MCV: 83.6 fL (ref 80.0–100.0)
Monocytes Absolute: 0.6 10*3/uL (ref 0.1–1.0)
Monocytes Relative: 8 %
Neutro Abs: 4.6 10*3/uL (ref 1.7–7.7)
Neutrophils Relative %: 61 %
Platelet Count: 249 10*3/uL (ref 150–400)
RBC: 5.07 MIL/uL (ref 4.22–5.81)
RDW: 16 % — ABNORMAL HIGH (ref 11.5–15.5)
WBC Count: 7.5 10*3/uL (ref 4.0–10.5)
nRBC: 0 % (ref 0.0–0.2)

## 2023-10-08 MED ORDER — IOHEXOL 300 MG/ML  SOLN
75.0000 mL | Freq: Once | INTRAMUSCULAR | Status: AC | PRN
  Administered 2023-10-08: 75 mL via INTRAVENOUS

## 2023-10-10 ENCOUNTER — Inpatient Hospital Stay: Payer: 59 | Admitting: Internal Medicine

## 2023-10-15 ENCOUNTER — Telehealth: Payer: Self-pay | Admitting: Internal Medicine

## 2023-10-15 NOTE — Telephone Encounter (Signed)
Left patient a message in regards to scheduled appointment times/dates due to previous missed appointment

## 2023-11-08 ENCOUNTER — Inpatient Hospital Stay: Payer: 59 | Attending: Internal Medicine | Admitting: Internal Medicine

## 2023-11-08 VITALS — BP 136/74 | HR 70 | Temp 98.4°F | Resp 17 | Ht 70.0 in | Wt 227.1 lb

## 2023-11-08 DIAGNOSIS — Z85118 Personal history of other malignant neoplasm of bronchus and lung: Secondary | ICD-10-CM | POA: Diagnosis present

## 2023-11-08 DIAGNOSIS — C3490 Malignant neoplasm of unspecified part of unspecified bronchus or lung: Secondary | ICD-10-CM

## 2023-11-08 DIAGNOSIS — Z08 Encounter for follow-up examination after completed treatment for malignant neoplasm: Secondary | ICD-10-CM | POA: Diagnosis present

## 2023-11-08 NOTE — Progress Notes (Signed)
[facility] Telephone:(336) [phone]   Fax:([phone]) [phone]  OFFICE PROGRESS NOTE  [name], MD [address] [employer] [place] [address]  PRINCIPAL DIAGNOSIS: Stage IA (T1a N0 M0) non-small cell lung cancer, adenocarcinoma with bronchoalveolar features diagnosed in May 2010.   PRIOR THERAPY:  1) Status post right upper lobectomy on Apr 30, 2009 under the care of Dr. Edwyna Shell.  2) Robotic assisted left lower lobectomy and lymph node dissection to the 2 synchronous adenocarcinomas in the left lower lobe under the care of Dr. Dorris Fetch.  Performed on 12/16/2020.  CURRENT THERAPY: Observation.  INTERVAL HISTORY: Bobby [name] 77 y.o. male returns to the clinic today for 59-month follow-up visit.Discussed the use of AI scribe software for clinical note transcription with the patient, who gave verbal consent to proceed.  History of Present Illness   Bobby [name], a 77 year old patient with a history of adenocarcinoma, initially diagnosed in 2010, underwent a right upper lobectomy for a stage 1A tumor. In November 2022, three nodules were discovered in the left lower lobe of the lung, which were also resected and confirmed to be adenocarcinoma. Since then, the patient has been under observation with six-monthly follow-ups for the past two years.  The patient reports feeling well with no new complaints. He denies experiencing any chest pain, shortness of breath, cough, nausea, vomiting, diarrhea, headaches, or weight loss. [name] condition appears stable with no signs of active neoplastic disease in the chest. The patient also has a long-standing diagnosis of hepatic steatosis.       MEDICAL HISTORY: Past Medical History:  Diagnosis Date   Arthritis    Right wrist   BPH (benign prostatic hypertrophy)    COPD (chronic obstructive pulmonary disease) (HCC)    emphysema   Frequency of urination    History of lung cancer    STAGE 1A NON-SMALL CELL LUNG  CARCINOMA ---  04-10-2009   S/P RIGHT UPPER LOBECTOMY , NO CHEMORADIATION--  NO RECURRENCE  (ONCOLOGIST-- DR Arbutus Ped)   Hyperlipidemia    Hypertension    nscl ca dx'd 04/2009   lung   Pulmonary nodule seen on imaging study    STABLE LEFT LOWER LOBE NODULE  PER CHEST CT--  MONITORED BY DR [facility]   Sleep apnea    OSA does not use CPAP   Type 2 diabetes mellitus (HCC)    Urgency of urination     ALLERGIES:  has No Known Allergies.  MEDICATIONS:  Current Outpatient Medications  Medication Sig Dispense Refill   allopurinol (ZYLOPRIM) 100 MG tablet Take 100 mg by mouth daily.     amLODipine-benazepril (LOTREL) 5-20 MG per capsule Take 1 capsule by mouth every morning.      aspirin EC 81 MG tablet Take 81 mg by mouth every evening. (Patient not taking: Reported on 12/08/2022)     Cholecalciferol (VITAMIN D3) 50 MCG (2000 UT) TABS Take 2,000 Units by mouth daily.     colchicine 0.6 MG tablet Take 0.6 mg by mouth daily.     escitalopram (LEXAPRO) 10 MG tablet Take 10 mg by mouth daily. (Patient not taking: Reported on 01/12/2023)     fluticasone (FLONASE) 50 MCG/ACT nasal spray Place 1 spray into both nostrils daily as needed for allergies.     metFORMIN (GLUCOPHAGE) 500 MG tablet Take 500 mg by mouth 2 (two) times daily.     simvastatin (ZOCOR) 20 MG tablet Take 20 mg by mouth every evening.  1  No current facility-administered medications for this visit.    SURGICAL HISTORY:  Past Surgical History:  Procedure Laterality Date   CARDIOVASCULAR STRESS TEST  04-08-2009   NORMAL NUCLEAR STUDY/ NO ISCHEMIA/ EF 60%   CARPAL TUNNEL RELEASE Right    COLONOSCOPY  last 2019   CYST EXCISION Left 10/03/2018   Procedure: EXCISION OF LEFT AXILLARY SEBACEOUS CYST;  Surgeon: Abigail Miyamoto, MD;  Location: [facility];  Service: General;  Laterality: Left;   excision of abcess     INTERCOSTAL NERVE BLOCK Left 12/16/2020   Procedure: INTERCOSTAL NERVE BLOCK;  Surgeon: Loreli Slot, MD;  Location: [facility] OR;  Service: Thoracic;  Laterality: Left;   KNEE ARTHROSCOPY W/ MENISCECTOMY Left 10-31-2004   LEFT SHOULDER ARTHROSCOPY/ ACROMIOPLASTY/ DECOMPRESSION/ SYNOVECTOMY  11-20-2007   LYMPH NODE DISSECTION Left 12/16/2020   Procedure: LYMPH NODE DISSECTION, left lung;  Surgeon: Loreli Slot, MD;  Location: [facility] OR;  Service: Thoracic;  Laterality: Left;   POLYPECTOMY     TRANSURETHRAL RESECTION OF PROSTATE N/A 11/03/2013   Procedure: TRANSURETHRAL RESECTION OF THE PROSTATE WITH GYRUS INSTRUMENTS;  Surgeon: Marcine Matar, MD;  [facility];  Service: Urology;  Laterality: N/A;   TRIGGER FINGER RELEASE Right 2019   VIDEO ASSISTED THORACOSCOPY (VATS)/ LOBECTOMY Right 04-10-2009  DR Edwyna Shell   RIGHT UPPER LOBECTOMY AND NODE DISSECTION   VIDEO BRONCHOSCOPY WITH ENDOBRONCHIAL NAVIGATION N/A 12/02/2020   Procedure: VIDEO BRONCHOSCOPY WITH ENDOBRONCHIAL NAVIGATION;  Surgeon: Loreli Slot, MD;  Location: [facility] OR;  Service: Thoracic;  Laterality: N/A;   XI ROBOTIC ASSISTED THORASCOPY-LEFT LOWER LOBECTOMY (Left  12/16/2020    REVIEW OF SYSTEMS:  A comprehensive review of systems was negative.   PHYSICAL EXAMINATION: General appearance: alert, cooperative, and no distress Head: Normocephalic, without obvious abnormality, atraumatic Neck: no adenopathy, supple, symmetrical, trachea midline, and thyroid not enlarged, symmetric, no tenderness/mass/nodules Lymph nodes: Cervical, supraclavicular, and axillary nodes normal. Resp: clear to auscultation bilaterally and normal percussion bilaterally Back: symmetric, no curvature. ROM normal. No CVA tenderness. Cardio: regular rate and rhythm, S1, S2 normal, no murmur, click, rub or gallop GI: soft, non-tender; bowel sounds normal; no masses,  no organomegaly Extremities: extremities normal, atraumatic, no cyanosis or edema  ECOG PERFORMANCE STATUS: 0 - Asymptomatic  Blood pressure 136/74, pulse 70,  temperature 98.4 F (36.9 C), temperature source Temporal, resp. rate 17, height 5\' 10"  (1.778 m), weight 227 lb 1.6 oz (103 kg), SpO2 100%.  LABORATORY DATA: Lab Results  Component Value Date   WBC 7.5 10/08/2023   HGB 13.6 10/08/2023   HCT 42.4 10/08/2023   MCV 83.6 10/08/2023   PLT 249 10/08/2023      Chemistry      Component Value Date/Time   NA 139 10/08/2023 0904   NA 142 08/24/2015 1100   K 4.0 10/08/2023 0904   K 4.0 08/24/2015 1100   CL 107 10/08/2023 0904   CL 108 (H) 02/11/2013 0811   CO2 25 10/08/2023 0904   CO2 26 08/24/2015 1100   BUN 22 10/08/2023 0904   BUN 10.2 08/24/2015 1100   CREATININE 1.00 10/08/2023 0904   CREATININE 0.8 08/24/2015 1100      Component Value Date/Time   CALCIUM 9.3 10/08/2023 0904   CALCIUM 9.0 08/24/2015 1100   ALKPHOS 58 10/08/2023 0904   ALKPHOS 63 08/24/2015 1100   AST 30 10/08/2023 0904   AST 29 08/24/2015 1100   ALT 36 10/08/2023 0904   ALT 41 08/24/2015 1100  BILITOT 0.4 10/08/2023 0904   BILITOT 0.25 08/24/2015 1100       RADIOGRAPHIC STUDIES: No results found.   ASSESSMENT AND PLAN: This is a very pleasant 77 years old African American male with stage IA non-small cell lung cancer status post right upper lobectomy and has been observation since May of 2010 daily with no evidence for disease recurrence. He was found on recent CT scan of the chest to have enlarging subsolid central left lower lobe as well as groundglass opacity in the left lower lobe concerning for disease recurrence. The patient underwent Robotic assisted left lower lobectomy and lymph node dissection to the 2 synchronous adenocarcinomas in the left lower lobe under the care of Dr. Dorris Fetch.  Performed on 12/16/2020. The patient has been in observation since that time.  He had repeat CT scan of the chest performed recently.  I personally and independently reviewed the scan and discussed the result with the patient today.    Adenocarcinoma of the  Lung Adenocarcinoma of the lung, initially diagnosed in 2010 with Stage 1A lesion treated with right upper lobectomy. In November 2022, 2 nodules in the left lower lobe were resected and confirmed as adenocarcinoma. Under observation with six-monthly scans for the past two years. Latest scan shows no evidence of active neoplastic disease. Asymptomatic with no new symptoms. - Move to annual scans - Schedule next scan and lab work one week before December 2025 visit  Hepatic Steatosis Hepatic steatosis, asymptomatic and well-managed. - Continue monitoring.   The patient was advised to call immediately if he has any concerning symptoms in the interval. The patient voices understanding of current disease status and treatment options and is in agreement with the current care plan.  All questions were answered. The patient knows to call the clinic with any problems, questions or concerns. We can certainly see the patient much sooner if necessary.  Disclaimer: This note was dictated with voice recognition software. Similar sounding words can inadvertently be transcribed and may be missed upon review.

## 2024-02-06 ENCOUNTER — Other Ambulatory Visit (INDEPENDENT_AMBULATORY_CARE_PROVIDER_SITE_OTHER): Payer: Self-pay

## 2024-02-06 ENCOUNTER — Ambulatory Visit (INDEPENDENT_AMBULATORY_CARE_PROVIDER_SITE_OTHER): Admitting: Physician Assistant

## 2024-02-06 ENCOUNTER — Encounter: Payer: Self-pay | Admitting: Physician Assistant

## 2024-02-06 DIAGNOSIS — M5441 Lumbago with sciatica, right side: Secondary | ICD-10-CM | POA: Diagnosis not present

## 2024-02-06 DIAGNOSIS — G8929 Other chronic pain: Secondary | ICD-10-CM

## 2024-02-06 DIAGNOSIS — M25551 Pain in right hip: Secondary | ICD-10-CM | POA: Diagnosis not present

## 2024-02-06 MED ORDER — METHOCARBAMOL 750 MG PO TABS: 750.0000 mg | ORAL_TABLET | Freq: Two times a day (BID) | ORAL | 1 refills | Status: AC | PRN

## 2024-02-06 MED ORDER — METHYLPREDNISOLONE 4 MG PO TBPK: ORAL_TABLET | ORAL | 2 refills | Status: DC

## 2024-02-06 NOTE — Progress Notes (Signed)
 Office Visit Note   Patient: Bobby [name]           Date of Birth: 12-11-1945           MRN: [id number] Visit Date: 02/06/2024              Requested by: [name], MD [address] [place],  [place] [address] PCP: [name], MD   Assessment & Plan: Visit Diagnoses:  1. Chronic right-sided low back pain with right-sided sciatica     Plan: Impression is right-sided low back pain with right lower extremity radiculopathy.  At this point, we have discussed starting him on a steroid pack muscle relaxer and physical therapy.  Referral has been made.  He will follow-up with [place] as needed.  Call with concerns or questions.  Follow-Up Instructions: Return if symptoms worsen or fail to improve.   Orders:  Orders Placed This Encounter  Procedures   XR Lumbar Spine 2-3 Views   Ambulatory referral to Physical Therapy   Meds ordered this encounter  Medications   methylPREDNISolone (MEDROL DOSEPAK) 4 MG TBPK tablet    Sig: Take as directed    Dispense:  21 tablet    Refill:  2   methocarbamol (ROBAXIN-750) 750 MG tablet    Sig: Take 1 tablet (750 mg total) by mouth 2 (two) times daily as needed for muscle spasms.    Dispense:  20 tablet    Refill:  1      Procedures: No procedures performed   Clinical Data: No additional findings.   Subjective: Chief Complaint  Patient presents with   Right Hip - Pain    HPI patient is a pleasant 78 year old gentleman who comes in today with right low back pain radiating to the right buttock and lateral hip.  No injury or change in activity.  Symptoms are constant.  Pain is worse when he is moving his leg while driving.  He denies any weakness to the right lower extremity.  He has been taking BC powder and Tylenol without significant relief.  He does note paresthesias that radiate down the posterior lateral leg.  No bowel or bladder change or saddle paresthesias.  Review of Systems as detailed in HPI.  All others  reviewed and are negative.   Objective: Vital Signs: There were no vitals taken for this visit.  Physical Exam well-developed well-nourished gentleman in no acute distress.  Alert and oriented x 3.  Ortho Exam lumbar spine exam: He does have diffuse tenderness throughout the lower lumbar spine and into the right paraspinous musculature.  He has pain with straight leg raise.  Increased pain with lumbar flexion, extension and rotation.  No focal weakness.  He is neurovascularly intact distally.  Specialty Comments:  No specialty comments available.  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/21/2017   Trigger finger, left middle finger 06/21/2017   Right hand pain 01/18/2017   Hypertrophy of prostate with urinary obstruction and other lower urinary tract symptoms (LUTS) 11/03/2013   Cancer of upper lobe of right lung (HCC) 06/27/2012   OBESITY, UNSPECIFIED 04/08/2009   DIZZINESS 04/08/2009   DIABETES MELLITUS, TYPE II, CONTROLLED 04/07/2009  HYPERLIPIDEMIA 04/07/2009   Essential hypertension 04/07/2009   Past Medical History:  Diagnosis Date   Arthritis    Right wrist   BPH (benign prostatic hypertrophy)    COPD (chronic obstructive pulmonary disease) (HCC)    emphysema   Frequency of urination    History of lung cancer    STAGE 1A NON-SMALL CELL LUNG CARCINOMA ---  04-10-2009   S/P RIGHT UPPER LOBECTOMY , NO CHEMORADIATION--  NO RECURRENCE  (ONCOLOGIST-- DR Arbutus Ped)   Hyperlipidemia    Hypertension    nscl ca dx'd 04/2009   lung   Pulmonary nodule seen on imaging study    STABLE LEFT LOWER LOBE NODULE  PER CHEST CT--  MONITORED BY DR [facility]   Sleep apnea    OSA  does not use CPAP   Type 2 diabetes mellitus (HCC)    Urgency of urination     Family History  Problem Relation Age of Onset   Colon polyps Brother    Colitis Neg Hx    Colon cancer Neg Hx    Esophageal cancer Neg Hx    Rectal cancer Neg Hx    Stomach cancer Neg Hx    Crohn's disease Neg Hx    Ulcerative colitis Neg Hx     Past Surgical History:  Procedure Laterality Date   CARDIOVASCULAR STRESS TEST  04-08-2009   NORMAL NUCLEAR STUDY/ NO ISCHEMIA/ EF 60%   CARPAL TUNNEL RELEASE Right    COLONOSCOPY  last 2019   CYST EXCISION Left 10/03/2018   Procedure: EXCISION OF LEFT AXILLARY SEBACEOUS CYST;  Surgeon: Abigail Miyamoto, MD;  Location: [facility];  Service: General;  Laterality: Left;   excision of abcess     INTERCOSTAL NERVE BLOCK Left 12/16/2020   Procedure: INTERCOSTAL NERVE BLOCK;  Surgeon: Loreli Slot, MD;  Location: [facility] OR;  Service: Thoracic;  Laterality: Left;   KNEE ARTHROSCOPY W/ MENISCECTOMY Left 10-31-2004   LEFT SHOULDER ARTHROSCOPY/ ACROMIOPLASTY/ DECOMPRESSION/ SYNOVECTOMY  11-20-2007   LYMPH NODE DISSECTION Left 12/16/2020   Procedure: LYMPH NODE DISSECTION, left lung;  Surgeon: Loreli Slot, MD;  Location: [facility] OR;  Service: Thoracic;  Laterality: Left;   POLYPECTOMY     TRANSURETHRAL RESECTION OF PROSTATE N/A 11/03/2013   Procedure: TRANSURETHRAL RESECTION OF THE PROSTATE WITH GYRUS INSTRUMENTS;  Surgeon: Marcine Matar, MD;  Location: [facility];  Service: Urology;  Laterality: N/A;   TRIGGER FINGER RELEASE Right 2019   VIDEO ASSISTED THORACOSCOPY (VATS)/ LOBECTOMY Right 04-10-2009  DR Edwyna Shell   RIGHT UPPER LOBECTOMY AND NODE DISSECTION   VIDEO BRONCHOSCOPY WITH ENDOBRONCHIAL NAVIGATION N/A 12/02/2020   Procedure: VIDEO BRONCHOSCOPY WITH ENDOBRONCHIAL NAVIGATION;  Surgeon: Loreli Slot, MD;  Location: [facility] OR;  Service: Thoracic;  Laterality: N/A;   XI ROBOTIC ASSISTED THORASCOPY-LEFT LOWER LOBECTOMY  (Left  12/16/2020   Social History   Occupational History   Not on file  Tobacco Use   Smoking status: Former    Current packs/day: 0.00    Types: Cigarettes    Start date: 05/28/1980    Quit date: 05/28/2000    Years since quitting: 23.7   Smokeless tobacco: Never  Vaping Use   Vaping status: Never Used  Substance and Sexual Activity   Alcohol use: Not Currently   Drug use: No   Sexual activity: Not on file

## 2024-02-25 ENCOUNTER — Encounter: Payer: Self-pay | Admitting: Physical Therapy

## 2024-02-25 ENCOUNTER — Ambulatory Visit (INDEPENDENT_AMBULATORY_CARE_PROVIDER_SITE_OTHER): Admitting: Physical Therapy

## 2024-02-25 DIAGNOSIS — M5459 Other low back pain: Secondary | ICD-10-CM | POA: Diagnosis not present

## 2024-02-25 DIAGNOSIS — M25551 Pain in right hip: Secondary | ICD-10-CM

## 2024-02-25 DIAGNOSIS — M6281 Muscle weakness (generalized): Secondary | ICD-10-CM

## 2024-02-25 DIAGNOSIS — M25562 Pain in left knee: Secondary | ICD-10-CM | POA: Diagnosis not present

## 2024-02-25 DIAGNOSIS — R262 Difficulty in walking, not elsewhere classified: Secondary | ICD-10-CM

## 2024-02-25 NOTE — Therapy (Signed)
 OUTPATIENT PHYSICAL THERAPY LOWER EXTREMITY EVALUATION   Patient Name: Bobby [name] MRN: [id number] DOB:[birth year], 78 y.o., male Today's Date: 02/25/2024  END OF SESSION:  PT End of Session - 02/25/24 1433     Visit Number 1    Number of Visits 20    Date for PT Re-Evaluation 05/09/24    Progress Note Due on Visit 10    PT Start Time 1346    PT Stop Time 1425    PT Time Calculation (min) 39 min    Activity Tolerance Patient tolerated treatment well    Behavior During Therapy WFL for tasks assessed/performed             Past Medical History:  Diagnosis Date   Arthritis    Right wrist   BPH (benign prostatic hypertrophy)    COPD (chronic obstructive pulmonary disease) (HCC)    emphysema   Frequency of urination    History of lung cancer    STAGE 1A NON-SMALL CELL LUNG CARCINOMA ---  04-10-2009   S/P RIGHT UPPER LOBECTOMY , NO CHEMORADIATION--  NO RECURRENCE  (ONCOLOGIST-- DR Arbutus Ped)   Hyperlipidemia    Hypertension    nscl ca dx'd 04/2009   lung   Pulmonary nodule seen on imaging study    STABLE LEFT LOWER LOBE NODULE  PER CHEST CT--  MONITORED BY DR [facility]   Sleep apnea    OSA does not use CPAP   Type 2 diabetes mellitus (HCC)    Urgency of urination    Past Surgical History:  Procedure Laterality Date   CARDIOVASCULAR STRESS TEST  04-08-2009   NORMAL NUCLEAR STUDY/ NO ISCHEMIA/ EF 60%   CARPAL TUNNEL RELEASE Right    COLONOSCOPY  last 2019   CYST EXCISION Left 10/03/2018   Procedure: EXCISION OF LEFT AXILLARY SEBACEOUS CYST;  Surgeon: Abigail Miyamoto, MD;  Location: [facility];  Service: General;  Laterality: Left;   excision of abcess     INTERCOSTAL NERVE BLOCK Left 12/16/2020   Procedure: INTERCOSTAL NERVE BLOCK;  Surgeon: Loreli Slot, MD;  Location: [facility] OR;  Service: Thoracic;  Laterality: Left;   KNEE ARTHROSCOPY W/ MENISCECTOMY Left 10-31-2004   LEFT SHOULDER ARTHROSCOPY/ ACROMIOPLASTY/ DECOMPRESSION/ SYNOVECTOMY   11-20-2007   LYMPH NODE DISSECTION Left 12/16/2020   Procedure: LYMPH NODE DISSECTION, left lung;  Surgeon: Loreli Slot, MD;  Location: [facility] OR;  Service: Thoracic;  Laterality: Left;   POLYPECTOMY     TRANSURETHRAL RESECTION OF PROSTATE N/A 11/03/2013   Procedure: TRANSURETHRAL RESECTION OF THE PROSTATE WITH GYRUS INSTRUMENTS;  Surgeon: Marcine Matar, MD;  Location: [facility];  Service: Urology;  Laterality: N/A;   TRIGGER FINGER RELEASE Right 2019   VIDEO ASSISTED THORACOSCOPY (VATS)/ LOBECTOMY Right 04-10-2009  DR Edwyna Shell   RIGHT UPPER LOBECTOMY AND NODE DISSECTION   VIDEO BRONCHOSCOPY WITH ENDOBRONCHIAL NAVIGATION N/A 12/02/2020   Procedure: VIDEO BRONCHOSCOPY WITH ENDOBRONCHIAL NAVIGATION;  Surgeon: Loreli Slot, MD;  Location: [facility] OR;  Service: Thoracic;  Laterality: N/A;   XI ROBOTIC ASSISTED THORASCOPY-LEFT LOWER LOBECTOMY (Left  12/16/2020   Patient Active Problem List   Diagnosis Date Noted   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) 03/22/2021   S/P lobectomy of lung 12/16/2020   Scapholunate advanced collapse of right wrist 06/21/2017   Trigger finger, left middle finger 06/21/2017   Right hand pain 01/18/2017   Hypertrophy of prostate with urinary obstruction and other lower urinary tract symptoms (LUTS) 11/03/2013   Cancer  of upper lobe of right lung (HCC) 06/27/2012   OBESITY, UNSPECIFIED 04/08/2009   DIZZINESS 04/08/2009   DIABETES MELLITUS, TYPE II, CONTROLLED 04/07/2009   HYPERLIPIDEMIA 04/07/2009   Essential hypertension 04/07/2009    PCP: [name], MD   REFERRING PROVIDER: Cristie Hem, PA-C   REFERRING DIAG:   Diagnosis  M25.551 (ICD-10-CM) - Pain in right hip    THERAPY DIAG:  Right hip pain  Other low back pain  Acute pain of left knee  Muscle weakness (generalized)  Difficulty in walking, not elsewhere classified  Rationale for Evaluation and Treatment: Rehabilitation  ONSET DATE:  Hip and back over 1 month, left knee off and on for a few months  SUBJECTIVE:   SUBJECTIVE STATEMENT: Pt arriving reporting Rt hip and Rt side low back pain which has been on-going over the last month. Pt also reporting left knee pain which has been on-going for a couple of months. Pt stating he was having difficulty during due to now being able to lift his foot from gas pedal to break. Pt reporting difficulty having to go and up down the stairs.   PERTINENT HISTORY: COPD, h/o lung cancer, BPH, arthritis, HTN, DM2, Left knee arthroscopy c meniscectomy, Left shoulder arthroscopy 2008, left lower lobe lobectomy See PMH above   PAIN:  NPRS scale: 4-5/10 Pain location: rt side low back, Rt hip, left knee Pain description: achy, soreness Aggravating factors: walking, standing, bending, driving Relieving factors: changing positions, resting  PRECAUTIONS: Other: no E-stim over lung field due to h/o cancer  WEIGHT BEARING RESTRICTIONS: No  FALLS:  Has patient fallen in last 6 months? No  LIVING ENVIRONMENT: Lives with: lives with their family and lives with their spouse Lives in: House/apartment Stairs: Yes: Internal: 15 steps; on right going up Has following equipment at home:  raised toilet  OCCUPATION: retired  PLOF: Independent  PATIENT GOALS: be able to lift my leg to drive without pain, walk without pain  Next MD visit:   OBJECTIVE:   DIAGNOSTIC FINDINGS: 02/06/24 X-rays demonstrate advanced multilevel degenerative changes worse L5-S1    PATIENT SURVEYS:  Patient-Specific Activity Scoring Scheme  "0" represents "unable to perform." "10" represents "able to perform at prior level. 0 1 2 3 4 5 6 7 8 9  10 (Date and Score)   Activity Eval  02/25/24    1. Walking 5     2. driving 5     3. Climbing step 4   4.     5.    Score 14/3= 4.7    Total score = sum of the activity scores/number of activities Minimum detectable change (90%CI) for average score = 2  points Minimum detectable change (90%CI) for single activity score = 3 points  COGNITION: Overall cognitive status: WFL    SENSATION: WFL   POSTURE:  rounded shoulders, forward head, and decreased lumbar lordosis  PALPATION: TTP: lateral Rt hip and Rt QL  LOWER EXTREMITY ROM:   ROM Right 02/25/24 Supine  active Left 02/25/24 Supine active  Hip flexion 85 82  Hip extension    Hip abduction    Hip adduction    Hip internal rotation    Hip external rotation    Knee flexion 118 114  Knee extension 0 -6  Ankle dorsiflexion    Ankle plantarflexion    Ankle inversion    Ankle eversion     (Blank rows = not tested)  Lumbar ROM:  Lumbar flexion: 60 degrees Lumbar extension: 20 degrees  Side Bending to Rt: 28 degrees Side bending left: 22 degrees  LOWER EXTREMITY MMT:  MMT Right eval Left eval  Hip flexion 4 4  Hip extension    Hip abduction 4 4  Hip adduction 4 4  Hip internal rotation    Hip external rotation    Knee flexion 5 4  Knee extension 5 4-  Ankle dorsiflexion    Ankle plantarflexion    Ankle inversion    Ankle eversion     (Blank rows = not tested)  LOWER EXTREMITY SPECIAL TESTS:  Slump test: Rt: negative, Left negative  FUNCTIONAL TESTS:  02/25/24: 5 times sit to stand: 16.11 seconds UE support  GAIT: 02/25/24 Distance walked: clinic distances Assistive device utilized: None Level of assistance: Complete Independence Comments: decreased heel to toe gait pattern, mild forward flexion                                                                                                                                                                        TODAY'S TREATMENT                                                                          DATE: 02/25/24:  Therex: HEP instruction/performance c cues for techniques, handout provided.  Trial set performed of each for comprehension and symptom assessment.  See below for exercise list  PATIENT  EDUCATION:  Education details: HEP, POC Person educated: Patient Education method: Explanation, Demonstration, Verbal cues, and Handouts Education comprehension: verbalized understanding, returned demonstration, and verbal cues required  HOME EXERCISE PROGRAM: Access Code: [email or web address] URL: [email or web address] Date: 02/25/2024 Prepared by: Narda Amber  Exercises - Supine Bridge  - 2 x daily - 7 x weekly - 3 sets - 10 reps - 5 seconds hold - Supine Lower Trunk Rotation  - 2 x daily - 7 x weekly - 3 sets - 10 reps - Long Sitting Quad Set with Towel Roll Under Heel  - 2 x daily - 7 x weekly - 10 reps - 5 seconds hold - Supine Active Straight Leg Raise  - 2 x daily - 7 x weekly - 10 reps  ASSESSMENT:  CLINICAL IMPRESSION: Patient is a 78 y.o. who comes to clinic with complaints of Rt hip, Rt low back, left knee pain. Pt presents with mobility, strength and movement coordination deficits that impair their ability to perform usual daily and recreational functional activities without increase difficulty/symptoms at this time.  Patient  to benefit from skilled PT services to address impairments and limitations to improve to previous level of function without restriction secondary to condition.   OBJECTIVE IMPAIRMENTS: decreased balance, difficulty walking, decreased ROM, decreased strength, and pain.   ACTIVITY LIMITATIONS: lifting, bending, sitting, standing, squatting, stairs, and transfers  PARTICIPATION LIMITATIONS: cleaning, laundry, driving, and community activity  PERSONAL FACTORS: 3+ comorbidities: see PMH above  are also affecting patient's functional outcome.   REHAB POTENTIAL: Good  CLINICAL DECISION MAKING: Stable/uncomplicated  EVALUATION COMPLEXITY: Moderate   GOALS: Goals reviewed with patient? Yes  SHORT TERM GOALS: (target date for Short term goals are 3 weeks 03/17/2024)   1.  Patient will demonstrate independent use of home exercise program to  maintain progress from in clinic treatments.  Goal status: New  LONG TERM GOALS: (target dates for all long term goals are 10 weeks  05/09/2024)   1. Patient will demonstrate/report pain at worst less than or equal to 2/10 to facilitate minimal limitation in daily activity secondary to pain symptoms.  Goal status: New   2. Patient will demonstrate independent use of home exercise program to facilitate ability to maintain/progress functional gains from skilled physical therapy services.  Goal status: New   3. Patient will demonstrate Patient specific functional scale avg > or = 6.7 to indicate reduced disability due to condition.   Goal status: New   4.  Patient will demonstrate bil  LE MMT >/= 4+/5 throughout to faciltiate usual transfers, stairs, squatting at [facility] for daily life.   Goal status: New   5.  Patient will demonstrate reciprocal gait pattern up and down 1 flight stairs with single hand rail  Goal status: New   6.  Pt will report being able to walk 1/4 a mile in </= 20 minutes with pain </= 3/10.  Goal status: New   7.  Pt will perform 5 time sit to stand in </= 13 seconds with no UE support.  Goal Status: New   PLAN:  PT FREQUENCY: 1-2x/week  PT DURATION: 10 weeks  PLANNED INTERVENTIONS: Can include 16109- PT Re-evaluation, 97110-Therapeutic exercises, 97530- Therapeutic activity, 97112- Neuromuscular re-education, 97535- Self Care, 97140- Manual therapy, [phone]- Gait training, [phone]- Orthotic Fit/training, [phone]- Canalith repositioning, [id number]- Aquatic Therapy, [phone]- Electrical stimulation (unattended), [phone]- Electrical stimulation (manual), [id number] Physical performance testing, 97016- Vasopneumatic device, [id number]- Ultrasound, [id number]- Traction (mechanical), [id number]- Ionotophoresis 4mg /ml Dexamethasone, Patient/Family education, Balance training, Stair training, Taping, Dry Needling, Joint mobilization, Joint manipulation, Spinal manipulation, Spinal mobilization, Scar  mobilization, Vestibular training, Visual/preceptual remediation/compensation, DME instructions, Cryotherapy, and Moist heat.  All performed as medically necessary.  All included unless contraindicated  PLAN FOR NEXT SESSION: Review HEP knowledge/results, LE strengthening, functional mobility.       Sharmon Leyden, PT, MPT 02/25/2024, 2:34 PM

## 2024-02-28 ENCOUNTER — Ambulatory Visit (INDEPENDENT_AMBULATORY_CARE_PROVIDER_SITE_OTHER): Admitting: Rehabilitative and Restorative Service Providers"

## 2024-02-28 ENCOUNTER — Encounter: Payer: Self-pay | Admitting: Rehabilitative and Restorative Service Providers"

## 2024-02-28 DIAGNOSIS — M25551 Pain in right hip: Secondary | ICD-10-CM | POA: Diagnosis not present

## 2024-02-28 DIAGNOSIS — M6281 Muscle weakness (generalized): Secondary | ICD-10-CM

## 2024-02-28 DIAGNOSIS — M25562 Pain in left knee: Secondary | ICD-10-CM | POA: Diagnosis not present

## 2024-02-28 DIAGNOSIS — R262 Difficulty in walking, not elsewhere classified: Secondary | ICD-10-CM

## 2024-02-28 DIAGNOSIS — M5459 Other low back pain: Secondary | ICD-10-CM

## 2024-02-28 NOTE — Therapy (Signed)
 OUTPATIENT PHYSICAL THERAPY TREATMENT   Patient Name: Bobby [name] MRN: [id number] DOB:1946-02-12, 78 y.o., male Today's Date: 02/28/2024  END OF SESSION:  PT End of Session - 02/28/24 1000     Visit Number 2    Number of Visits 20    Date for PT Re-Evaluation 05/09/24    Authorization Type [employer]    Progress Note Due on Visit 10    PT Start Time 1019    PT Stop Time 1058    PT Time Calculation (min) 39 min    Activity Tolerance Patient tolerated treatment well    Behavior During Therapy WFL for tasks assessed/performed              Past Medical History:  Diagnosis Date   Arthritis    Right wrist   BPH (benign prostatic hypertrophy)    COPD (chronic obstructive pulmonary disease) (HCC)    emphysema   Frequency of urination    History of lung cancer    STAGE 1A NON-SMALL CELL LUNG CARCINOMA ---  04-10-2009   S/P RIGHT UPPER LOBECTOMY , NO CHEMORADIATION--  NO RECURRENCE  (ONCOLOGIST-- DR Arbutus Ped)   Hyperlipidemia    Hypertension    nscl ca dx'd 04/2009   lung   Pulmonary nodule seen on imaging study    STABLE LEFT LOWER LOBE NODULE  PER CHEST CT--  MONITORED BY DR [facility]   Sleep apnea    OSA does not use CPAP   Type 2 diabetes mellitus (HCC)    Urgency of urination    Past Surgical History:  Procedure Laterality Date   CARDIOVASCULAR STRESS TEST  04-08-2009   NORMAL NUCLEAR STUDY/ NO ISCHEMIA/ EF 60%   CARPAL TUNNEL RELEASE Right    COLONOSCOPY  last 2019   CYST EXCISION Left 10/03/2018   Procedure: EXCISION OF LEFT AXILLARY SEBACEOUS CYST;  Surgeon: Abigail Miyamoto, MD;  Location: [facility];  Service: General;  Laterality: Left;   excision of abcess     INTERCOSTAL NERVE BLOCK Left 12/16/2020   Procedure: INTERCOSTAL NERVE BLOCK;  Surgeon: Loreli Slot, MD;  Location: [facility] OR;  Service: Thoracic;  Laterality: Left;   KNEE ARTHROSCOPY W/ MENISCECTOMY Left 10-31-2004   LEFT SHOULDER ARTHROSCOPY/ ACROMIOPLASTY/  DECOMPRESSION/ SYNOVECTOMY  11-20-2007   LYMPH NODE DISSECTION Left 12/16/2020   Procedure: LYMPH NODE DISSECTION, left lung;  Surgeon: Loreli Slot, MD;  Location: [facility] OR;  Service: Thoracic;  Laterality: Left;   POLYPECTOMY     TRANSURETHRAL RESECTION OF PROSTATE N/A 11/03/2013   Procedure: TRANSURETHRAL RESECTION OF THE PROSTATE WITH GYRUS INSTRUMENTS;  Surgeon: Marcine Matar, MD;  Location: [facility];  Service: Urology;  Laterality: N/A;   TRIGGER FINGER RELEASE Right 2019   VIDEO ASSISTED THORACOSCOPY (VATS)/ LOBECTOMY Right 04-10-2009  DR Edwyna Shell   RIGHT UPPER LOBECTOMY AND NODE DISSECTION   VIDEO BRONCHOSCOPY WITH ENDOBRONCHIAL NAVIGATION N/A 12/02/2020   Procedure: VIDEO BRONCHOSCOPY WITH ENDOBRONCHIAL NAVIGATION;  Surgeon: Loreli Slot, MD;  Location: [facility] OR;  Service: Thoracic;  Laterality: N/A;   XI ROBOTIC ASSISTED THORASCOPY-LEFT LOWER LOBECTOMY (Left  12/16/2020   Patient Active Problem List   Diagnosis Date Noted   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) 03/22/2021   S/P lobectomy of lung 12/16/2020   Scapholunate advanced collapse of right wrist 06/21/2017   Trigger finger, left middle finger 06/21/2017   Right hand pain 01/18/2017   Hypertrophy of prostate with urinary obstruction and other lower urinary tract  symptoms (LUTS) 11/03/2013   Cancer of upper lobe of right lung (HCC) 06/27/2012   OBESITY, UNSPECIFIED 04/08/2009   DIZZINESS 04/08/2009   DIABETES MELLITUS, TYPE II, CONTROLLED 04/07/2009   HYPERLIPIDEMIA 04/07/2009   Essential hypertension 04/07/2009    PCP: [name], MD   REFERRING PROVIDER: Cristie Hem, PA-C   REFERRING DIAG:   Diagnosis  M25.551 (ICD-10-CM) - Pain in right hip    THERAPY DIAG:  Right hip pain  Other low back pain  Acute pain of left knee  Muscle weakness (generalized)  Difficulty in walking, not elsewhere classified  Rationale for Evaluation and Treatment:  Rehabilitation  ONSET DATE: Hip and back over 1 month, left knee off and on for a few months  SUBJECTIVE:   SUBJECTIVE STATEMENT: Pt indicated mainly Lt knee and some hip pain today.  Pt indicated overall about the same since last visit.   PERTINENT HISTORY: COPD, h/o lung cancer, BPH, arthritis, HTN, DM2, Left knee arthroscopy c meniscectomy, Left shoulder arthroscopy 2008, left lower lobe lobectomy See PMH above   PAIN:  NPRS scale: Lt knee up to 7/10, back/ hip 6/10 Pain location: rt side low back, Rt hip, left knee Pain description: achy, soreness Aggravating factors: walking, standing, bending, driving Relieving factors: changing positions, resting  PRECAUTIONS: Other: no E-stim over lung field due to h/o cancer  WEIGHT BEARING RESTRICTIONS: No  FALLS:  Has patient fallen in last 6 months? No  LIVING ENVIRONMENT: Lives with: lives with their family and lives with their spouse Lives in: House/apartment Stairs: Yes: Internal: 15 steps; on right going up Has following equipment at home:  raised toilet  OCCUPATION: retired  PLOF: Independent  PATIENT GOALS: be able to lift my leg to drive without pain, walk without pain  Next MD visit:   OBJECTIVE:   DIAGNOSTIC FINDINGS: 02/06/24 X-rays demonstrate advanced multilevel degenerative changes worse L5-S1    PATIENT SURVEYS:  Patient-Specific Activity Scoring Scheme  "0" represents "unable to perform." "10" represents "able to perform at prior level. 0 1 2 3 4 5 6 7 8 9  10 (Date and Score)   Activity Eval  02/25/24    1. Walking 5     2. driving 5     3. Climbing step 4   4.     5.    Score 14/3= 4.7    Total score = sum of the activity scores/number of activities Minimum detectable change (90%CI) for average score = 2 points Minimum detectable change (90%CI) for single activity score = 3 points  COGNITION: Overall cognitive status: WFL    SENSATION: WFL   POSTURE:  rounded shoulders, forward  head, and decreased lumbar lordosis  PALPATION: TTP: lateral Rt hip and Rt QL  LOWER EXTREMITY ROM:   ROM Right 02/25/24 Supine  active Left 02/25/24 Supine active  Hip flexion 85 82  Hip extension    Hip abduction    Hip adduction    Hip internal rotation    Hip external rotation    Knee flexion 118 114  Knee extension 0 -6  Ankle dorsiflexion    Ankle plantarflexion    Ankle inversion    Ankle eversion     (Blank rows = not tested)  Lumbar ROM:  02/28/2024: Lumbar extension 25 % WFL with pain noted in Rt lumbar/posterior hip.  X 5 in standing produced similar pain each time.    02/25/2024 eval: Lumbar flexion: 60 degrees Lumbar extension: 20 degrees Side Bending to Rt: 28  degrees Side bending left: 22 degrees  LOWER EXTREMITY MMT:  MMT Right eval Left eval  Hip flexion 4 4  Hip extension    Hip abduction 4 4  Hip adduction 4 4  Hip internal rotation    Hip external rotation    Knee flexion 5 4  Knee extension 5 4-  Ankle dorsiflexion    Ankle plantarflexion    Ankle inversion    Ankle eversion     (Blank rows = not tested)  LOWER EXTREMITY SPECIAL TESTS:  02/26/2024 Slump test: Rt: negative, Left negative  FUNCTIONAL TESTS:  02/28/2024:  18 inch chair transfer s UE assist with fair control in lowering.   02/25/24: 5 times sit to stand: 16.11 seconds UE support  GAIT:  02/25/24 Distance walked: clinic distances Assistive device utilized: None Level of assistance: Complete Independence Comments: decreased heel to toe gait pattern, mild forward flexion                                                                                                                                                                        TODAY'S TREATMENT                                                                          DATE: 02/28/2024 Therex: Supine lumbar trunk rotation stretch 15 sec x 3 bilateral Supine bridge with green band hip abduction hold in movement  2 x 10 2-3 sec hold  Supine hooklying hip abduction clam shell green band with contralateral leg isometric hold x 20 bilateral  Supine SLR x 10 bilateral   Nustep Lvl 5 UE/LE 8 mins  Sit to stand to sit no UE assist x 10 from 22 inch table height  Review of existing HEP c verbal cues for techniques.   Neuro Re-ed: (to improve neuro muscular recruitment) Supine quad set 5 sec hold x 10 bilateral  Seated quad set instruction for daily use in chair if desired.  Performed 5 sec hold 2-3 as demo in clinic.    TODAY'S TREATMENT  DATE: 02/25/24:  Therex: HEP instruction/performance c cues for techniques, handout provided.  Trial set performed of each for comprehension and symptom assessment.  See below for exercise list  PATIENT EDUCATION:  Education details: HEP, POC Person educated: Patient Education method: Explanation, Demonstration, Verbal cues, and Handouts Education comprehension: verbalized understanding, returned demonstration, and verbal cues required  HOME EXERCISE PROGRAM: Access Code: [email or web address] URL: [email or web address] Date: 02/25/2024 Prepared by: Narda Amber  Exercises - Supine Bridge  - 2 x daily - 7 x weekly - 3 sets - 10 reps - 5 seconds hold - Supine Lower Trunk Rotation  - 2 x daily - 7 x weekly - 3 sets - 10 reps - Long Sitting Quad Set with Towel Roll Under Heel  - 2 x daily - 7 x weekly - 10 reps - 5 seconds hold - Supine Active Straight Leg Raise  - 2 x daily - 7 x weekly - 10 reps  ASSESSMENT:  CLINICAL IMPRESSION: Continued emphasis on progressive muscle activation/strengthening throughout core/hip and LE to improve control and strength.  Good overall recall of existing HEP without pain complaints during performance.  Continued skilled PT services indicated to address impairments and symptoms related to complaints.   OBJECTIVE IMPAIRMENTS: decreased balance, difficulty  walking, decreased ROM, decreased strength, and pain.   ACTIVITY LIMITATIONS: lifting, bending, sitting, standing, squatting, stairs, and transfers  PARTICIPATION LIMITATIONS: cleaning, laundry, driving, and community activity  PERSONAL FACTORS: 3+ comorbidities: see PMH above  are also affecting patient's functional outcome.   REHAB POTENTIAL: Good  CLINICAL DECISION MAKING: Stable/uncomplicated  EVALUATION COMPLEXITY: Moderate   GOALS: Goals reviewed with patient? Yes  SHORT TERM GOALS: (target date for Short term goals are 3 weeks 03/17/2024)   1.  Patient will demonstrate independent use of home exercise program to maintain progress from in clinic treatments.  Goal status: on going 02/28/2024  LONG TERM GOALS: (target dates for all long term goals are 10 weeks  05/09/2024)   1. Patient will demonstrate/report pain at worst less than or equal to 2/10 to facilitate minimal limitation in daily activity secondary to pain symptoms.  Goal status: New   2. Patient will demonstrate independent use of home exercise program to facilitate ability to maintain/progress functional gains from skilled physical therapy services.  Goal status: New   3. Patient will demonstrate Patient specific functional scale avg > or = 6.7 to indicate reduced disability due to condition.   Goal status: New   4.  Patient will demonstrate bil  LE MMT >/= 4+/5 throughout to faciltiate usual transfers, stairs, squatting at [facility] for daily life.   Goal status: New   5.  Patient will demonstrate reciprocal gait pattern up and down 1 flight stairs with single hand rail  Goal status: New   6.  Pt will report being able to walk 1/4 a mile in </= 20 minutes with pain </= 3/10.  Goal status: New   7.  Pt will perform 5 time sit to stand in </= 13 seconds with no UE support.  Goal Status: New   PLAN:  PT FREQUENCY: 1-2x/week  PT DURATION: 10 weeks  PLANNED INTERVENTIONS: Can include 54098- PT  Re-evaluation, 97110-Therapeutic exercises, 97530- Therapeutic activity, [id number]- Neuromuscular re-education, 97535- Self Care, 97140- Manual therapy, [phone]- Gait training, [phone]- Orthotic Fit/training, [phone]- Canalith repositioning, [id number]- Aquatic Therapy, [id number]- Electrical stimulation (unattended), [id number]- Electrical stimulation (manual), [id number] Physical performance testing, 97016- Vasopneumatic device, [id number]- Ultrasound, [id number]- Traction (mechanical), [id number]- Ionotophoresis 4mg /ml Dexamethasone, Patient/Family education, Balance training,  Stair training, Taping, Dry Needling, Joint mobilization, Joint manipulation, Spinal manipulation, Spinal mobilization, Scar mobilization, Vestibular training, Visual/preceptual remediation/compensation, DME instructions, Cryotherapy, and Moist heat.  All performed as medically necessary.  All included unless contraindicated  PLAN FOR NEXT SESSION: Possible add of newer exercises to HEP for hip/LE strengthening pending response.    Chyrel Masson, PT, DPT, OCS, ATC 02/28/24  10:57 AM

## 2024-03-11 ENCOUNTER — Encounter: Payer: Self-pay | Admitting: Podiatry

## 2024-03-11 ENCOUNTER — Ambulatory Visit (INDEPENDENT_AMBULATORY_CARE_PROVIDER_SITE_OTHER): Payer: 59 | Admitting: Podiatry

## 2024-03-11 ENCOUNTER — Encounter: Payer: Self-pay | Admitting: Rehabilitative and Restorative Service Providers"

## 2024-03-11 ENCOUNTER — Ambulatory Visit (INDEPENDENT_AMBULATORY_CARE_PROVIDER_SITE_OTHER): Admitting: Rehabilitative and Restorative Service Providers"

## 2024-03-11 VITALS — Ht 70.0 in | Wt 227.0 lb

## 2024-03-11 DIAGNOSIS — B351 Tinea unguium: Secondary | ICD-10-CM

## 2024-03-11 DIAGNOSIS — R262 Difficulty in walking, not elsewhere classified: Secondary | ICD-10-CM

## 2024-03-11 DIAGNOSIS — M5459 Other low back pain: Secondary | ICD-10-CM | POA: Diagnosis not present

## 2024-03-11 DIAGNOSIS — E119 Type 2 diabetes mellitus without complications: Secondary | ICD-10-CM | POA: Diagnosis not present

## 2024-03-11 DIAGNOSIS — M2142 Flat foot [pes planus] (acquired), left foot: Secondary | ICD-10-CM | POA: Diagnosis not present

## 2024-03-11 DIAGNOSIS — M79675 Pain in left toe(s): Secondary | ICD-10-CM | POA: Diagnosis not present

## 2024-03-11 DIAGNOSIS — M6281 Muscle weakness (generalized): Secondary | ICD-10-CM

## 2024-03-11 DIAGNOSIS — M79674 Pain in right toe(s): Secondary | ICD-10-CM | POA: Diagnosis not present

## 2024-03-11 DIAGNOSIS — M2141 Flat foot [pes planus] (acquired), right foot: Secondary | ICD-10-CM

## 2024-03-11 DIAGNOSIS — M25551 Pain in right hip: Secondary | ICD-10-CM | POA: Diagnosis not present

## 2024-03-11 DIAGNOSIS — M25562 Pain in left knee: Secondary | ICD-10-CM

## 2024-03-11 NOTE — Therapy (Signed)
 OUTPATIENT PHYSICAL THERAPY TREATMENT   Patient Name: Bobby [name] MRN: [id number] DOB:Nov 30, 1946, 78 y.o., male Today's Date: 03/11/2024  END OF SESSION:  PT End of Session - 03/11/24 1022     Visit Number 3    Number of Visits 20    Date for PT Re-Evaluation 05/09/24    Authorization Type [employer]    Progress Note Due on Visit 10    PT Start Time 1018    PT Stop Time 1057    PT Time Calculation (min) 39 min    Activity Tolerance Patient tolerated treatment well    Behavior During Therapy WFL for tasks assessed/performed               Past Medical History:  Diagnosis Date   Arthritis    Right wrist   BPH (benign prostatic hypertrophy)    COPD (chronic obstructive pulmonary disease) (HCC)    emphysema   Frequency of urination    History of lung cancer    STAGE 1A NON-SMALL CELL LUNG CARCINOMA ---  04-10-2009   S/P RIGHT UPPER LOBECTOMY , NO CHEMORADIATION--  NO RECURRENCE  (ONCOLOGIST-- DR Arbutus Ped)   Hyperlipidemia    Hypertension    nscl ca dx'd 04/2009   lung   Pulmonary nodule seen on imaging study    STABLE LEFT LOWER LOBE NODULE  PER CHEST CT--  MONITORED BY DR [facility]   Sleep apnea    OSA does not use CPAP   Type 2 diabetes mellitus (HCC)    Urgency of urination    Past Surgical History:  Procedure Laterality Date   CARDIOVASCULAR STRESS TEST  04-08-2009   NORMAL NUCLEAR STUDY/ NO ISCHEMIA/ EF 60%   CARPAL TUNNEL RELEASE Right    COLONOSCOPY  last 2019   CYST EXCISION Left 10/03/2018   Procedure: EXCISION OF LEFT AXILLARY SEBACEOUS CYST;  Surgeon: Abigail Miyamoto, MD;  Location: [facility];  Service: General;  Laterality: Left;   excision of abcess     INTERCOSTAL NERVE BLOCK Left 12/16/2020   Procedure: INTERCOSTAL NERVE BLOCK;  Surgeon: Loreli Slot, MD;  Location: [facility] OR;  Service: Thoracic;  Laterality: Left;   KNEE ARTHROSCOPY W/ MENISCECTOMY Left 10-31-2004   LEFT SHOULDER ARTHROSCOPY/ ACROMIOPLASTY/  DECOMPRESSION/ SYNOVECTOMY  11-20-2007   LYMPH NODE DISSECTION Left 12/16/2020   Procedure: LYMPH NODE DISSECTION, left lung;  Surgeon: Loreli Slot, MD;  Location: [facility] OR;  Service: Thoracic;  Laterality: Left;   POLYPECTOMY     TRANSURETHRAL RESECTION OF PROSTATE N/A 11/03/2013   Procedure: TRANSURETHRAL RESECTION OF THE PROSTATE WITH GYRUS INSTRUMENTS;  Surgeon: Marcine Matar, MD;  Location[facility];  Service: Urology;  Laterality: N/A;   TRIGGER FINGER RELEASE Right 2019   VIDEO ASSISTED THORACOSCOPY (VATS)/ LOBECTOMY Right 04-10-2009  DR Edwyna Shell   RIGHT UPPER LOBECTOMY AND NODE DISSECTION   VIDEO BRONCHOSCOPY WITH ENDOBRONCHIAL NAVIGATION N/A 12/02/2020   Procedure: VIDEO BRONCHOSCOPY WITH ENDOBRONCHIAL NAVIGATION;  Surgeon: Loreli Slot, MD;  Location: [facility] OR;  Service: Thoracic;  Laterality: N/A;   XI ROBOTIC ASSISTED THORASCOPY-LEFT LOWER LOBECTOMY (Left  12/16/2020   Patient Active Problem List   Diagnosis Date Noted   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) 03/22/2021   S/P lobectomy of lung 12/16/2020   Scapholunate advanced collapse of right wrist 06/21/2017   Trigger finger, left middle finger 06/21/2017   Right hand pain 01/18/2017   Hypertrophy of prostate with urinary obstruction and other lower urinary  tract symptoms (LUTS) 11/03/2013   Cancer of upper lobe of right lung (HCC) 06/27/2012   OBESITY, UNSPECIFIED 04/08/2009   DIZZINESS 04/08/2009   DIABETES MELLITUS, TYPE II, CONTROLLED 04/07/2009   HYPERLIPIDEMIA 04/07/2009   Essential hypertension 04/07/2009    PCP: [name], MD   REFERRING PROVIDER: Cristie Hem, PA-C   REFERRING DIAG:   Diagnosis  M25.551 (ICD-10-CM) - Pain in right hip    THERAPY DIAG:  Right hip pain  Other low back pain  Acute pain of left knee  Muscle weakness (generalized)  Difficulty in walking, not elsewhere classified  Rationale for Evaluation and Treatment:  Rehabilitation  ONSET DATE: Hip and back over 1 month, left knee off and on for a few months  SUBJECTIVE:   SUBJECTIVE STATEMENT: Pt indicated continued complaints about the same in knee.  Reported some reduction in complaints in back/hip.   PERTINENT HISTORY: COPD, h/o lung cancer, BPH, arthritis, HTN, DM2, Left knee arthroscopy c meniscectomy, Left shoulder arthroscopy 2008, left lower lobe lobectomy See PMH above   PAIN:  NPRS scale: Lt knee up to 7/10, back/ hip 4/10 Pain location: rt side low back, Rt hip, left knee Pain description: achy, soreness Aggravating factors: walking, standing, bending, driving Relieving factors: changing positions, resting  PRECAUTIONS: Other: no E-stim over lung field due to h/o cancer  WEIGHT BEARING RESTRICTIONS: No  FALLS:  Has patient fallen in last 6 months? No  LIVING ENVIRONMENT: Lives with: lives with their family and lives with their spouse Lives in: House/apartment Stairs: Yes: Internal: 15 steps; on right going up Has following equipment at home:  raised toilet  OCCUPATION: retired  PLOF: Independent  PATIENT GOALS: be able to lift my leg to drive without pain, walk without pain  Next MD visit:   OBJECTIVE:   DIAGNOSTIC FINDINGS: 02/06/24 X-rays demonstrate advanced multilevel degenerative changes worse L5-S1    PATIENT SURVEYS:  Patient-Specific Activity Scoring Scheme  "0" represents "unable to perform." "10" represents "able to perform at prior level. 0 1 2 3 4 5 6 7 8 9  10 (Date and Score)   Activity Eval  02/25/24    1. Walking 5     2. driving 5     3. Climbing step 4   4.     5.    Score 14/3= 4.7    Total score = sum of the activity scores/number of activities Minimum detectable change (90%CI) for average score = 2 points Minimum detectable change (90%CI) for single activity score = 3 points  COGNITION: Overall cognitive status: WFL    SENSATION: WFL   POSTURE:  rounded shoulders, forward  head, and decreased lumbar lordosis  PALPATION: TTP: lateral Rt hip and Rt QL  LOWER EXTREMITY ROM:   ROM Right 02/25/24 Supine  active Left 02/25/24 Supine active  Hip flexion 85 82  Hip extension    Hip abduction    Hip adduction    Hip internal rotation    Hip external rotation    Knee flexion 118 114  Knee extension 0 -6  Ankle dorsiflexion    Ankle plantarflexion    Ankle inversion    Ankle eversion     (Blank rows = not tested)  Lumbar ROM:  02/28/2024: Lumbar extension 25 % WFL with pain noted in Rt lumbar/posterior hip.  X 5 in standing produced similar pain each time.    02/25/2024 eval: Lumbar flexion: 60 degrees Lumbar extension: 20 degrees Side Bending to Rt: 28 degrees Side  bending left: 22 degrees  LOWER EXTREMITY MMT:  MMT Right eval Left eval  Hip flexion 4 4  Hip extension    Hip abduction 4 4  Hip adduction 4 4  Hip internal rotation    Hip external rotation    Knee flexion 5 4  Knee extension 5 4-  Ankle dorsiflexion    Ankle plantarflexion    Ankle inversion    Ankle eversion     (Blank rows = not tested)  LOWER EXTREMITY SPECIAL TESTS:  02/26/2024 Slump test: Rt: negative, Left negative  FUNCTIONAL TESTS:  02/28/2024:  18 inch chair transfer s UE assist with fair control in lowering.   02/25/24: 5 times sit to stand: 16.11 seconds UE support  GAIT:  03/11/2024:  Deviation in stance on Lt leg c maintained knee flexion noted.   02/25/24 Distance walked: clinic distances Assistive device utilized: None Level of assistance: Complete Independence Comments: decreased heel to toe gait pattern, mild forward flexion                                                                                                                                                                        TODAY'S TREATMENT                                                                          DATE: 03/11/2024 Therex: Supine lumbar trunk rotation stretch 15 sec  x 3 bilateral Supine bridge with green band hip abduction hold in movement 2 x 10 2-3 sec hold  Supine hooklying hip abduction clam shell green band with contralateral leg isometric hold 2 x 20 bilateral  Nustep Lvl 6 UE/LE 8 mins  Incline gastroc stretch 30 sec x 3 bilateral  Verbal review of at home leg strengthening activity  TherActivity(to improve squatting, transfers, stair navigation) Leg press double leg  82 lbs x 15  , single leg  2 x 15 37 lbs,  performed bilaterally - performed without knee pain and in range available without pain in flexion.   Neuro Re-ed: (to improve neuro muscular recruitment, balance improvements for WB control) Tandem stance 1 min x 1 bilateral with occasional HHA on bar - time spent in instruction  TODAY'S TREATMENT  DATE: 02/28/2024 Therex: Supine lumbar trunk rotation stretch 15 sec x 3 bilateral Supine bridge with green band hip abduction hold in movement 2 x 10 2-3 sec hold  Supine hooklying hip abduction clam shell green band with contralateral leg isometric hold x 20 bilateral  Supine SLR x 10 bilateral   Nustep Lvl 5 UE/LE 8 mins  Sit to stand to sit no UE assist x 10 from 22 inch table height  Review of existing HEP c verbal cues for techniques.   Neuro Re-ed: (to improve neuro muscular recruitment) Supine quad set 5 sec hold x 10 bilateral  Seated quad set instruction for daily use in chair if desired.  Performed 5 sec hold 2-3 as demo in clinic.    TODAY'S TREATMENT                                                                          DATE: 02/25/24:  Therex: HEP instruction/performance c cues for techniques, handout provided.  Trial set performed of each for comprehension and symptom assessment.  See below for exercise list  PATIENT EDUCATION:  Education details: HEP, POC Person educated: Patient Education method: Explanation, Demonstration, Verbal cues, and  Handouts Education comprehension: verbalized understanding, returned demonstration, and verbal cues required  HOME EXERCISE PROGRAM: Access Code: [email or web address] URL: [email or web address] Date: 02/25/2024 Prepared by: Narda Amber  Exercises - Supine Bridge  - 2 x daily - 7 x weekly - 3 sets - 10 reps - 5 seconds hold - Supine Lower Trunk Rotation  - 2 x daily - 7 x weekly - 3 sets - 10 reps - Long Sitting Quad Set with Towel Roll Under Heel  - 2 x daily - 7 x weekly - 10 reps - 5 seconds hold - Supine Active Straight Leg Raise  - 2 x daily - 7 x weekly - 10 reps  ASSESSMENT:  CLINICAL IMPRESSION: Lt knee most impactful pain area in ambulation and mobility.  Continued introduction of improved strength and control to improve WB tolerance. New exercise performed without pain report.  Continued skilled PT services indicated at this time.   OBJECTIVE IMPAIRMENTS: decreased balance, difficulty walking, decreased ROM, decreased strength, and pain.   ACTIVITY LIMITATIONS: lifting, bending, sitting, standing, squatting, stairs, and transfers  PARTICIPATION LIMITATIONS: cleaning, laundry, driving, and community activity  PERSONAL FACTORS: 3+ comorbidities: see PMH above  are also affecting patient's functional outcome.   REHAB POTENTIAL: Good  CLINICAL DECISION MAKING: Stable/uncomplicated  EVALUATION COMPLEXITY: Moderate   GOALS: Goals reviewed with patient? Yes  SHORT TERM GOALS: (target date for Short term goals are 3 weeks 03/17/2024)   1.  Patient will demonstrate independent use of home exercise program to maintain progress from in clinic treatments.  Goal status: on going 02/28/2024  LONG TERM GOALS: (target dates for all long term goals are 10 weeks  05/09/2024)   1. Patient will demonstrate/report pain at worst less than or equal to 2/10 to facilitate minimal limitation in daily activity secondary to pain symptoms.  Goal status: New   2. Patient will  demonstrate independent use of home exercise program to facilitate ability to maintain/progress functional gains from skilled physical therapy services.  Goal status: New   3. Patient will  demonstrate Patient specific functional scale avg > or = 6.7 to indicate reduced disability due to condition.   Goal status: New   4.  Patient will demonstrate bil  LE MMT >/= 4+/5 throughout to faciltiate usual transfers, stairs, squatting at [facility] for daily life.   Goal status: New   5.  Patient will demonstrate reciprocal gait pattern up and down 1 flight stairs with single hand rail  Goal status: New   6.  Pt will report being able to walk 1/4 a mile in </= 20 minutes with pain </= 3/10.  Goal status: New   7.  Pt will perform 5 time sit to stand in </= 13 seconds with no UE support.  Goal Status: New   PLAN:  PT FREQUENCY: 1-2x/week  PT DURATION: 10 weeks  PLANNED INTERVENTIONS: Can include [id number]- PT Re-evaluation, 97110-Therapeutic exercises, 97530- Therapeutic activity, 97112- Neuromuscular re-education, 97535- Self Care, 97140- Manual therapy, [phone]- Gait training, [phone]- Orthotic Fit/training, [phone]- Canalith repositioning, [id number]- Aquatic Therapy, [phone]- Electrical stimulation (unattended), [phone]- Electrical stimulation (manual), [id number] Physical performance testing, 97016- Vasopneumatic device, [id number]- Ultrasound, [id number]- Traction (mechanical), [id number]- Ionotophoresis 4mg /ml Dexamethasone, Patient/Family education, Balance training, Stair training, Taping, Dry Needling, Joint mobilization, Joint manipulation, Spinal manipulation, Spinal mobilization, Scar mobilization, Vestibular training, Visual/preceptual remediation/compensation, DME instructions, Cryotherapy, and Moist heat.  All performed as medically necessary.  All included unless contraindicated  PLAN FOR NEXT SESSION: Continued leg strengthening and control improvements.  STG assessment.    Chyrel Masson, PT, DPT, OCS, ATC 03/11/24   10:59 AM

## 2024-03-13 ENCOUNTER — Ambulatory Visit: Admitting: Rehabilitative and Restorative Service Providers"

## 2024-03-13 ENCOUNTER — Encounter: Payer: Self-pay | Admitting: Rehabilitative and Restorative Service Providers"

## 2024-03-13 DIAGNOSIS — M25562 Pain in left knee: Secondary | ICD-10-CM | POA: Diagnosis not present

## 2024-03-13 DIAGNOSIS — M25551 Pain in right hip: Secondary | ICD-10-CM

## 2024-03-13 DIAGNOSIS — R262 Difficulty in walking, not elsewhere classified: Secondary | ICD-10-CM

## 2024-03-13 DIAGNOSIS — M5459 Other low back pain: Secondary | ICD-10-CM | POA: Diagnosis not present

## 2024-03-13 DIAGNOSIS — M6281 Muscle weakness (generalized): Secondary | ICD-10-CM | POA: Diagnosis not present

## 2024-03-13 NOTE — Therapy (Signed)
 OUTPATIENT PHYSICAL THERAPY TREATMENT   Patient Name: Bobby [name] MRN: [id number] DOB:[birth year], 78 y.o., male Today's Date: 03/13/2024  END OF SESSION:  PT End of Session - 03/13/24 1527     Visit Number 4    Number of Visits 20    Date for PT Re-Evaluation 05/09/24    Authorization Type [employer] Medicare    Progress Note Due on Visit 10    PT Start Time 1525    PT Stop Time 1553    PT Time Calculation (min) 28 min    Activity Tolerance Patient tolerated treatment well    Behavior During Therapy WFL for tasks assessed/performed                Past Medical History:  Diagnosis Date   Arthritis    Right wrist   BPH (benign prostatic hypertrophy)    COPD (chronic obstructive pulmonary disease) (HCC)    emphysema   Frequency of urination    History of lung cancer    STAGE 1A NON-SMALL CELL LUNG CARCINOMA ---  04-10-2009   S/P RIGHT UPPER LOBECTOMY , NO CHEMORADIATION--  NO RECURRENCE  (ONCOLOGIST-- DR Arbutus Ped)   Hyperlipidemia    Hypertension    nscl ca dx'd 04/2009   lung   Pulmonary nodule seen on imaging study    STABLE LEFT LOWER LOBE NODULE  PER CHEST CT--  MONITORED BY DR [facility]   Sleep apnea    OSA does not use CPAP   Type 2 diabetes mellitus (HCC)    Urgency of urination    Past Surgical History:  Procedure Laterality Date   CARDIOVASCULAR STRESS TEST  04-08-2009   NORMAL NUCLEAR STUDY/ NO ISCHEMIA/ EF 60%   CARPAL TUNNEL RELEASE Right    COLONOSCOPY  last 2019   CYST EXCISION Left 10/03/2018   Procedure: EXCISION OF LEFT AXILLARY SEBACEOUS CYST;  Surgeon: Abigail Miyamoto, MD;  Location: [facility];  Service: General;  Laterality: Left;   excision of abcess     INTERCOSTAL NERVE BLOCK Left 12/16/2020   Procedure: INTERCOSTAL NERVE BLOCK;  Surgeon: Loreli Slot, MD;  Location: [facility] OR;  Service: Thoracic;  Laterality: Left;   KNEE ARTHROSCOPY W/ MENISCECTOMY Left 10-31-2004   LEFT SHOULDER ARTHROSCOPY/ ACROMIOPLASTY/  DECOMPRESSION/ SYNOVECTOMY  11-20-2007   LYMPH NODE DISSECTION Left 12/16/2020   Procedure: LYMPH NODE DISSECTION, left lung;  Surgeon: Loreli Slot, MD;  Location: [facility] OR;  Service: Thoracic;  Laterality: Left;   POLYPECTOMY     TRANSURETHRAL RESECTION OF PROSTATE N/A 11/03/2013   Procedure: TRANSURETHRAL RESECTION OF THE PROSTATE WITH GYRUS INSTRUMENTS;  Surgeon: Marcine Matar, MD;  Location: [facility];  Service: Urology;  Laterality: N/A;   TRIGGER FINGER RELEASE Right 2019   VIDEO ASSISTED THORACOSCOPY (VATS)/ LOBECTOMY Right 04-10-2009  DR Edwyna Shell   RIGHT UPPER LOBECTOMY AND NODE DISSECTION   VIDEO BRONCHOSCOPY WITH ENDOBRONCHIAL NAVIGATION N/A 12/02/2020   Procedure: VIDEO BRONCHOSCOPY WITH ENDOBRONCHIAL NAVIGATION;  Surgeon: Loreli Slot, MD;  Location: [facility] OR;  Service: Thoracic;  Laterality: N/A;   XI ROBOTIC ASSISTED THORASCOPY-LEFT LOWER LOBECTOMY (Left  12/16/2020   Patient Active Problem List   Diagnosis Date Noted   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) 03/22/2021   S/P lobectomy of lung 12/16/2020   Scapholunate advanced collapse of right wrist 06/21/2017   Trigger finger, left middle finger 06/21/2017   Right hand pain 01/18/2017   Hypertrophy of prostate with urinary obstruction and other lower  urinary tract symptoms (LUTS) 11/03/2013   Cancer of upper lobe of right lung (HCC) 06/27/2012   OBESITY, UNSPECIFIED 04/08/2009   DIZZINESS 04/08/2009   DIABETES MELLITUS, TYPE II, CONTROLLED 04/07/2009   HYPERLIPIDEMIA 04/07/2009   Essential hypertension 04/07/2009    PCP: [name], MD   REFERRING PROVIDER: Cristie Hem, PA-C   REFERRING DIAG:   Diagnosis  M25.551 (ICD-10-CM) - Pain in right hip    THERAPY DIAG:  Right hip pain  Other low back pain  Acute pain of left knee  Muscle weakness (generalized)  Difficulty in walking, not elsewhere classified  Rationale for Evaluation and Treatment:  Rehabilitation  ONSET DATE: Hip and back over 1 month, left knee off and on for a few months  SUBJECTIVE:   SUBJECTIVE STATEMENT: Pt indicated feeling sore but overall less pain that previously.   PERTINENT HISTORY: COPD, h/o lung cancer, BPH, arthritis, HTN, DM2, Left knee arthroscopy c meniscectomy, Left shoulder arthroscopy 2008, left lower lobe lobectomy See PMH above   PAIN:  NPRS scale: sore not pain.  Pain location: rt side low back, Rt hip, left knee Pain description: achy, soreness Aggravating factors: walking, standing, bending, driving Relieving factors: changing positions, resting  PRECAUTIONS: Other: no E-stim over lung field due to h/o cancer  WEIGHT BEARING RESTRICTIONS: No  FALLS:  Has patient fallen in last 6 months? No  LIVING ENVIRONMENT: Lives with: lives with their family and lives with their spouse Lives in: House/apartment Stairs: Yes: Internal: 15 steps; on right going up Has following equipment at home:  raised toilet  OCCUPATION: retired  PLOF: Independent  PATIENT GOALS: be able to lift my leg to drive without pain, walk without pain  Next MD visit:   OBJECTIVE:   DIAGNOSTIC FINDINGS: 02/06/24 X-rays demonstrate advanced multilevel degenerative changes worse L5-S1    PATIENT SURVEYS:  Patient-Specific Activity Scoring Scheme  "0" represents "unable to perform." "10" represents "able to perform at prior level. 0 1 2 3 4 5 6 7 8 9  10 (Date and Score)   Activity Eval  02/25/24    1. Walking 5     2. driving 5     3. Climbing step 4   4.     5.    Score 14/3= 4.7    Total score = sum of the activity scores/number of activities Minimum detectable change (90%CI) for average score = 2 points Minimum detectable change (90%CI) for single activity score = 3 points  COGNITION: Overall cognitive status: WFL    SENSATION: WFL   POSTURE:  rounded shoulders, forward head, and decreased lumbar lordosis  PALPATION: TTP: lateral Rt  hip and Rt QL  LOWER EXTREMITY ROM:   ROM Right 02/25/24 Supine  active Left 02/25/24 Supine active  Hip flexion 85 82  Hip extension    Hip abduction    Hip adduction    Hip internal rotation    Hip external rotation    Knee flexion 118 114  Knee extension 0 -6  Ankle dorsiflexion    Ankle plantarflexion    Ankle inversion    Ankle eversion     (Blank rows = not tested)  Lumbar ROM:  02/28/2024: Lumbar extension 25 % WFL with pain noted in Rt lumbar/posterior hip.  X 5 in standing produced similar pain each time.    02/25/2024 eval: Lumbar flexion: 60 degrees Lumbar extension: 20 degrees Side Bending to Rt: 28 degrees Side bending left: 22 degrees  LOWER EXTREMITY MMT:  MMT  Right eval Left eval Right 03/13/2024 Left 03/13/2024  Hip flexion 4 4 5/5 5/5  Hip extension      Hip abduction 4 4    Hip adduction 4 4    Hip internal rotation      Hip external rotation      Knee flexion 5 4 5/5 5/5  Knee extension 5 4- 5/5 5/5  Ankle dorsiflexion      Ankle plantarflexion      Ankle inversion      Ankle eversion       (Blank rows = not tested)  LOWER EXTREMITY SPECIAL TESTS:  02/26/2024 Slump test: Rt: negative, Left negative  FUNCTIONAL TESTS:  02/28/2024:  18 inch chair transfer s UE assist with fair control in lowering.   02/25/24: 5 times sit to stand: 16.11 seconds UE support  GAIT:  03/11/2024:  Deviation in stance on Lt leg c maintained knee flexion noted.   02/25/24 Distance walked: clinic distances Assistive device utilized: None Level of assistance: Complete Independence Comments: decreased heel to toe gait pattern, mild forward flexion                                                                                                                                                                        TODAY'S TREATMENT                                                                          DATE: 03/13/2024 Therex: Nustep Lvl 6 UE/LE 10 mins   Incline gastroc stretch 30 sec x 3 bilateral  Verbal review of at home leg strengthening activity  TherActivity(to improve squatting, transfers, stair navigation) Leg press double leg  87 lbs x 15  , single leg  2 x 15 37 lbs,  performed bilaterally - performed without knee pain and in range available without pain in flexion.   Neuro Re-ed: (to improve neuro muscular recruitment, balance improvements for WB control) Tandem stance 1 min x 2 bilateral with occasional HHA on bar - time spent in instruction Standing Lt leg quad set TKE muscle recruitment green band 5 sec hold 2 x 10   TODAY'S TREATMENT  DATE: 03/11/2024 Therex: Supine lumbar trunk rotation stretch 15 sec x 3 bilateral Supine bridge with green band hip abduction hold in movement 2 x 10 2-3 sec hold  Supine hooklying hip abduction clam shell green band with contralateral leg isometric hold 2 x 20 bilateral  Nustep Lvl 6 UE/LE 8 mins  Incline gastroc stretch 30 sec x 3 bilateral  Verbal review of at home leg strengthening activity  TherActivity(to improve squatting, transfers, stair navigation) Leg press double leg  81 lbs x 15  , single leg  2 x 15 37 lbs,  performed bilaterally - performed without knee pain and in range available without pain in flexion.   Neuro Re-ed: (to improve neuro muscular recruitment, balance improvements for WB control) Tandem stance 1 min x 1 bilateral with occasional HHA on bar - time spent in instruction  TODAY'S TREATMENT                                                                          DATE: 02/28/2024 Therex: Supine lumbar trunk rotation stretch 15 sec x 3 bilateral Supine bridge with green band hip abduction hold in movement 2 x 10 2-3 sec hold  Supine hooklying hip abduction clam shell green band with contralateral leg isometric hold x 20 bilateral  Supine SLR x 10 bilateral   Nustep Lvl 5 UE/LE 8 mins  Sit to  stand to sit no UE assist x 10 from 22 inch table height  Review of existing HEP c verbal cues for techniques.   Neuro Re-ed: (to improve neuro muscular recruitment) Supine quad set 5 sec hold x 10 bilateral  Seated quad set instruction for daily use in chair if desired.  Performed 5 sec hold 2-3 as demo in clinic.    TODAY'S TREATMENT                                                                          DATE: 02/25/24:  Therex: HEP instruction/performance c cues for techniques, handout provided.  Trial set performed of each for comprehension and symptom assessment.  See below for exercise list  PATIENT EDUCATION:  Education details: HEP, POC Person educated: Patient Education method: Explanation, Demonstration, Verbal cues, and Handouts Education comprehension: verbalized understanding, returned demonstration, and verbal cues required  HOME EXERCISE PROGRAM: Access Code: [email or web address] URL: [email or web address] Date: 02/25/2024 Prepared by: Narda Amber  Exercises - Supine Bridge  - 2 x daily - 7 x weekly - 3 sets - 10 reps - 5 seconds hold - Supine Lower Trunk Rotation  - 2 x daily - 7 x weekly - 3 sets - 10 reps - Long Sitting Quad Set with Towel Roll Under Heel  - 2 x daily - 7 x weekly - 10 reps - 5 seconds hold - Supine Active Straight Leg Raise  - 2 x daily - 7 x weekly - 10 reps  ASSESSMENT:  CLINICAL IMPRESSION: Positive reports for symptoms in time since last  visit.  Recheck of LE strength showed some improvements as noted.  Continued progressive strengthening program to help improve load on Lt knee indicated at this time.   Reduced treatment time today due to arrival time/check in time.   OBJECTIVE IMPAIRMENTS: decreased balance, difficulty walking, decreased ROM, decreased strength, and pain.   ACTIVITY LIMITATIONS: lifting, bending, sitting, standing, squatting, stairs, and transfers  PARTICIPATION LIMITATIONS: cleaning, laundry, driving, and  community activity  PERSONAL FACTORS: 3+ comorbidities: see PMH above  are also affecting patient's functional outcome.   REHAB POTENTIAL: Good  CLINICAL DECISION MAKING: Stable/uncomplicated  EVALUATION COMPLEXITY: Moderate   GOALS: Goals reviewed with patient? Yes  SHORT TERM GOALS: (target date for Short term goals are 3 weeks 03/17/2024)   1.  Patient will demonstrate independent use of home exercise program to maintain progress from in clinic treatments.  Goal status: Met  LONG TERM GOALS: (target dates for all long term goals are 10 weeks  05/09/2024)   1. Patient will demonstrate/report pain at worst less than or equal to 2/10 to facilitate minimal limitation in daily activity secondary to pain symptoms.  Goal status: New   2. Patient will demonstrate independent use of home exercise program to facilitate ability to maintain/progress functional gains from skilled physical therapy services.  Goal status: New   3. Patient will demonstrate Patient specific functional scale avg > or = 6.7 to indicate reduced disability due to condition.   Goal status: New   4.  Patient will demonstrate bil  LE MMT >/= 4+/5 throughout to faciltiate usual transfers, stairs, squatting at [facility] for daily life.   Goal status: New   5.  Patient will demonstrate reciprocal gait pattern up and down 1 flight stairs with single hand rail  Goal status: New   6.  Pt will report being able to walk 1/4 a mile in </= 20 minutes with pain </= 3/10.  Goal status: New   7.  Pt will perform 5 time sit to stand in </= 13 seconds with no UE support.  Goal Status: New   PLAN:  PT FREQUENCY: 1-2x/week  PT DURATION: 10 weeks  PLANNED INTERVENTIONS: Can include 09811- PT Re-evaluation, 97110-Therapeutic exercises, 97530- Therapeutic activity, 97112- Neuromuscular re-education, 97535- Self Care, 97140- Manual therapy, [phone]- Gait training, [phone]- Orthotic Fit/training, [phone]- Canalith repositioning, [id number]-  Aquatic Therapy, [phone]- Electrical stimulation (unattended), [phone]- Electrical stimulation (manual), [id number] Physical performance testing, 97016- Vasopneumatic device, [id number]- Ultrasound, [id number]- Traction (mechanical), [id number]- Ionotophoresis 4mg /ml Dexamethasone, Patient/Family education, Balance training, Stair training, Taping, Dry Needling, Joint mobilization, Joint manipulation, Spinal manipulation, Spinal mobilization, Scar mobilization, Vestibular training, Visual/preceptual remediation/compensation, DME instructions, Cryotherapy, and Moist heat.  All performed as medically necessary.  All included unless contraindicated  PLAN FOR NEXT SESSION: Dynamometry check.    Chyrel Masson, PT, DPT, OCS, ATC 03/13/24  3:54 PM

## 2024-03-16 ENCOUNTER — Encounter: Payer: Self-pay | Admitting: Podiatry

## 2024-03-16 NOTE — Progress Notes (Signed)
 Subjective: Bobby [name] presents today  for diabetic foot evaluation and with chief concern of diabetes with elongated, thickened, painful, discolored toenails for months. Aggravating factor(s) include wearing enclosed shoe gear. Patient has tried self attempt at trimming toenail.. Chief Complaint  Patient presents with   rfc    He is here for diabetic nail trim, last A1C was 7.5, Pcp is Dr polite,    Patient denies any h/o foot wounds.  PCP is Merl Star, MD , and last visit was January 15, 2024.  Past Medical History:  Diagnosis Date   Arthritis    Right wrist   BPH (benign prostatic hypertrophy)    COPD (chronic obstructive pulmonary disease) (HCC)    emphysema   Frequency of urination    History of lung cancer    STAGE 1A NON-SMALL CELL LUNG CARCINOMA ---  04-10-2009   S/P RIGHT UPPER LOBECTOMY , NO CHEMORADIATION--  NO RECURRENCE  (ONCOLOGIST-- DR Marguerita Shih)   Hyperlipidemia    Hypertension    nscl ca dx'd 04/2009   lung   Pulmonary nodule seen on imaging study    STABLE LEFT LOWER LOBE NODULE  PER CHEST CT--  MONITORED BY DR [facility]   Sleep apnea    OSA does not use CPAP   Type 2 diabetes mellitus (HCC)    Urgency of urination     Patient Active Problem List   Diagnosis Date Noted   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) 03/22/2021   S/P lobectomy of lung 12/16/2020   Scapholunate advanced collapse of right wrist 06/21/2017   Trigger finger, left middle finger 06/21/2017   Right hand pain 01/18/2017   Hypertrophy of prostate with urinary obstruction and other lower urinary tract symptoms (LUTS) 11/03/2013   Cancer of upper lobe of right lung (HCC) 06/27/2012   OBESITY, UNSPECIFIED 04/08/2009   DIZZINESS 04/08/2009   DIABETES MELLITUS, TYPE II, CONTROLLED 04/07/2009   HYPERLIPIDEMIA 04/07/2009   Essential hypertension 04/07/2009    Past Surgical History:  Procedure Laterality Date   CARDIOVASCULAR STRESS TEST  04-08-2009   NORMAL  NUCLEAR STUDY/ NO ISCHEMIA/ EF 60%   CARPAL TUNNEL RELEASE Right    COLONOSCOPY  last 2019   CYST EXCISION Left 10/03/2018   Procedure: EXCISION OF LEFT AXILLARY SEBACEOUS CYST;  Surgeon: Oza Blumenthal, MD;  Location: [facility];  Service: General;  Laterality: Left;   excision of abcess     INTERCOSTAL NERVE BLOCK Left 12/16/2020   Procedure: INTERCOSTAL NERVE BLOCK;  Surgeon: Zelphia Higashi, MD;  Location: [facility] OR;  Service: Thoracic;  Laterality: Left;   KNEE ARTHROSCOPY W/ MENISCECTOMY Left 10-31-2004   LEFT SHOULDER ARTHROSCOPY/ ACROMIOPLASTY/ DECOMPRESSION/ SYNOVECTOMY  11-20-2007   LYMPH NODE DISSECTION Left 12/16/2020   Procedure: LYMPH NODE DISSECTION, left lung;  Surgeon: Zelphia Higashi, MD;  Location: [facility] OR;  Service: Thoracic;  Laterality: Left;   POLYPECTOMY     TRANSURETHRAL RESECTION OF PROSTATE N/A 11/03/2013   Procedure: TRANSURETHRAL RESECTION OF THE PROSTATE WITH GYRUS INSTRUMENTS;  Surgeon: Trent Frizzle, MD;  Location: [facility];  Service: Urology;  Laterality: N/A;   TRIGGER FINGER RELEASE Right 2019   VIDEO ASSISTED THORACOSCOPY (VATS)/ LOBECTOMY Right 04-10-2009  DR Percy Bracken   RIGHT UPPER LOBECTOMY AND NODE DISSECTION   VIDEO BRONCHOSCOPY WITH ENDOBRONCHIAL NAVIGATION N/A 12/02/2020   Procedure: VIDEO BRONCHOSCOPY WITH ENDOBRONCHIAL NAVIGATION;  Surgeon: Zelphia Higashi, MD;  Location: [facility] OR;  Service: Thoracic;  Laterality: N/A;   XI  ROBOTIC ASSISTED THORASCOPY-LEFT LOWER LOBECTOMY (Left  12/16/2020    Current Outpatient Medications on File Prior to Visit  Medication Sig Dispense Refill   allopurinol (ZYLOPRIM) 100 MG tablet Take 100 mg by mouth daily.     amLODipine-benazepril (LOTREL) 5-20 MG per capsule Take 1 capsule by mouth every morning.      Cholecalciferol (VITAMIN D3) 50 MCG (2000 UT) TABS Take 2,000 Units by mouth daily.     colchicine 0.6 MG tablet Take 0.6 mg by mouth daily.     fluticasone  (FLONASE) 50 MCG/ACT nasal spray Place 1 spray into both nostrils daily as needed for allergies.     fluticasone furoate-vilanterol (BREO ELLIPTA) 100-25 MCG/ACT AEPB Inhale 1 puff into the lungs daily.     metFORMIN (GLUCOPHAGE) 500 MG tablet Take 500 mg by mouth 2 (two) times daily.     methocarbamol (ROBAXIN-750) 750 MG tablet Take 1 tablet (750 mg total) by mouth 2 (two) times daily as needed for muscle spasms. 20 tablet 1   simvastatin (ZOCOR) 20 MG tablet Take 20 mg by mouth every evening.  1   No current facility-administered medications on file prior to visit.     No Known Allergies  Social History   Occupational History   Not on file  Tobacco Use   Smoking status: Former    Current packs/day: 0.00    Types: Cigarettes    Start date: 05/28/1980    Quit date: 05/28/2000    Years since quitting: 23.8   Smokeless tobacco: Never  Vaping Use   Vaping status: Never Used  Substance and Sexual Activity   Alcohol use: Not Currently   Drug use: No   Sexual activity: Not on file    Family History  Problem Relation Age of Onset   Colon polyps Brother    Colitis Neg Hx    Colon cancer Neg Hx    Esophageal cancer Neg Hx    Rectal cancer Neg Hx    Stomach cancer Neg Hx    Crohn's disease Neg Hx    Ulcerative colitis Neg Hx     Immunization History  Administered Date(s) Administered   Fluad Quad(high Dose 65+) 09/06/2021   Fluzone Influenza virus vaccine,trivalent (IIV3), split virus 09/03/2013, 09/25/2023   Influenza, High Dose Seasonal PF 09/17/2014, 09/03/2017   Influenza,inj,Quad PF,6+ Mos 08/13/2019   Influenza-Unspecified 09/15/2019   PFIZER Comirnaty(Gray Top)Covid-19 Tri-Sucrose Vaccine 06/21/2021   PFIZER(Purple Top)SARS-COV-2 Vaccination 12/26/2019, 01/16/2020, 09/13/2020   PNEUMOCOCCAL CONJUGATE-20 06/15/2023   Pfizer Covid-19 Vaccine Bivalent Booster 62yrs & up 11/08/2021   Pneumococcal Conjugate-13 04/05/2017   Respiratory Syncytial Virus Vaccine,Recomb  Aduvanted(Arexvy) 09/30/2022   Tdap 04/06/2016   Zoster Recombinant(Shingrix) 10/29/2020, 10/03/2021    Objective: There were no vitals filed for this visit.  Bobby [name] is a pleasant 78 y.o. male obese in NAD. AAO X 3.   Title   Diabetic Foot Exam - detailed Date & Time: 03/11/2024  3:00 PM Diabetic Foot exam was performed with the following findings: Yes  Visual Foot Exam completed.: Yes  Is there a history of foot ulcer?: No Is there a foot ulcer now?: No Is there swelling?: No Is there elevated skin temperature?: No Is there abnormal foot shape?: No Is there a claw toe deformity?: No Are the toenails long?: Yes Are the toenails thick?: Yes Are the toenails ingrown?: No Is the skin thin, fragile, shiny and hairless?": No Normal Range of Motion?: Yes Is there foot or ankle muscle weakness?: No Do  you have pain in calf while walking?: No Are the shoes appropriate in style and fit?: Yes Can the patient see the bottom of their feet?: No Pulse Foot Exam completed.: Yes   Right Posterior Tibialis: Present Left posterior Tibialis: Present   Right Dorsalis Pedis: Present Left Dorsalis Pedis: Present     Sensory Foot Exam Completed.: Yes Semmes-Weinstein Monofilament Test "+" means "has sensation" and "-" means "no sensation"   R Site 1-Great Toe: Pos L Site 1-Great Toe: Pos   R Site 4: Pos L Site 4: Pos   R site 5: Pos L Site 5: Pos  R Site 6: Pos L Site 6: Pos     Image components are not supported.   Image components are not supported. Image components are not supported.  Tuning Fork Right vibratory: present Left vibratory: present  Comments Pes planus b/l.      Lab Results  Component Value Date   HGBA1C 6.2 (H) 11/30/2020   Assessment: 1. Pain due to onychomycosis of toenails of both feet   2. Pes planus of both feet   3. Controlled type 2 diabetes mellitus without complication, without long-term current use of insulin (HCC)   4. Encounter for  diabetic foot exam (HCC)    ADA Risk Categorization: Low Risk:  Patient has all of the following: Intact protective sensation No prior foot ulcer  No severe deformity Pedal pulses present  Plan: Diabetic foot examination performed today. All patient's and/or POA's questions/concerns addressed on today's visit. Toenails 1-5 debrided in length and girth without incident. Continue foot and shoe inspections daily. Monitor blood glucose per PCP/Endocrinologist's recommendations. Continue soft, supportive shoe gear daily. Report any pedal injuries to medical professional. Call office if there are any questions/concerns. -Patient/POA to call should there be question/concern in the interim.  Return in about 3 months (around 06/10/2024).  Luella Sager, DPM      [place] LOCATION: [address] [place] [address]                   Office [phone]   [facility] LOCATION: [address] [place], [place] [address] Office [phone]

## 2024-03-18 ENCOUNTER — Ambulatory Visit (INDEPENDENT_AMBULATORY_CARE_PROVIDER_SITE_OTHER): Admitting: Rehabilitative and Restorative Service Providers"

## 2024-03-18 ENCOUNTER — Encounter: Payer: Self-pay | Admitting: Rehabilitative and Restorative Service Providers"

## 2024-03-18 DIAGNOSIS — M25562 Pain in left knee: Secondary | ICD-10-CM | POA: Diagnosis not present

## 2024-03-18 DIAGNOSIS — R262 Difficulty in walking, not elsewhere classified: Secondary | ICD-10-CM

## 2024-03-18 DIAGNOSIS — M5459 Other low back pain: Secondary | ICD-10-CM

## 2024-03-18 DIAGNOSIS — M25551 Pain in right hip: Secondary | ICD-10-CM | POA: Diagnosis not present

## 2024-03-18 DIAGNOSIS — M6281 Muscle weakness (generalized): Secondary | ICD-10-CM | POA: Diagnosis not present

## 2024-03-18 NOTE — Therapy (Signed)
 OUTPATIENT PHYSICAL THERAPY TREATMENT   Patient Name: Bobby [name] MRN: [id number] DOB:26-Mar-1946, 78 y.o., male Today's Date: 03/18/2024  END OF SESSION:  PT End of Session - 03/18/24 1443     Visit Number 5    Number of Visits 20    Date for PT Re-Evaluation 05/09/24    Authorization Type [employer]    Progress Note Due on Visit 10    PT Start Time 1438    PT Stop Time 1516    PT Time Calculation (min) 38 min    Activity Tolerance Patient tolerated treatment well    Behavior During Therapy WFL for tasks assessed/performed                 Past Medical History:  Diagnosis Date   Arthritis    Right wrist   BPH (benign prostatic hypertrophy)    COPD (chronic obstructive pulmonary disease) (HCC)    emphysema   Frequency of urination    History of lung cancer    STAGE 1A NON-SMALL CELL LUNG CARCINOMA ---  04-10-2009   S/P RIGHT UPPER LOBECTOMY , NO CHEMORADIATION--  NO RECURRENCE  (ONCOLOGIST-- DR Marguerita Shih)   Hyperlipidemia    Hypertension    nscl ca dx'd 04/2009   lung   Pulmonary nodule seen on imaging study    STABLE LEFT LOWER LOBE NODULE  PER CHEST CT--  MONITORED BY DR [facility]   Sleep apnea    OSA does not use CPAP   Type 2 diabetes mellitus (HCC)    Urgency of urination    Past Surgical History:  Procedure Laterality Date   CARDIOVASCULAR STRESS TEST  04-08-2009   NORMAL NUCLEAR STUDY/ NO ISCHEMIA/ EF 60%   CARPAL TUNNEL RELEASE Right    COLONOSCOPY  last 2019   CYST EXCISION Left 10/03/2018   Procedure: EXCISION OF LEFT AXILLARY SEBACEOUS CYST;  Surgeon: Oza Blumenthal, MD;  Location: [facility];  Service: General;  Laterality: Left;   excision of abcess     INTERCOSTAL NERVE BLOCK Left 12/16/2020   Procedure: INTERCOSTAL NERVE BLOCK;  Surgeon: Zelphia Higashi, MD;  Location: [facility] OR;  Service: Thoracic;  Laterality: Left;   KNEE ARTHROSCOPY W/ MENISCECTOMY Left 10-31-2004   LEFT SHOULDER ARTHROSCOPY/ ACROMIOPLASTY/  DECOMPRESSION/ SYNOVECTOMY  11-20-2007   LYMPH NODE DISSECTION Left 12/16/2020   Procedure: LYMPH NODE DISSECTION, left lung;  Surgeon: Zelphia Higashi, MD;  Location: [facility] OR;  Service: Thoracic;  Laterality: Left;   POLYPECTOMY     TRANSURETHRAL RESECTION OF PROSTATE N/A 11/03/2013   Procedure: TRANSURETHRAL RESECTION OF THE PROSTATE WITH GYRUS INSTRUMENTS;  Surgeon: Trent Frizzle, MD;  Location: [facility];  Service: Urology;  Laterality: N/A;   TRIGGER FINGER RELEASE Right 2019   VIDEO ASSISTED THORACOSCOPY (VATS)/ LOBECTOMY Right 04-10-2009  DR Percy Bracken   RIGHT UPPER LOBECTOMY AND NODE DISSECTION   VIDEO BRONCHOSCOPY WITH ENDOBRONCHIAL NAVIGATION N/A 12/02/2020   Procedure: VIDEO BRONCHOSCOPY WITH ENDOBRONCHIAL NAVIGATION;  Surgeon: Zelphia Higashi, MD;  Location: [facility] OR;  Service: Thoracic;  Laterality: N/A;   XI ROBOTIC ASSISTED THORASCOPY-LEFT LOWER LOBECTOMY (Left  12/16/2020   Patient Active Problem List   Diagnosis Date Noted   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) 03/22/2021   S/P lobectomy of lung 12/16/2020   Scapholunate advanced collapse of right wrist 06/21/2017   Trigger finger, left middle finger 06/21/2017   Right hand pain 01/18/2017   Hypertrophy of prostate with urinary obstruction and other  lower urinary tract symptoms (LUTS) 11/03/2013   Cancer of upper lobe of right lung (HCC) 06/27/2012   OBESITY, UNSPECIFIED 04/08/2009   DIZZINESS 04/08/2009   DIABETES MELLITUS, TYPE II, CONTROLLED 04/07/2009   HYPERLIPIDEMIA 04/07/2009   Essential hypertension 04/07/2009    PCP: [name], MD   REFERRING PROVIDER: Cristie Hem, PA-C   REFERRING DIAG:   Diagnosis  M25.551 (ICD-10-CM) - Pain in right hip    THERAPY DIAG:  Right hip pain  Other low back pain  Acute pain of left knee  Muscle weakness (generalized)  Difficulty in walking, not elsewhere classified  Rationale for Evaluation and Treatment:  Rehabilitation  ONSET DATE: Hip and back over 1 month, left knee off and on for a few months  SUBJECTIVE:   SUBJECTIVE STATEMENT: Pt indicated having increase Lt knee trouble with standing, walking since Sunday.  Reported nothing specific came to mind from Sunday.    PERTINENT HISTORY: COPD, h/o lung cancer, BPH, arthritis, HTN, DM2, Left knee arthroscopy c meniscectomy, Left shoulder arthroscopy 2008, left lower lobe lobectomy See PMH above   PAIN:  NPRS scale:  up to 7/10.    Pain location: Lt knee Pain description: achy, soreness Aggravating factors: walking, standing, bending, driving Relieving factors: changing positions, resting  PRECAUTIONS: Other: no E-stim over lung field due to h/o cancer  WEIGHT BEARING RESTRICTIONS: No  FALLS:  Has patient fallen in last 6 months? No  LIVING ENVIRONMENT: Lives with: lives with their family and lives with their spouse Lives in: House/apartment Stairs: Yes: Internal: 15 steps; on right going up Has following equipment at home:  raised toilet  OCCUPATION: retired  PLOF: Independent  PATIENT GOALS: be able to lift my leg to drive without pain, walk without pain  Next MD visit:   OBJECTIVE:   DIAGNOSTIC FINDINGS: 02/06/24 X-rays demonstrate advanced multilevel degenerative changes worse L5-S1    PATIENT SURVEYS:  Patient-Specific Activity Scoring Scheme  "0" represents "unable to perform." "10" represents "able to perform at prior level. 0 1 2 3 4 5 6 7 8 9  10 (Date and Score)   Activity Eval  02/25/24    1. Walking 5     2. driving 5     3. Climbing step 4   4.     5.    Score 14/3= 4.7    Total score = sum of the activity scores/number of activities Minimum detectable change (90%CI) for average score = 2 points Minimum detectable change (90%CI) for single activity score = 3 points  COGNITION: Overall cognitive status: WFL    SENSATION: WFL   POSTURE:  rounded shoulders, forward head, and decreased  lumbar lordosis  PALPATION: TTP: lateral Rt hip and Rt QL  LOWER EXTREMITY ROM:   ROM Right 02/25/24 Supine  active Left 02/25/24 Supine active  Hip flexion 85 82  Hip extension    Hip abduction    Hip adduction    Hip internal rotation    Hip external rotation    Knee flexion 118 114  Knee extension 0 -6  Ankle dorsiflexion    Ankle plantarflexion    Ankle inversion    Ankle eversion     (Blank rows = not tested)  Lumbar ROM:  02/28/2024: Lumbar extension 25 % WFL with pain noted in Rt lumbar/posterior hip.  X 5 in standing produced similar pain each time.    02/25/2024 eval: Lumbar flexion: 60 degrees Lumbar extension: 20 degrees Side Bending to Rt: 28 degrees Side  bending left: 22 degrees  LOWER EXTREMITY MMT:  MMT Right eval Left eval Right 03/13/2024 Left 03/13/2024  Hip flexion 4 4 5/5 5/5  Hip extension      Hip abduction 4 4    Hip adduction 4 4    Hip internal rotation      Hip external rotation      Knee flexion 5 4 5/5 5/5  Knee extension 5 4- 5/5 5/5  Ankle dorsiflexion      Ankle plantarflexion      Ankle inversion      Ankle eversion       (Blank rows = not tested)  LOWER EXTREMITY SPECIAL TESTS:  02/26/2024 Slump test: Rt: negative, Left negative  FUNCTIONAL TESTS:  02/28/2024:  18 inch chair transfer s UE assist with fair control in lowering.   02/25/24: 5 times sit to stand: 16.11 seconds UE support  GAIT:  03/11/2024:  Deviation in stance on Lt leg c maintained knee flexion noted.   02/25/24 Distance walked: clinic distances Assistive device utilized: None Level of assistance: Complete Independence Comments: decreased heel to toe gait pattern, mild forward flexion                                                                                                                                                                        TODAY'S TREATMENT                                                                          DATE:  03/18/2024 Manual: Percussive device STM to Rt lower lumbar, superior glute max, glute med/min on Rt side  Therex: Supine quad set 5 sec hold x 10 bilateral Supine lumbar trunk rotation stretch 15 sec x 3 bilateral  Seated quad set c SLR x 10 (additional time for cues for activity) Nustep lvl 6 10 min UE/LE Gastroc stretch incline 30 sec x 3   Additional time spent in education of techniques of intervention.   TODAY'S TREATMENT                                                                          DATE: 03/13/2024 Therex: Nustep Lvl 6 UE/LE  10 mins  Incline gastroc stretch 30 sec x 3 bilateral  Verbal review of at home leg strengthening activity  TherActivity(to improve squatting, transfers, stair navigation) Leg press double leg  87 lbs x 15  , single leg  2 x 15 37 lbs,  performed bilaterally - performed without knee pain and in range available without pain in flexion.   Neuro Re-ed: (to improve neuro muscular recruitment, balance improvements for WB control) Tandem stance 1 min x 2 bilateral with occasional HHA on bar - time spent in instruction Standing Lt leg quad set TKE muscle recruitment green band 5 sec hold 2 x 10   TODAY'S TREATMENT                                                                          DATE: 03/11/2024 Therex: Supine lumbar trunk rotation stretch 15 sec x 3 bilateral Supine bridge with green band hip abduction hold in movement 2 x 10 2-3 sec hold  Supine hooklying hip abduction clam shell green band with contralateral leg isometric hold 2 x 20 bilateral  Nustep Lvl 6 UE/LE 8 mins  Incline gastroc stretch 30 sec x 3 bilateral  Verbal review of at home leg strengthening activity  TherActivity(to improve squatting, transfers, stair navigation) Leg press double leg  81 lbs x 15  , single leg  2 x 15 37 lbs,  performed bilaterally - performed without knee pain and in range available without pain in flexion.   Neuro Re-ed: (to improve neuro muscular  recruitment, balance improvements for WB control) Tandem stance 1 min x 1 bilateral with occasional HHA on bar - time spent in instruction  TODAY'S TREATMENT                                                                          DATE: 02/28/2024 Therex: Supine lumbar trunk rotation stretch 15 sec x 3 bilateral Supine bridge with green band hip abduction hold in movement 2 x 10 2-3 sec hold  Supine hooklying hip abduction clam shell green band with contralateral leg isometric hold x 20 bilateral  Supine SLR x 10 bilateral   Nustep Lvl 5 UE/LE 8 mins  Sit to stand to sit no UE assist x 10 from 22 inch table height  Review of existing HEP c verbal cues for techniques.   Neuro Re-ed: (to improve neuro muscular recruitment) Supine quad set 5 sec hold x 10 bilateral  Seated quad set instruction for daily use in chair if desired.  Performed 5 sec hold 2-3 as demo in clinic.   PATIENT EDUCATION:  Education details: HEP, POC Person educated: Patient Education method: Programmer, multimedia, Demonstration, Verbal cues, and Handouts Education comprehension: verbalized understanding, returned demonstration, and verbal cues required  HOME EXERCISE PROGRAM: Access Code: [email or web address] URL: [email or web address] Date: 02/25/2024 Prepared by: Jerrel Mor  Exercises - Supine Bridge  - 2 x daily - 7 x weekly - 3 sets - 10 reps -  5 seconds hold - Supine Lower Trunk Rotation  - 2 x daily - 7 x weekly - 3 sets - 10 reps - Long Sitting Quad Set with Towel Roll Under Heel  - 2 x daily - 7 x weekly - 10 reps - 5 seconds hold - Supine Active Straight Leg Raise  - 2 x daily - 7 x weekly - 10 reps  ASSESSMENT:  CLINICAL IMPRESSION: Arrival today with knee pain impacting stance and ambulation with Lt leg.  Reduced WB activity today due to symptom presentation.  Continued emphasis on building strength and control within leg to improve WB tolerance.    OBJECTIVE IMPAIRMENTS: decreased balance,  difficulty walking, decreased ROM, decreased strength, and pain.   ACTIVITY LIMITATIONS: lifting, bending, sitting, standing, squatting, stairs, and transfers  PARTICIPATION LIMITATIONS: cleaning, laundry, driving, and community activity  PERSONAL FACTORS: 3+ comorbidities: see PMH above  are also affecting patient's functional outcome.   REHAB POTENTIAL: Good  CLINICAL DECISION MAKING: Stable/uncomplicated  EVALUATION COMPLEXITY: Moderate   GOALS: Goals reviewed with patient? Yes  SHORT TERM GOALS: (target date for Short term goals are 3 weeks 03/17/2024)   1.  Patient will demonstrate independent use of home exercise program to maintain progress from in clinic treatments.  Goal status: Met  LONG TERM GOALS: (target dates for all long term goals are 10 weeks  05/09/2024)   1. Patient will demonstrate/report pain at worst less than or equal to 2/10 to facilitate minimal limitation in daily activity secondary to pain symptoms.  Goal status: New   2. Patient will demonstrate independent use of home exercise program to facilitate ability to maintain/progress functional gains from skilled physical therapy services.  Goal status: New   3. Patient will demonstrate Patient specific functional scale avg > or = 6.7 to indicate reduced disability due to condition.   Goal status: New   4.  Patient will demonstrate bil  LE MMT >/= 4+/5 throughout to faciltiate usual transfers, stairs, squatting at [facility] for daily life.   Goal status: New   5.  Patient will demonstrate reciprocal gait pattern up and down 1 flight stairs with single hand rail  Goal status: New   6.  Pt will report being able to walk 1/4 a mile in </= 20 minutes with pain </= 3/10.  Goal status: New   7.  Pt will perform 5 time sit to stand in </= 13 seconds with no UE support.  Goal Status: New   PLAN:  PT FREQUENCY: 1-2x/week  PT DURATION: 10 weeks  PLANNED INTERVENTIONS: Can include 40981- PT Re-evaluation,  97110-Therapeutic exercises, 97530- Therapeutic activity, 97112- Neuromuscular re-education, 97535- Self Care, 97140- Manual therapy, [phone]- Gait training, [phone]- Orthotic Fit/training, [phone]- Canalith repositioning, [id number]- Aquatic Therapy, [phone]- Electrical stimulation (unattended), [phone]- Electrical stimulation (manual), [id number] Physical performance testing, 97016- Vasopneumatic device, [id number]- Ultrasound, [id number]- Traction (mechanical), [id number]- Ionotophoresis 4mg /ml Dexamethasone, Patient/Family education, Balance training, Stair training, Taping, Dry Needling, Joint mobilization, Joint manipulation, Spinal manipulation, Spinal mobilization, Scar mobilization, Vestibular training, Visual/preceptual remediation/compensation, DME instructions, Cryotherapy, and Moist heat.  All performed as medically necessary.  All included unless contraindicated  PLAN FOR NEXT SESSION:  Resume WB strengthening as able with Lt knee.    Chyrel Masson, PT, DPT, OCS, ATC 03/18/24  3:39 PM

## 2024-03-20 ENCOUNTER — Encounter: Payer: Self-pay | Admitting: Rehabilitative and Restorative Service Providers"

## 2024-03-20 ENCOUNTER — Ambulatory Visit (INDEPENDENT_AMBULATORY_CARE_PROVIDER_SITE_OTHER): Admitting: Rehabilitative and Restorative Service Providers"

## 2024-03-20 DIAGNOSIS — M25551 Pain in right hip: Secondary | ICD-10-CM | POA: Diagnosis not present

## 2024-03-20 DIAGNOSIS — M6281 Muscle weakness (generalized): Secondary | ICD-10-CM | POA: Diagnosis not present

## 2024-03-20 DIAGNOSIS — R262 Difficulty in walking, not elsewhere classified: Secondary | ICD-10-CM

## 2024-03-20 DIAGNOSIS — M25562 Pain in left knee: Secondary | ICD-10-CM | POA: Diagnosis not present

## 2024-03-20 DIAGNOSIS — M5459 Other low back pain: Secondary | ICD-10-CM | POA: Diagnosis not present

## 2024-03-20 NOTE — Therapy (Signed)
 OUTPATIENT PHYSICAL THERAPY TREATMENT   Patient Name: Bobby [name] MRN: [id number] DOB:Aug 05, 1946, 78 y.o., male Today's Date: 03/20/2024  END OF SESSION:  PT End of Session - 03/20/24 1442     Visit Number 6    Number of Visits 20    Date for PT Re-Evaluation 05/09/24    Authorization Type [employer]    Progress Note Due on Visit 10    PT Start Time 1433    PT Stop Time 1512    PT Time Calculation (min) 39 min    Activity Tolerance Patient tolerated treatment well    Behavior During Therapy WFL for tasks assessed/performed                  Past Medical History:  Diagnosis Date   Arthritis    Right wrist   BPH (benign prostatic hypertrophy)    COPD (chronic obstructive pulmonary disease) (HCC)    emphysema   Frequency of urination    History of lung cancer    STAGE 1A NON-SMALL CELL LUNG CARCINOMA ---  04-10-2009   S/P RIGHT UPPER LOBECTOMY , NO CHEMORADIATION--  NO RECURRENCE  (ONCOLOGIST-- DR Marguerita Shih)   Hyperlipidemia    Hypertension    nscl ca dx'd 04/2009   lung   Pulmonary nodule seen on imaging study    STABLE LEFT LOWER LOBE NODULE  PER CHEST CT--  MONITORED BY DR [facility]   Sleep apnea    OSA does not use CPAP   Type 2 diabetes mellitus (HCC)    Urgency of urination    Past Surgical History:  Procedure Laterality Date   CARDIOVASCULAR STRESS TEST  04-08-2009   NORMAL NUCLEAR STUDY/ NO ISCHEMIA/ EF 60%   CARPAL TUNNEL RELEASE Right    COLONOSCOPY  last 2019   CYST EXCISION Left 10/03/2018   Procedure: EXCISION OF LEFT AXILLARY SEBACEOUS CYST;  Surgeon: Oza Blumenthal, MD;  Location: [facility];  Service: General;  Laterality: Left;   excision of abcess     INTERCOSTAL NERVE BLOCK Left 12/16/2020   Procedure: INTERCOSTAL NERVE BLOCK;  Surgeon: Zelphia Higashi, MD;  Location: [facility] OR;  Service: Thoracic;  Laterality: Left;   KNEE ARTHROSCOPY W/ MENISCECTOMY Left 10-31-2004   LEFT SHOULDER ARTHROSCOPY/ ACROMIOPLASTY/  DECOMPRESSION/ SYNOVECTOMY  11-20-2007   LYMPH NODE DISSECTION Left 12/16/2020   Procedure: LYMPH NODE DISSECTION, left lung;  Surgeon: Zelphia Higashi, MD;  Location: [facility] OR;  Service: Thoracic;  Laterality: Left;   POLYPECTOMY     TRANSURETHRAL RESECTION OF PROSTATE N/A 11/03/2013   Procedure: TRANSURETHRAL RESECTION OF THE PROSTATE WITH GYRUS INSTRUMENTS;  Surgeon: Trent Frizzle, MD;  Location: [facility];  Service: Urology;  Laterality: N/A;   TRIGGER FINGER RELEASE Right 2019   VIDEO ASSISTED THORACOSCOPY (VATS)/ LOBECTOMY Right 04-10-2009  DR Percy Bracken   RIGHT UPPER LOBECTOMY AND NODE DISSECTION   VIDEO BRONCHOSCOPY WITH ENDOBRONCHIAL NAVIGATION N/A 12/02/2020   Procedure: VIDEO BRONCHOSCOPY WITH ENDOBRONCHIAL NAVIGATION;  Surgeon: Zelphia Higashi, MD;  Location: [facility] OR;  Service: Thoracic;  Laterality: N/A;   XI ROBOTIC ASSISTED THORASCOPY-LEFT LOWER LOBECTOMY (Left  12/16/2020   Patient Active Problem List   Diagnosis Date Noted   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) 03/22/2021   S/P lobectomy of lung 12/16/2020   Scapholunate advanced collapse of right wrist 06/21/2017   Trigger finger, left middle finger 06/21/2017   Right hand pain 01/18/2017   Hypertrophy of prostate with urinary obstruction and  other lower urinary tract symptoms (LUTS) 11/03/2013   Cancer of upper lobe of right lung (HCC) 06/27/2012   OBESITY, UNSPECIFIED 04/08/2009   DIZZINESS 04/08/2009   DIABETES MELLITUS, TYPE II, CONTROLLED 04/07/2009   HYPERLIPIDEMIA 04/07/2009   Essential hypertension 04/07/2009    PCP: [name], MD   REFERRING PROVIDER: Cristie Hem, PA-C   REFERRING DIAG:   Diagnosis  M25.551 (ICD-10-CM) - Pain in right hip    THERAPY DIAG:  Right hip pain  Other low back pain  Acute pain of left knee  Muscle weakness (generalized)  Difficulty in walking, not elsewhere classified  Rationale for Evaluation and Treatment:  Rehabilitation  ONSET DATE: Hip and back over 1 month, left knee off and on for a few months  SUBJECTIVE:   SUBJECTIVE STATEMENT: Pt indicated feeling improvement in back and also in knee today vs. Last visit.  Thought massage gun was helpful and feeling like knees are some stronger.   PERTINENT HISTORY: COPD, h/o lung cancer, BPH, arthritis, HTN, DM2, Left knee arthroscopy c meniscectomy, Left shoulder arthroscopy 2008, left lower lobe lobectomy See PMH above   PAIN:  NPRS scale:  3/10 Pain location: Lt knee Pain description: achy, soreness Aggravating factors: walking, standing, bending, driving Relieving factors: changing positions, resting  PRECAUTIONS: Other: no E-stim over lung field due to h/o cancer  WEIGHT BEARING RESTRICTIONS: No  FALLS:  Has patient fallen in last 6 months? No  LIVING ENVIRONMENT: Lives with: lives with their family and lives with their spouse Lives in: House/apartment Stairs: Yes: Internal: 15 steps; on right going up Has following equipment at home:  raised toilet  OCCUPATION: retired  PLOF: Independent  PATIENT GOALS: be able to lift my leg to drive without pain, walk without pain  Next MD visit:   OBJECTIVE:   DIAGNOSTIC FINDINGS: 02/06/24 X-rays demonstrate advanced multilevel degenerative changes worse L5-S1    PATIENT SURVEYS:  Patient-Specific Activity Scoring Scheme  "0" represents "unable to perform." "10" represents "able to perform at prior level. 0 1 2 3 4 5 6 7 8 9  10 (Date and Score)   Activity Eval  02/25/24  03/20/2024  1. Walking 5   9  2. driving 5   9  3. Climbing step 4 8  4.     5.    Score 14/3= 4.7 8.667   Total score = sum of the activity scores/number of activities Minimum detectable change (90%CI) for average score = 2 points Minimum detectable change (90%CI) for single activity score = 3 points  COGNITION: Overall cognitive status: WFL    SENSATION: WFL   POSTURE:  rounded shoulders,  forward head, and decreased lumbar lordosis  PALPATION: TTP: lateral Rt hip and Rt QL  LOWER EXTREMITY ROM:   ROM Right 02/25/24 Supine  active Left 02/25/24 Supine active  Hip flexion 85 82  Hip extension    Hip abduction    Hip adduction    Hip internal rotation    Hip external rotation    Knee flexion 118 114  Knee extension 0 -6  Ankle dorsiflexion    Ankle plantarflexion    Ankle inversion    Ankle eversion     (Blank rows = not tested)  Lumbar ROM:  02/28/2024: Lumbar extension 25 % WFL with pain noted in Rt lumbar/posterior hip.  X 5 in standing produced similar pain each time.    02/25/2024 eval: Lumbar flexion: 60 degrees Lumbar extension: 20 degrees Side Bending to Rt: 28 degrees  Side bending left: 22 degrees  LOWER EXTREMITY MMT:  MMT Right eval Left eval Right 03/13/2024 Left 03/13/2024  Hip flexion 4 4 5/5 5/5  Hip extension      Hip abduction 4 4    Hip adduction 4 4    Hip internal rotation      Hip external rotation      Knee flexion 5 4 5/5 5/5  Knee extension 5 4- 5/5 5/5  Ankle dorsiflexion      Ankle plantarflexion      Ankle inversion      Ankle eversion       (Blank rows = not tested)  LOWER EXTREMITY SPECIAL TESTS:  02/26/2024 Slump test: Rt: negative, Left negative  FUNCTIONAL TESTS:  02/28/2024:  18 inch chair transfer s UE assist with fair control in lowering.   02/25/24: 5 times sit to stand: 16.11 seconds UE support  GAIT:  03/11/2024:  Deviation in stance on Lt leg c maintained knee flexion noted.   02/25/24 Distance walked: clinic distances Assistive device utilized: None Level of assistance: Complete Independence Comments: decreased heel to toe gait pattern, mild forward flexion                                                                                                                                                                        TODAY'S TREATMENT                                                                           DATE: 03/20/2024 Manual: Percussive device STM to Rt lower lumbar, superior glute max, glute med/min on Rt side  Therex: Nustep Lvl 6 UE/LE 10 mins  SKC 15 sec x 3 bilateral  Supine bridge 2-3 sec hold 2 x 10  Incline gastroc stretch 30 sec x 5 bilateral    TherActivity(to improve squatting, transfers, stair navigation) Leg press double leg  87 lbs x 20  , single leg  2 x 15 37 lbs,  performed bilaterally - performed without knee pain and in range available without pain in flexion.  Step up forward 6 inch step with light hand rail assist x 5 each side  TODAY'S TREATMENT  DATE: 03/18/2024 Manual: Percussive device STM to Rt lower lumbar, superior glute max, glute med/min on Rt side  Therex: Supine quad set 5 sec hold x 10 bilateral Supine lumbar trunk rotation stretch 15 sec x 3 bilateral  Seated quad set c SLR x 10 (additional time for cues for activity) Nustep lvl 6 10 min UE/LE Gastroc stretch incline 30 sec x 3   Additional time spent in education of techniques of intervention.   TODAY'S TREATMENT                                                                          DATE: 03/13/2024 Therex: Nustep Lvl 6 UE/LE 10 mins  Incline gastroc stretch 30 sec x 3 bilateral  Verbal review of at home leg strengthening activity  TherActivity(to improve squatting, transfers, stair navigation) Leg press double leg  87 lbs x 15  , single leg  2 x 15 37 lbs,  performed bilaterally - performed without knee pain and in range available without pain in flexion.   Neuro Re-ed: (to improve neuro muscular recruitment, balance improvements for WB control) Tandem stance 1 min x 2 bilateral with occasional HHA on bar - time spent in instruction Standing Lt leg quad set TKE muscle recruitment green band 5 sec hold 2 x 10   TODAY'S TREATMENT                                                                           DATE: 03/11/2024 Therex: Supine lumbar trunk rotation stretch 15 sec x 3 bilateral Supine bridge with green band hip abduction hold in movement 2 x 10 2-3 sec hold  Supine hooklying hip abduction clam shell green band with contralateral leg isometric hold 2 x 20 bilateral  Nustep Lvl 6 UE/LE 8 mins  Incline gastroc stretch 30 sec x 3 bilateral  Verbal review of at home leg strengthening activity  TherActivity(to improve squatting, transfers, stair navigation) Leg press double leg  81 lbs x 15  , single leg  2 x 15 37 lbs,  performed bilaterally - performed without knee pain and in range available without pain in flexion.   Neuro Re-ed: (to improve neuro muscular recruitment, balance improvements for WB control) Tandem stance 1 min x 1 bilateral with occasional HHA on bar - time spent in instruction  TODAY'S TREATMENT                                                                          DATE: 02/28/2024 Therex: Supine lumbar trunk rotation stretch 15 sec x 3 bilateral Supine bridge with green band hip abduction hold in movement 2 x 10 2-3 sec hold  Supine  hooklying hip abduction clam shell green band with contralateral leg isometric hold x 20 bilateral  Supine SLR x 10 bilateral   Nustep Lvl 5 UE/LE 8 mins  Sit to stand to sit no UE assist x 10 from 22 inch table height  Review of existing HEP c verbal cues for techniques.   Neuro Re-ed: (to improve neuro muscular recruitment) Supine quad set 5 sec hold x 10 bilateral  Seated quad set instruction for daily use in chair if desired.  Performed 5 sec hold 2-3 as demo in clinic.   PATIENT EDUCATION:  Education details: HEP, POC Person educated: Patient Education method: Programmer, multimedia, Demonstration, Verbal cues, and Handouts Education comprehension: verbalized understanding, returned demonstration, and verbal cues required  HOME EXERCISE PROGRAM: Access Code: [email or web address] URL: [email or web address] Date:  02/25/2024 Prepared by: Narda Amber  Exercises - Supine Bridge  - 2 x daily - 7 x weekly - 3 sets - 10 reps - 5 seconds hold - Supine Lower Trunk Rotation  - 2 x daily - 7 x weekly - 3 sets - 10 reps - Long Sitting Quad Set with Towel Roll Under Heel  - 2 x daily - 7 x weekly - 10 reps - 5 seconds hold - Supine Active Straight Leg Raise  - 2 x daily - 7 x weekly - 10 reps  ASSESSMENT:  CLINICAL IMPRESSION: Improved symptoms reported from knee in last few days.  Patient specific functional scale assessment showed noted improvement compared to evaluation.  Continued strengthening to help improve functional stair and other daily activity.   OBJECTIVE IMPAIRMENTS: decreased balance, difficulty walking, decreased ROM, decreased strength, and pain.   ACTIVITY LIMITATIONS: lifting, bending, sitting, standing, squatting, stairs, and transfers  PARTICIPATION LIMITATIONS: cleaning, laundry, driving, and community activity  PERSONAL FACTORS: 3+ comorbidities: see PMH above  are also affecting patient's functional outcome.   REHAB POTENTIAL: Good  CLINICAL DECISION MAKING: Stable/uncomplicated  EVALUATION COMPLEXITY: Moderate   GOALS: Goals reviewed with patient? Yes  SHORT TERM GOALS: (target date for Short term goals are 3 weeks 03/17/2024)   1.  Patient will demonstrate independent use of home exercise program to maintain progress from in clinic treatments.  Goal status: Met  LONG TERM GOALS: (target dates for all long term goals are 10 weeks  05/09/2024)   1. Patient will demonstrate/report pain at worst less than or equal to 2/10 to facilitate minimal limitation in daily activity secondary to pain symptoms.  Goal status: on going 03/20/2024   2. Patient will demonstrate independent use of home exercise program to facilitate ability to maintain/progress functional gains from skilled physical therapy services.  Goal status: on going 03/20/2024   3. Patient will demonstrate  Patient specific functional scale avg > or = 6.7 to indicate reduced disability due to condition.   Goal status: met  03/20/2024   4.  Patient will demonstrate bil  LE MMT >/= 4+/5 throughout to faciltiate usual transfers, stairs, squatting at [facility] for daily life.   Goal status: on going 03/20/2024   5.  Patient will demonstrate reciprocal gait pattern up and down 1 flight stairs with single hand rail  Goal status: on going 03/20/2024   6.  Pt will report being able to walk 1/4 a mile in </= 20 minutes with pain </= 3/10.  Goal status: on going 03/20/2024   7.  Pt will perform 5 time sit to stand in </= 13 seconds with no UE support.  Goal Status: on going 03/20/2024   PLAN:  PT FREQUENCY: 1-2x/week  PT DURATION: 10 weeks  PLANNED INTERVENTIONS: Can include 16109- PT Re-evaluation, 97110-Therapeutic exercises, 97530- Therapeutic activity, 97112- Neuromuscular re-education, 97535- Self Care, 97140- Manual therapy, [phone]- Gait training, [phone]- Orthotic Fit/training, [phone]- Canalith repositioning, [id number]- Aquatic Therapy, [phone]- Electrical stimulation (unattended), [phone]- Electrical stimulation (manual), [id number] Physical performance testing, 97016- Vasopneumatic device, [id number]- Ultrasound, [id number]- Traction (mechanical), [id number]- Ionotophoresis 4mg /ml Dexamethasone, Patient/Family education, Balance training, Stair training, Taping, Dry Needling, Joint mobilization, Joint manipulation, Spinal manipulation, Spinal mobilization, Scar mobilization, Vestibular training, Visual/preceptual remediation/compensation, DME instructions, Cryotherapy, and Moist heat.  All performed as medically necessary.  All included unless contraindicated  PLAN FOR NEXT SESSION:  Review and progress HEP to include newer exercises.    Bonna Bustard, PT, DPT, OCS, ATC 03/20/24  3:11 PM

## 2024-03-24 ENCOUNTER — Encounter: Payer: Self-pay | Admitting: Physical Therapy

## 2024-03-24 ENCOUNTER — Ambulatory Visit (INDEPENDENT_AMBULATORY_CARE_PROVIDER_SITE_OTHER): Admitting: Physical Therapy

## 2024-03-24 DIAGNOSIS — M25562 Pain in left knee: Secondary | ICD-10-CM

## 2024-03-24 DIAGNOSIS — M5459 Other low back pain: Secondary | ICD-10-CM | POA: Diagnosis not present

## 2024-03-24 DIAGNOSIS — M6281 Muscle weakness (generalized): Secondary | ICD-10-CM | POA: Diagnosis not present

## 2024-03-24 DIAGNOSIS — R262 Difficulty in walking, not elsewhere classified: Secondary | ICD-10-CM

## 2024-03-24 DIAGNOSIS — M25551 Pain in right hip: Secondary | ICD-10-CM | POA: Diagnosis not present

## 2024-03-24 NOTE — Therapy (Signed)
 OUTPATIENT PHYSICAL THERAPY TREATMENT   Patient Name: Bobby [name] MRN: [id number] DOB:09-14-1946, 78 y.o., male Today's Date: 03/24/2024  END OF SESSION:  PT End of Session - 03/24/24 1443     Visit Number 7    Number of Visits 20    Date for PT Re-Evaluation 05/09/24    Authorization Type [employer]    Progress Note Due on Visit 10    PT Start Time 1436    PT Stop Time 1515    PT Time Calculation (min) 39 min    Activity Tolerance Patient tolerated treatment well    Behavior During Therapy WFL for tasks assessed/performed                   Past Medical History:  Diagnosis Date   Arthritis    Right wrist   BPH (benign prostatic hypertrophy)    COPD (chronic obstructive pulmonary disease) (HCC)    emphysema   Frequency of urination    History of lung cancer    STAGE 1A NON-SMALL CELL LUNG CARCINOMA ---  04-10-2009   S/P RIGHT UPPER LOBECTOMY , NO CHEMORADIATION--  NO RECURRENCE  (ONCOLOGIST-- DR Marguerita Shih)   Hyperlipidemia    Hypertension    nscl ca dx'd 04/2009   lung   Pulmonary nodule seen on imaging study    STABLE LEFT LOWER LOBE NODULE  PER CHEST CT--  MONITORED BY DR [facility]   Sleep apnea    OSA does not use CPAP   Type 2 diabetes mellitus (HCC)    Urgency of urination    Past Surgical History:  Procedure Laterality Date   CARDIOVASCULAR STRESS TEST  04-08-2009   NORMAL NUCLEAR STUDY/ NO ISCHEMIA/ EF 60%   CARPAL TUNNEL RELEASE Right    COLONOSCOPY  last 2019   CYST EXCISION Left 10/03/2018   Procedure: EXCISION OF LEFT AXILLARY SEBACEOUS CYST;  Surgeon: Oza Blumenthal, MD;  Location: [facility];  Service: General;  Laterality: Left;   excision of abcess     INTERCOSTAL NERVE BLOCK Left 12/16/2020   Procedure: INTERCOSTAL NERVE BLOCK;  Surgeon: Zelphia Higashi, MD;  Location: [facility] OR;  Service: Thoracic;  Laterality: Left;   KNEE ARTHROSCOPY W/ MENISCECTOMY Left 10-31-2004   LEFT SHOULDER ARTHROSCOPY/ ACROMIOPLASTY/  DECOMPRESSION/ SYNOVECTOMY  11-20-2007   LYMPH NODE DISSECTION Left 12/16/2020   Procedure: LYMPH NODE DISSECTION, left lung;  Surgeon: Zelphia Higashi, MD;  Location: [facility] OR;  Service: Thoracic;  Laterality: Left;   POLYPECTOMY     TRANSURETHRAL RESECTION OF PROSTATE N/A 11/03/2013   Procedure: TRANSURETHRAL RESECTION OF THE PROSTATE WITH GYRUS INSTRUMENTS;  Surgeon: Trent Frizzle, MD;  Location: [facility];  Service: Urology;  Laterality: N/A;   TRIGGER FINGER RELEASE Right 2019   VIDEO ASSISTED THORACOSCOPY (VATS)/ LOBECTOMY Right 04-10-2009  DR Percy Bracken   RIGHT UPPER LOBECTOMY AND NODE DISSECTION   VIDEO BRONCHOSCOPY WITH ENDOBRONCHIAL NAVIGATION N/A 12/02/2020   Procedure: VIDEO BRONCHOSCOPY WITH ENDOBRONCHIAL NAVIGATION;  Surgeon: Zelphia Higashi, MD;  Location: [facility] OR;  Service: Thoracic;  Laterality: N/A;   XI ROBOTIC ASSISTED THORASCOPY-LEFT LOWER LOBECTOMY (Left  12/16/2020   Patient Active Problem List   Diagnosis Date Noted   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) 03/22/2021   S/P lobectomy of lung 12/16/2020   Scapholunate advanced collapse of right wrist 06/21/2017   Trigger finger, left middle finger 06/21/2017   Right hand pain 01/18/2017   Hypertrophy of prostate with urinary obstruction  and other lower urinary tract symptoms (LUTS) 11/03/2013   Cancer of upper lobe of right lung (HCC) 06/27/2012   OBESITY, UNSPECIFIED 04/08/2009   DIZZINESS 04/08/2009   DIABETES MELLITUS, TYPE II, CONTROLLED 04/07/2009   HYPERLIPIDEMIA 04/07/2009   Essential hypertension 04/07/2009    PCP: Merl Star, MD   REFERRING PROVIDER: Sandie Cross, PA-C   REFERRING DIAG:   Diagnosis  M25.551 (ICD-10-CM) - Pain in right hip    THERAPY DIAG:  Right hip pain  Other low back pain  Acute pain of left knee  Muscle weakness (generalized)  Difficulty in walking, not elsewhere classified  Rationale for Evaluation and Treatment:  Rehabilitation  ONSET DATE: Hip and back over 1 month, left knee off and on for a few months  SUBJECTIVE:   SUBJECTIVE STATEMENT: Pt stating he feels pretty good even after mowing his yard this weekend.   PERTINENT HISTORY: COPD, h/o lung cancer, BPH, arthritis, HTN, DM2, Left knee arthroscopy c meniscectomy, Left shoulder arthroscopy 2008, left lower lobe lobectomy See PMH above   PAIN:  NPRS scale:  2/10 Pain location: Lt knee Pain description: achy, soreness Aggravating factors: walking, standing, bending, driving Relieving factors: changing positions, resting  PRECAUTIONS: Other: no E-stim over lung field due to h/o cancer  WEIGHT BEARING RESTRICTIONS: No  FALLS:  Has patient fallen in last 6 months? No  LIVING ENVIRONMENT: Lives with: lives with their family and lives with their spouse Lives in: House/apartment Stairs: Yes: Internal: 15 steps; on right going up Has following equipment at home:  raised toilet  OCCUPATION: retired  PLOF: Independent  PATIENT GOALS: be able to lift my leg to drive without pain, walk without pain  Next MD visit:   OBJECTIVE:   DIAGNOSTIC FINDINGS: 02/06/24 X-rays demonstrate advanced multilevel degenerative changes worse L5-S1    PATIENT SURVEYS:  Patient-Specific Activity Scoring Scheme  "0" represents "unable to perform." "10" represents "able to perform at prior level. 0 1 2 3 4 5 6 7 8 9  10 (Date and Score)   Activity Eval  02/25/24  03/20/2024  1. Walking 5   9  2. driving 5   9  3. Climbing step 4 8  4.     5.    Score 14/3= 4.7 8.667   Total score = sum of the activity scores/number of activities Minimum detectable change (90%CI) for average score = 2 points Minimum detectable change (90%CI) for single activity score = 3 points  COGNITION: Overall cognitive status: WFL    SENSATION: WFL   POSTURE:  rounded shoulders, forward head, and decreased lumbar lordosis  PALPATION: TTP: lateral Rt hip and Rt  QL  LOWER EXTREMITY ROM:   ROM Right 02/25/24 Supine  active Left 02/25/24 Supine active Rt / Left 03/24/24 Supine active  Hip flexion 85 82 90 / 88  Hip extension     Hip abduction     Hip adduction     Hip internal rotation     Hip external rotation     Knee flexion 118 114   Knee extension 0 -6   Ankle dorsiflexion     Ankle plantarflexion     Ankle inversion     Ankle eversion      (Blank rows = not tested)  Lumbar ROM:  02/28/2024: Lumbar extension 25 % WFL with pain noted in Rt lumbar/posterior hip.  X 5 in standing produced similar pain each time.    02/25/2024 eval: Lumbar flexion: 60 degrees Lumbar extension: 20  degrees Side Bending to Rt: 28 degrees Side bending left: 22 degrees  LOWER EXTREMITY MMT:  MMT Right eval Left eval Right 03/13/2024 Left 03/13/2024  Hip flexion 4 4 5/5 5/5  Hip extension      Hip abduction 4 4    Hip adduction 4 4    Hip internal rotation      Hip external rotation      Knee flexion 5 4 5/5 5/5  Knee extension 5 4- 5/5 5/5  Ankle dorsiflexion      Ankle plantarflexion      Ankle inversion      Ankle eversion       (Blank rows = not tested)  LOWER EXTREMITY SPECIAL TESTS:  02/26/2024 Slump test: Rt: negative, Left negative  FUNCTIONAL TESTS:  02/28/2024:  18 inch chair transfer s UE assist with fair control in lowering.   02/25/24: 5 times sit to stand: 16.11 seconds UE support  GAIT:  03/11/2024:  Deviation in stance on Lt leg c maintained knee flexion noted.   02/25/24 Distance walked: clinic distances Assistive device utilized: None Level of assistance: Complete Independence Comments: decreased heel to toe gait pattern, mild forward flexion                                                                                                                                                                        TODAY'S TREATMENT                                                                          DATE:  03/24/2024 Therex: Nustep Lvl 6 UE/LE 10 mins  Incline gastroc stretch 30 sec x 5 bilateral  Standing calf rasies  c UE support Seated hamstring stretch; x 2 holding 30 sec Seated SLR: x 10 bil LE  TherActivity(to improve squatting, transfers, stair navigation) Leg press double leg  100 lbs x 20  , single leg  x 15  43 lbs,  performed bilaterally - performed in pain free ROM Step up forward 6 inch step with single UE support x 15   Manual: Percussive device STM to Rt lower lumbar, superior glute max, glute med/min on Rt side     TODAY'S TREATMENT  DATE: 03/20/2024 Manual: Percussive device STM to Rt lower lumbar, superior glute max, glute med/min on Rt side  Therex: Nustep Lvl 6 UE/LE 10 mins  SKC 15 sec x 3 bilateral  Supine bridge 2-3 sec hold 2 x 10  Incline gastroc stretch 30 sec x 5 bilateral    TherActivity(to improve squatting, transfers, stair navigation) Leg press double leg  87 lbs x 20  , single leg  2 x 15 37 lbs,  performed bilaterally - performed without knee pain and in range available without pain in flexion.  Step up forward 6 inch step with light hand rail assist x 5 each side  TODAY'S TREATMENT                                                                          DATE: 03/18/2024 Manual: Percussive device STM to Rt lower lumbar, superior glute max, glute med/min on Rt side  Therex: Supine quad set 5 sec hold x 10 bilateral Supine lumbar trunk rotation stretch 15 sec x 3 bilateral  Seated quad set c SLR x 10 (additional time for cues for activity) Nustep lvl 6 10 min UE/LE Gastroc stretch incline 30 sec x 3   Additional time spent in education of techniques of intervention.   TODAY'S TREATMENT                                                                          DATE: 03/13/2024 Therex: Nustep Lvl 6 UE/LE 10 mins  Incline gastroc stretch 30 sec x 3 bilateral  Verbal review of  at home leg strengthening activity  TherActivity(to improve squatting, transfers, stair navigation) Leg press double leg  87 lbs x 15  , single leg  2 x 15 37 lbs,  performed bilaterally - performed without knee pain and in range available without pain in flexion.   Neuro Re-ed: (to improve neuro muscular recruitment, balance improvements for WB control) Tandem stance 1 min x 2 bilateral with occasional HHA on bar - time spent in instruction Standing Lt leg quad set TKE muscle recruitment green band 5 sec hold 2 x 10      PATIENT EDUCATION:  Education details: HEP, POC Person educated: Patient Education method: Programmer, multimedia, Demonstration, Verbal cues, and Handouts Education comprehension: verbalized understanding, returned demonstration, and verbal cues required  HOME EXERCISE PROGRAM: Access Code: [email or web address] URL: [email or web address] Date: 03/24/2024 Prepared by: Jerrel Mor  Exercises - Supine Bridge  - 2 x daily - 7 x weekly - 3 sets - 10 reps - 5 seconds hold - Supine Lower Trunk Rotation  - 2 x daily - 7 x weekly - 3 sets - 10 reps - Long Sitting Quad Set with Towel Roll Under Heel  - 2 x daily - 7 x weekly - 10 reps - 5 seconds hold - Heel Raises with Counter Support  - 2 x daily - 7 x weekly - 3 sets - 10 reps - Standing Gastroc  Stretch  - 2 x daily - 7 x weekly - 3 sets - 10 reps - Seated Small Alternating Straight Leg Lifts with Heel Touch  - 2 x daily - 7 x weekly - 2 sets - 10 reps  ASSESSMENT:  CLINICAL IMPRESSION: Pt reporting feeling better since starting therapy. Pt reporting 2/10 worse pain over the last 2 days even after mowing his yard. Pt's hip active ROM has improved since his initial evaluation. Pt tolerating all exercises well. Pt's HEP was updated. Recommend continued skilled PT.      OBJECTIVE IMPAIRMENTS: decreased balance, difficulty walking, decreased ROM, decreased strength, and pain.   ACTIVITY LIMITATIONS: lifting, bending,  sitting, standing, squatting, stairs, and transfers  PARTICIPATION LIMITATIONS: cleaning, laundry, driving, and community activity  PERSONAL FACTORS: 3+ comorbidities: see PMH above  are also affecting patient's functional outcome.   REHAB POTENTIAL: Good  CLINICAL DECISION MAKING: Stable/uncomplicated  EVALUATION COMPLEXITY: Moderate   GOALS: Goals reviewed with patient? Yes  SHORT TERM GOALS: (target date for Short term goals are 3 weeks 03/17/2024)   1.  Patient will demonstrate independent use of home exercise program to maintain progress from in clinic treatments.  Goal status: Met  LONG TERM GOALS: (target dates for all long term goals are 10 weeks  05/09/2024)   1. Patient will demonstrate/report pain at worst less than or equal to 2/10 to facilitate minimal limitation in daily activity secondary to pain symptoms.  Goal status: on going 03/20/2024   2. Patient will demonstrate independent use of home exercise program to facilitate ability to maintain/progress functional gains from skilled physical therapy services.  Goal status: on going 03/20/2024   3. Patient will demonstrate Patient specific functional scale avg > or = 6.7 to indicate reduced disability due to condition.   Goal status: met  03/20/2024   4.  Patient will demonstrate bil  LE MMT >/= 4+/5 throughout to faciltiate usual transfers, stairs, squatting at [facility] for daily life.   Goal status: on going 03/20/2024   5.  Patient will demonstrate reciprocal gait pattern up and down 1 flight stairs with single hand rail  Goal status: on going 03/20/2024   6.  Pt will report being able to walk 1/4 a mile in </= 20 minutes with pain </= 3/10.  Goal status: on going 03/20/2024   7.  Pt will perform 5 time sit to stand in </= 13 seconds with no UE support.  Goal Status: on going 03/20/2024   PLAN:  PT FREQUENCY: 1-2x/week  PT DURATION: 10 weeks  PLANNED INTERVENTIONS: Can include 16109- PT Re-evaluation,  97110-Therapeutic exercises, 97530- Therapeutic activity, 97112- Neuromuscular re-education, 97535- Self Care, 97140- Manual therapy, [phone]- Gait training, [phone]- Orthotic Fit/training, [phone]- Canalith repositioning, [id number]- Aquatic Therapy, [phone]- Electrical stimulation (unattended), [phone]- Electrical stimulation (manual), [id number] Physical performance testing, 97016- Vasopneumatic device, [id number]- Ultrasound, [id number]- Traction (mechanical), [id number]- Ionotophoresis 4mg /ml Dexamethasone , Patient/Family education, Balance training, Stair training, Taping, Dry Needling, Joint mobilization, Joint manipulation, Spinal manipulation, Spinal mobilization, Scar mobilization, Vestibular training, Visual/preceptual remediation/compensation, DME instructions, Cryotherapy, and Moist heat.  All performed as medically necessary.  All included unless contraindicated  PLAN FOR NEXT SESSION:  progress LE and core strengthening, percussor as needed   Jerrel Mor, PT, MPT 03/24/24 3:19 PM   03/24/24  3:19 PM

## 2024-03-27 ENCOUNTER — Encounter: Payer: Self-pay | Admitting: Rehabilitative and Restorative Service Providers"

## 2024-03-27 ENCOUNTER — Ambulatory Visit (INDEPENDENT_AMBULATORY_CARE_PROVIDER_SITE_OTHER): Admitting: Rehabilitative and Restorative Service Providers"

## 2024-03-27 DIAGNOSIS — M5459 Other low back pain: Secondary | ICD-10-CM | POA: Diagnosis not present

## 2024-03-27 DIAGNOSIS — M25562 Pain in left knee: Secondary | ICD-10-CM | POA: Diagnosis not present

## 2024-03-27 DIAGNOSIS — M6281 Muscle weakness (generalized): Secondary | ICD-10-CM

## 2024-03-27 DIAGNOSIS — M25551 Pain in right hip: Secondary | ICD-10-CM

## 2024-03-27 DIAGNOSIS — R262 Difficulty in walking, not elsewhere classified: Secondary | ICD-10-CM

## 2024-03-27 NOTE — Therapy (Signed)
 OUTPATIENT PHYSICAL THERAPY TREATMENT   Patient Name: Bobby [name] MRN: [id number] DOB:04/06/1946, 78 y.o., male Today's Date: 03/27/2024  END OF SESSION:  PT End of Session - 03/27/24 1437     Visit Number 8    Number of Visits 20    Date for PT Re-Evaluation 05/09/24    Authorization Type [employer]    Progress Note Due on Visit 10    PT Start Time 1430    PT Stop Time 1509    PT Time Calculation (min) 39 min    Activity Tolerance Patient tolerated treatment well    Behavior During Therapy WFL for tasks assessed/performed                    Past Medical History:  Diagnosis Date   Arthritis    Right wrist   BPH (benign prostatic hypertrophy)    COPD (chronic obstructive pulmonary disease) (HCC)    emphysema   Frequency of urination    History of lung cancer    STAGE 1A NON-SMALL CELL LUNG CARCINOMA ---  04-10-2009   S/P RIGHT UPPER LOBECTOMY , NO CHEMORADIATION--  NO RECURRENCE  (ONCOLOGIST-- DR Marguerita Shih)   Hyperlipidemia    Hypertension    nscl ca dx'd 04/2009   lung   Pulmonary nodule seen on imaging study    STABLE LEFT LOWER LOBE NODULE  PER CHEST CT--  MONITORED BY DR [facility]   Sleep apnea    OSA does not use CPAP   Type 2 diabetes mellitus (HCC)    Urgency of urination    Past Surgical History:  Procedure Laterality Date   CARDIOVASCULAR STRESS TEST  04-08-2009   NORMAL NUCLEAR STUDY/ NO ISCHEMIA/ EF 60%   CARPAL TUNNEL RELEASE Right    COLONOSCOPY  last 2019   CYST EXCISION Left 10/03/2018   Procedure: EXCISION OF LEFT AXILLARY SEBACEOUS CYST;  Surgeon: Oza Blumenthal, MD;  Location: [facility];  Service: General;  Laterality: Left;   excision of abcess     INTERCOSTAL NERVE BLOCK Left 12/16/2020   Procedure: INTERCOSTAL NERVE BLOCK;  Surgeon: Zelphia Higashi, MD;  Location: [facility] OR;  Service: Thoracic;  Laterality: Left;   KNEE ARTHROSCOPY W/ MENISCECTOMY Left 10-31-2004   LEFT SHOULDER ARTHROSCOPY/ ACROMIOPLASTY/  DECOMPRESSION/ SYNOVECTOMY  11-20-2007   LYMPH NODE DISSECTION Left 12/16/2020   Procedure: LYMPH NODE DISSECTION, left lung;  Surgeon: Zelphia Higashi, MD;  Location: [facility] OR;  Service: Thoracic;  Laterality: Left;   POLYPECTOMY     TRANSURETHRAL RESECTION OF PROSTATE N/A 11/03/2013   Procedure: TRANSURETHRAL RESECTION OF THE PROSTATE WITH GYRUS INSTRUMENTS;  Surgeon: Trent Frizzle, MD;  Location: [facility];  Service: Urology;  Laterality: N/A;   TRIGGER FINGER RELEASE Right 2019   VIDEO ASSISTED THORACOSCOPY (VATS)/ LOBECTOMY Right 04-10-2009  DR Percy Bracken   RIGHT UPPER LOBECTOMY AND NODE DISSECTION   VIDEO BRONCHOSCOPY WITH ENDOBRONCHIAL NAVIGATION N/A 12/02/2020   Procedure: VIDEO BRONCHOSCOPY WITH ENDOBRONCHIAL NAVIGATION;  Surgeon: Zelphia Higashi, MD;  Location: [facility] OR;  Service: Thoracic;  Laterality: N/A;   XI ROBOTIC ASSISTED THORASCOPY-LEFT LOWER LOBECTOMY (Left  12/16/2020   Patient Active Problem List   Diagnosis Date Noted   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) 03/22/2021   S/P lobectomy of lung 12/16/2020   Scapholunate advanced collapse of right wrist 06/21/2017   Trigger finger, left middle finger 06/21/2017   Right hand pain 01/18/2017   Hypertrophy of prostate with urinary  obstruction and other lower urinary tract symptoms (LUTS) 11/03/2013   Cancer of upper lobe of right lung (HCC) 06/27/2012   OBESITY, UNSPECIFIED 04/08/2009   DIZZINESS 04/08/2009   DIABETES MELLITUS, TYPE II, CONTROLLED 04/07/2009   HYPERLIPIDEMIA 04/07/2009   Essential hypertension 04/07/2009    PCP: Merl Star, MD   REFERRING PROVIDER: Sandie Cross, PA-C   REFERRING DIAG:   Diagnosis  M25.551 (ICD-10-CM) - Pain in right hip    THERAPY DIAG:  Right hip pain  Other low back pain  Acute pain of left knee  Muscle weakness (generalized)  Difficulty in walking, not elsewhere classified  Rationale for Evaluation and Treatment:  Rehabilitation  ONSET DATE: Hip and back over 1 month, left knee off and on for a few months  SUBJECTIVE:   SUBJECTIVE STATEMENT: Pt indicated having some complaints in knee in last few days.  Reported some limping.  Pt indicated back and hip seemed to be feeling pretty good.   PERTINENT HISTORY: COPD, h/o lung cancer, BPH, arthritis, HTN, DM2, Left knee arthroscopy c meniscectomy, Left shoulder arthroscopy 2008, left lower lobe lobectomy See PMH above   PAIN:  NPRS scale:  up to 5/10 Pain location: Lt knee Pain description: achy, soreness Aggravating factors: walking, standing, bending, driving Relieving factors: changing positions, resting  PRECAUTIONS: Other: no E-stim over lung field due to h/o cancer  WEIGHT BEARING RESTRICTIONS: No  FALLS:  Has patient fallen in last 6 months? No  LIVING ENVIRONMENT: Lives with: lives with their family and lives with their spouse Lives in: House/apartment Stairs: Yes: Internal: 15 steps; on right going up Has following equipment at home:  raised toilet  OCCUPATION: retired  PLOF: Independent  PATIENT GOALS: be able to lift my leg to drive without pain, walk without pain  Next MD visit:   OBJECTIVE:   DIAGNOSTIC FINDINGS: 02/06/24 X-rays demonstrate advanced multilevel degenerative changes worse L5-S1    PATIENT SURVEYS:  Patient-Specific Activity Scoring Scheme  "0" represents "unable to perform." "10" represents "able to perform at prior level. 0 1 2 3 4 5 6 7 8 9  10 (Date and Score)   Activity Eval  02/25/24  03/20/2024  1. Walking 5   9  2. driving 5   9  3. Climbing step 4 8  4.     5.    Score 14/3= 4.7 8.667   Total score = sum of the activity scores/number of activities Minimum detectable change (90%CI) for average score = 2 points Minimum detectable change (90%CI) for single activity score = 3 points  COGNITION: Overall cognitive status: WFL    SENSATION: WFL   POSTURE:  rounded shoulders, forward  head, and decreased lumbar lordosis  PALPATION: TTP: lateral Rt hip and Rt QL  LOWER EXTREMITY ROM:   ROM Right 02/25/24 Supine  active Left 02/25/24 Supine active Rt / Left 03/24/24 Supine active  Hip flexion 85 82 90 / 88  Hip extension     Hip abduction     Hip adduction     Hip internal rotation     Hip external rotation     Knee flexion 118 114   Knee extension 0 -6   Ankle dorsiflexion     Ankle plantarflexion     Ankle inversion     Ankle eversion      (Blank rows = not tested)  Lumbar ROM:  02/28/2024: Lumbar extension 25 % WFL with pain noted in Rt lumbar/posterior hip.  X 5 in standing  produced similar pain each time.    02/25/2024 eval: Lumbar flexion: 60 degrees Lumbar extension: 20 degrees Side Bending to Rt: 28 degrees Side bending left: 22 degrees  LOWER EXTREMITY MMT:  MMT Right eval Left eval Right 03/13/2024 Left 03/13/2024  Hip flexion 4 4 5/5 5/5  Hip extension      Hip abduction 4 4    Hip adduction 4 4    Hip internal rotation      Hip external rotation      Knee flexion 5 4 5/5 5/5  Knee extension 5 4- 5/5 5/5  Ankle dorsiflexion      Ankle plantarflexion      Ankle inversion      Ankle eversion       (Blank rows = not tested)  LOWER EXTREMITY SPECIAL TESTS:  02/26/2024 Slump test: Rt: negative, Left negative  FUNCTIONAL TESTS:  03/27/2024: 5 x sit to stand: no UE 12.97 seconds  02/28/2024:  18 inch chair transfer s UE assist with fair control in lowering.   02/25/24: 5 times sit to stand: 16.11 seconds UE support  GAIT:  03/11/2024:  Deviation in stance on Lt leg c maintained knee flexion noted.   02/25/24 Distance walked: clinic distances Assistive device utilized: None Level of assistance: Complete Independence Comments: decreased heel to toe gait pattern, mild forward flexion                                                                                                                                                                         TODAY'S TREATMENT                                                                          DATE: 03/27/2024 Therex: Nustep Lvl 6 UE/LE 12 mins  Incline gastroc stretch 30 sec x 3 bilateral   Neuro Re-ed (balance control, neuromuscular coordination/recruitment)  SLS with 3 cones anteriorly semicircle touching with contralateral leg x 8 each, performed bilaterally with occasional HHA and SBA to min A at times to prevent loss of balance.  Tandem stance 1 min x 2 bilateral with occasional to moderate HHA on bar with SBA to min A at times to prevent loss of balance.    TherActivity(to improve squatting, transfers, stair navigation) Leg press double leg  87 lbs x 20  , single leg  2 x 15 37 lbs,  performed bilaterally - performed without knee pain and in range available without  pain in flexion.  5 x sit to stand from 18 inch chair no UE  TODAY'S TREATMENT                                                                          DATE: 03/24/2024 Therex: Nustep Lvl 6 UE/LE 10 mins  Incline gastroc stretch 30 sec x 5 bilateral  Standing calf rasies  c UE support Seated hamstring stretch; x 2 holding 30 sec Seated SLR: x 10 bil LE  TherActivity(to improve squatting, transfers, stair navigation) Leg press double leg  100 lbs x 20  , single leg  x 15  43 lbs,  performed bilaterally - performed in pain free ROM Step up forward 6 inch step with single UE support x 15   Manual: Percussive device STM to Rt lower lumbar, superior glute max, glute med/min on Rt side   TODAY'S TREATMENT                                                                          DATE: 03/20/2024 Manual: Percussive device STM to Rt lower lumbar, superior glute max, glute med/min on Rt side  Therex: Nustep Lvl 6 UE/LE 10 mins  SKC 15 sec x 3 bilateral  Supine bridge 2-3 sec hold 2 x 10  Incline gastroc stretch 30 sec x 5 bilateral    TherActivity(to improve squatting, transfers, stair  navigation) Leg press double leg  87 lbs x 20  , single leg  2 x 15 37 lbs,  performed bilaterally - performed without knee pain and in range available without pain in flexion.  Step up forward 6 inch step with light hand rail assist x 5 each side  TODAY'S TREATMENT                                                                          DATE: 03/18/2024 Manual: Percussive device STM to Rt lower lumbar, superior glute max, glute med/min on Rt side  Therex: Supine quad set 5 sec hold x 10 bilateral Supine lumbar trunk rotation stretch 15 sec x 3 bilateral  Seated quad set c SLR x 10 (additional time for cues for activity) Nustep lvl 6 10 min UE/LE Gastroc stretch incline 30 sec x 3   Additional time spent in education of techniques of intervention.   TODAY'S TREATMENT  DATE: 03/13/2024 Therex: Nustep Lvl 6 UE/LE 10 mins  Incline gastroc stretch 30 sec x 3 bilateral  Verbal review of at home leg strengthening activity  TherActivity(to improve squatting, transfers, stair navigation) Leg press double leg  87 lbs x 15  , single leg  2 x 15 37 lbs,  performed bilaterally - performed without knee pain and in range available without pain in flexion.   Neuro Re-ed: (to improve neuro muscular recruitment, balance improvements for WB control) Tandem stance 1 min x 2 bilateral with occasional HHA on bar - time spent in instruction Standing Lt leg quad set TKE muscle recruitment green band 5 sec hold 2 x 10      PATIENT EDUCATION:  Education details: HEP, POC Person educated: Patient Education method: Programmer, multimedia, Demonstration, Verbal cues, and Handouts Education comprehension: verbalized understanding, returned demonstration, and verbal cues required  HOME EXERCISE PROGRAM: Access Code: [email or web address] URL: [email or web address] Date: 03/24/2024 Prepared by: Jerrel Mor  Exercises - Supine Bridge  - 2  x daily - 7 x weekly - 3 sets - 10 reps - 5 seconds hold - Supine Lower Trunk Rotation  - 2 x daily - 7 x weekly - 3 sets - 10 reps - Long Sitting Quad Set with Towel Roll Under Heel  - 2 x daily - 7 x weekly - 10 reps - 5 seconds hold - Heel Raises with Counter Support  - 2 x daily - 7 x weekly - 3 sets - 10 reps - Standing Gastroc Stretch  - 2 x daily - 7 x weekly - 3 sets - 10 reps - Seated Small Alternating Straight Leg Lifts with Heel Touch  - 2 x daily - 7 x weekly - 2 sets - 10 reps  ASSESSMENT:  CLINICAL IMPRESSION: 5 x sit to stand timing improved today.  Pt indicated reduced complaints in back and hip have continued to be better.  Lt knee pain off and on noted but generally better.  Continued skilled PT services indicated.     OBJECTIVE IMPAIRMENTS: decreased balance, difficulty walking, decreased ROM, decreased strength, and pain.   ACTIVITY LIMITATIONS: lifting, bending, sitting, standing, squatting, stairs, and transfers  PARTICIPATION LIMITATIONS: cleaning, laundry, driving, and community activity  PERSONAL FACTORS: 3+ comorbidities: see PMH above  are also affecting patient's functional outcome.   REHAB POTENTIAL: Good  CLINICAL DECISION MAKING: Stable/uncomplicated  EVALUATION COMPLEXITY: Moderate   GOALS: Goals reviewed with patient? Yes  SHORT TERM GOALS: (target date for Short term goals are 3 weeks 03/17/2024)   1.  Patient will demonstrate independent use of home exercise program to maintain progress from in clinic treatments.  Goal status: Met  LONG TERM GOALS: (target dates for all long term goals are 10 weeks  05/09/2024)   1. Patient will demonstrate/report pain at worst less than or equal to 2/10 to facilitate minimal limitation in daily activity secondary to pain symptoms.  Goal status: on going 03/20/2024   2. Patient will demonstrate independent use of home exercise program to facilitate ability to maintain/progress functional gains from skilled  physical therapy services.  Goal status: on going 03/20/2024   3. Patient will demonstrate Patient specific functional scale avg > or = 6.7 to indicate reduced disability due to condition.   Goal status: met  03/20/2024   4.  Patient will demonstrate bil  LE MMT >/= 4+/5 throughout to faciltiate usual transfers, stairs, squatting at [facility] for daily life.   Goal status: on going 03/20/2024   5.  Patient will demonstrate reciprocal gait pattern up and down 1 flight stairs with single hand rail  Goal status: on going 03/20/2024   6.  Pt will report being able to walk 1/4 a mile in </= 20 minutes with pain </= 3/10.  Goal status: on going 03/20/2024   7.  Pt will perform 5 time sit to stand in </= 13 seconds with no UE support.  Goal Status: on going 03/20/2024   PLAN:  PT FREQUENCY: 1-2x/week  PT DURATION: 10 weeks  PLANNED INTERVENTIONS: Can include 96045- PT Re-evaluation, 97110-Therapeutic exercises, 97530- Therapeutic activity, 97112- Neuromuscular re-education, 97535- Self Care, 97140- Manual therapy, [phone]- Gait training, [phone]- Orthotic Fit/training, [phone]- Canalith repositioning, [id number]- Aquatic Therapy, [phone]- Electrical stimulation (unattended), [phone]- Electrical stimulation (manual), [id number] Physical performance testing, 97016- Vasopneumatic device, [id number]- Ultrasound, [id number]- Traction (mechanical), [id number]- Ionotophoresis 4mg /ml Dexamethasone , Patient/Family education, Balance training, Stair training, Taping, Dry Needling, Joint mobilization, Joint manipulation, Spinal manipulation, Spinal mobilization, Scar mobilization, Vestibular training, Visual/preceptual remediation/compensation, DME instructions, Cryotherapy, and Moist heat.  All performed as medically necessary.  All included unless contraindicated  PLAN FOR NEXT SESSION: dynamometry check for knee extension possible.    Bonna Bustard, PT, DPT, OCS, ATC 03/27/24  3:03 PM

## 2024-03-31 ENCOUNTER — Ambulatory Visit (INDEPENDENT_AMBULATORY_CARE_PROVIDER_SITE_OTHER): Admitting: Physical Therapy

## 2024-03-31 ENCOUNTER — Encounter: Payer: Self-pay | Admitting: Physical Therapy

## 2024-03-31 DIAGNOSIS — M25551 Pain in right hip: Secondary | ICD-10-CM

## 2024-03-31 DIAGNOSIS — M6281 Muscle weakness (generalized): Secondary | ICD-10-CM | POA: Diagnosis not present

## 2024-03-31 DIAGNOSIS — M5459 Other low back pain: Secondary | ICD-10-CM

## 2024-03-31 DIAGNOSIS — R262 Difficulty in walking, not elsewhere classified: Secondary | ICD-10-CM

## 2024-03-31 DIAGNOSIS — M25562 Pain in left knee: Secondary | ICD-10-CM | POA: Diagnosis not present

## 2024-03-31 NOTE — Therapy (Signed)
 OUTPATIENT PHYSICAL THERAPY TREATMENT   Patient Name: Bobby [name] MRN: [id number] DOB:[birth year], 78 y.o., male Today's Date: 03/31/2024  END OF SESSION:  PT End of Session - 03/31/24 1510     Visit Number 8    Number of Visits 20    Date for PT Re-Evaluation 05/09/24    Authorization Type [employer]    Progress Note Due on Visit 10    PT Start Time 1430    PT Stop Time 1512    PT Time Calculation (min) 42 min    Activity Tolerance Patient tolerated treatment well    Behavior During Therapy WFL for tasks assessed/performed                     Past Medical History:  Diagnosis Date   Arthritis    Right wrist   BPH (benign prostatic hypertrophy)    COPD (chronic obstructive pulmonary disease) (HCC)    emphysema   Frequency of urination    History of lung cancer    STAGE 1A NON-SMALL CELL LUNG CARCINOMA ---  04-10-2009   S/P RIGHT UPPER LOBECTOMY , NO CHEMORADIATION--  NO RECURRENCE  (ONCOLOGIST-- DR Marguerita Shih)   Hyperlipidemia    Hypertension    nscl ca dx'd 04/2009   lung   Pulmonary nodule seen on imaging study    STABLE LEFT LOWER LOBE NODULE  PER CHEST CT--  MONITORED BY DR [facility]   Sleep apnea    OSA does not use CPAP   Type 2 diabetes mellitus (HCC)    Urgency of urination    Past Surgical History:  Procedure Laterality Date   CARDIOVASCULAR STRESS TEST  04-08-2009   NORMAL NUCLEAR STUDY/ NO ISCHEMIA/ EF 60%   CARPAL TUNNEL RELEASE Right    COLONOSCOPY  last 2019   CYST EXCISION Left 10/03/2018   Procedure: EXCISION OF LEFT AXILLARY SEBACEOUS CYST;  Surgeon: Oza Blumenthal, MD;  Location: [facility];  Service: General;  Laterality: Left;   excision of abcess     INTERCOSTAL NERVE BLOCK Left 12/16/2020   Procedure: INTERCOSTAL NERVE BLOCK;  Surgeon: Zelphia Higashi, MD;  Location: [facility] OR;  Service: Thoracic;  Laterality: Left;   KNEE ARTHROSCOPY W/ MENISCECTOMY Left 10-31-2004   LEFT SHOULDER ARTHROSCOPY/  ACROMIOPLASTY/ DECOMPRESSION/ SYNOVECTOMY  11-20-2007   LYMPH NODE DISSECTION Left 12/16/2020   Procedure: LYMPH NODE DISSECTION, left lung;  Surgeon: Zelphia Higashi, MD;  Location: [facility] OR;  Service: Thoracic;  Laterality: Left;   POLYPECTOMY     TRANSURETHRAL RESECTION OF PROSTATE N/A 11/03/2013   Procedure: TRANSURETHRAL RESECTION OF THE PROSTATE WITH GYRUS INSTRUMENTS;  Surgeon: Trent Frizzle, MD;  Location: [facility], [place];  Service: Urology;  Laterality: N/A;   TRIGGER FINGER RELEASE Right 2019   VIDEO ASSISTED THORACOSCOPY (VATS)/ LOBECTOMY Right 04-10-2009  DR Percy Bracken   RIGHT UPPER LOBECTOMY AND NODE DISSECTION   VIDEO BRONCHOSCOPY WITH ENDOBRONCHIAL NAVIGATION N/A 12/02/2020   Procedure: VIDEO BRONCHOSCOPY WITH ENDOBRONCHIAL NAVIGATION;  Surgeon: Zelphia Higashi, MD;  Location: [facility] OR;  Service: Thoracic;  Laterality: N/A;   XI ROBOTIC ASSISTED THORASCOPY-LEFT LOWER LOBECTOMY (Left  12/16/2020   Patient Active Problem List   Diagnosis Date Noted   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) 03/22/2021   S/P lobectomy of lung 12/16/2020   Scapholunate advanced collapse of right wrist 06/21/2017   Trigger finger, left middle finger 06/21/2017   Right hand pain 01/18/2017   Hypertrophy of prostate with  urinary obstruction and other lower urinary tract symptoms (LUTS) 11/03/2013   Cancer of upper lobe of right lung (HCC) 06/27/2012   OBESITY, UNSPECIFIED 04/08/2009   DIZZINESS 04/08/2009   DIABETES MELLITUS, TYPE II, CONTROLLED 04/07/2009   HYPERLIPIDEMIA 04/07/2009   Essential hypertension 04/07/2009    PCP: Merl Star, MD   REFERRING PROVIDER: Sandie Cross, PA-C   REFERRING DIAG:   Diagnosis  M25.551 (ICD-10-CM) - Pain in right hip    THERAPY DIAG:  Right hip pain  Other low back pain  Acute pain of left knee  Muscle weakness (generalized)  Difficulty in walking, not elsewhere classified  Rationale for Evaluation and  Treatment: Rehabilitation  ONSET DATE: Hip and back over 1 month, left knee off and on for a few months  SUBJECTIVE:   SUBJECTIVE STATEMENT: Pt arriving today reporting 2/10 left anterior knee.   PERTINENT HISTORY: COPD, h/o lung cancer, BPH, arthritis, HTN, DM2, Left knee arthroscopy c meniscectomy, Left shoulder arthroscopy 2008, left lower lobe lobectomy See PMH above   PAIN:  NPRS scale:  up to 5/10 Pain location: Lt knee Pain description: achy, soreness Aggravating factors: walking, standing, bending, driving Relieving factors: changing positions, resting  PRECAUTIONS: Other: no E-stim over lung field due to h/o cancer  WEIGHT BEARING RESTRICTIONS: No  FALLS:  Has patient fallen in last 6 months? No  LIVING ENVIRONMENT: Lives with: lives with their family and lives with their spouse Lives in: House/apartment Stairs: Yes: Internal: 15 steps; on right going up Has following equipment at home:  raised toilet  OCCUPATION: retired  PLOF: Independent  PATIENT GOALS: be able to lift my leg to drive without pain, walk without pain  Next MD visit:   OBJECTIVE:   DIAGNOSTIC FINDINGS: 02/06/24 X-rays demonstrate advanced multilevel degenerative changes worse L5-S1    PATIENT SURVEYS:  Patient-Specific Activity Scoring Scheme  "0" represents "unable to perform." "10" represents "able to perform at prior level. 0 1 2 3 4 5 6 7 8 9  10 (Date and Score)   Activity Eval  02/25/24  03/20/2024  1. Walking 5   9  2. driving 5   9  3. Climbing step 4 8  4.     5.    Score 14/3= 4.7 8.667   Total score = sum of the activity scores/number of activities Minimum detectable change (90%CI) for average score = 2 points Minimum detectable change (90%CI) for single activity score = 3 points  COGNITION: Overall cognitive status: WFL    SENSATION: WFL   POSTURE:  rounded shoulders, forward head, and decreased lumbar lordosis  PALPATION: TTP: lateral Rt hip and Rt  QL  LOWER EXTREMITY ROM:   ROM Right 02/25/24 Supine  active Left 02/25/24 Supine active Rt / Left 03/24/24 Supine active  Hip flexion 85 82 90 / 88  Hip extension     Hip abduction     Hip adduction     Hip internal rotation     Hip external rotation     Knee flexion 118 114   Knee extension 0 -6   Ankle dorsiflexion     Ankle plantarflexion     Ankle inversion     Ankle eversion      (Blank rows = not tested)  Lumbar ROM:  02/28/2024: Lumbar extension 25 % WFL with pain noted in Rt lumbar/posterior hip.  X 5 in standing produced similar pain each time.    02/25/2024 eval: Lumbar flexion: 60 degrees Lumbar extension: 20 degrees  Side Bending to Rt: 28 degrees Side bending left: 22 degrees  LOWER EXTREMITY MMT:  MMT Right eval Left eval Right 03/13/2024 Left 03/13/2024 Rt / Left 03/31/24 Sitting HHD  Hip flexion 4 4 5/5 5/5   Hip extension       Hip abduction 4 4     Hip adduction 4 4     Hip internal rotation       Hip external rotation       Knee flexion 5 4 5/5 5/5 45.7 / 35.6  Knee extension 5 4- 5/5 5/5 45.4 / 46.2  Ankle dorsiflexion       Ankle plantarflexion       Ankle inversion       Ankle eversion        (Blank rows = not tested)  LOWER EXTREMITY SPECIAL TESTS:  02/26/2024 Slump test: Rt: negative, Left negative  FUNCTIONAL TESTS:  03/27/2024: 5 x sit to stand: no UE 12.97 seconds  02/28/2024:  18 inch chair transfer s UE assist with fair control in lowering.   02/25/24: 5 times sit to stand: 16.11 seconds UE support  GAIT:  03/11/2024:  Deviation in stance on Lt leg c maintained knee flexion noted.   02/25/24 Distance walked: clinic distances Assistive device utilized: None Level of assistance: Complete Independence Comments: decreased heel to toe gait pattern, mild forward flexion                                                                                                                                                                        TODAY'S TREATMENT                                                                          DATE: 03/31/2024 Therex: Scifit bike: level 3 x 7 minutes Seated hamstring curls 2 x 10 bil LE green TB MMT performed on bil LE's Calf stretch on slant board: x 2 holding 30 sec c UE support NMR:  Tandem walking on level surface in parallel bars x 4 intermittent finger taps  Tandem walking on beam c single UE support x 4 in parallel bars Fitter First 2 point rocker board: bil UE support rocking front/back (df/pf) TherActivities:  Leg Press: double leg 87# 3 x 10 , single leg 43# 2 x 10 slow eccentrics avoiding TKE Step ups on 6 inch step x 15 bil LE c bil UE support Sit to stand: x 10 UE support from  mat at lowest position (18 inches)   TODAY'S TREATMENT                                                                          DATE: 03/27/2024 Therex: Nustep Lvl 6 UE/LE 12 mins  Incline gastroc stretch 30 sec x 3 bilateral   Neuro Re-ed (balance control, neuromuscular coordination/recruitment)  SLS with 3 cones anteriorly semicircle touching with contralateral leg x 8 each, performed bilaterally with occasional HHA and SBA to min A at times to prevent loss of balance.  Tandem stance 1 min x 2 bilateral with occasional to moderate HHA on bar with SBA to min A at times to prevent loss of balance.    TherActivity(to improve squatting, transfers, stair navigation) Leg press double leg  87 lbs x 20  , single leg  2 x 15 37 lbs,  performed bilaterally - performed without knee pain and in range available without pain in flexion.  5 x sit to stand from 18 inch chair no UE  TODAY'S TREATMENT                                                                          DATE: 03/24/2024 Therex: Nustep Lvl 6 UE/LE 10 mins  Incline gastroc stretch 30 sec x 5 bilateral  Standing calf rasies  c UE support Seated hamstring stretch; x 2 holding 30 sec Seated SLR: x 10 bil LE  TherActivity(to improve  squatting, transfers, stair navigation) Leg press double leg  100 lbs x 20  , single leg  x 15  43 lbs,  performed bilaterally - performed in pain free ROM Step up forward 6 inch step with single UE support x 15   Manual: Percussive device STM to Rt lower lumbar, superior glute max, glute med/min on Rt side   TODAY'S TREATMENT                                                                          DATE: 03/20/2024 Manual: Percussive device STM to Rt lower lumbar, superior glute max, glute med/min on Rt side  Therex: Nustep Lvl 6 UE/LE 10 mins  SKC 15 sec x 3 bilateral  Supine bridge 2-3 sec hold 2 x 10  Incline gastroc stretch 30 sec x 5 bilateral    TherActivity(to improve squatting, transfers, stair navigation) Leg press double leg  87 lbs x 20  , single leg  2 x 15 37 lbs,  performed bilaterally - performed without knee pain and in range available without pain in flexion.  Step up forward 6 inch step with light hand rail assist x 5 each side  TODAY'S TREATMENT  DATE: 03/18/2024 Manual: Percussive device STM to Rt lower lumbar, superior glute max, glute med/min on Rt side  Therex: Supine quad set 5 sec hold x 10 bilateral Supine lumbar trunk rotation stretch 15 sec x 3 bilateral  Seated quad set c SLR x 10 (additional time for cues for activity) Nustep lvl 6 10 min UE/LE Gastroc stretch incline 30 sec x 3   Additional time spent in education of techniques of intervention.   TODAY'S TREATMENT                                                                          DATE: 03/13/2024 Therex: Nustep Lvl 6 UE/LE 10 mins  Incline gastroc stretch 30 sec x 3 bilateral  Verbal review of at home leg strengthening activity  TherActivity(to improve squatting, transfers, stair navigation) Leg press double leg  87 lbs x 15  , single leg  2 x 15 37 lbs,  performed bilaterally - performed without knee pain and in range  available without pain in flexion.   Neuro Re-ed: (to improve neuro muscular recruitment, balance improvements for WB control) Tandem stance 1 min x 2 bilateral with occasional HHA on bar - time spent in instruction Standing Lt leg quad set TKE muscle recruitment green band 5 sec hold 2 x 10      PATIENT EDUCATION:  Education details: HEP, POC Person educated: Patient Education method: Programmer, multimedia, Demonstration, Verbal cues, and Handouts Education comprehension: verbalized understanding, returned demonstration, and verbal cues required  HOME EXERCISE PROGRAM: Access Code: [email or web address] URL: [email or web address] Date: 03/24/2024 Prepared by: Jerrel Mor  Exercises - Supine Bridge  - 2 x daily - 7 x weekly - 3 sets - 10 reps - 5 seconds hold - Supine Lower Trunk Rotation  - 2 x daily - 7 x weekly - 3 sets - 10 reps - Long Sitting Quad Set with Towel Roll Under Heel  - 2 x daily - 7 x weekly - 10 reps - 5 seconds hold - Heel Raises with Counter Support  - 2 x daily - 7 x weekly - 3 sets - 10 reps - Standing Gastroc Stretch  - 2 x daily - 7 x weekly - 3 sets - 10 reps - Seated Small Alternating Straight Leg Lifts with Heel Touch  - 2 x daily - 7 x weekly - 2 sets - 10 reps  ASSESSMENT:  CLINICAL IMPRESSION: Pt with weakness noted in left knee flexion/hamstrings. Pt tolerating exercises this session. Recommending continued skilled PT working toward goals set.     OBJECTIVE IMPAIRMENTS: decreased balance, difficulty walking, decreased ROM, decreased strength, and pain.   ACTIVITY LIMITATIONS: lifting, bending, sitting, standing, squatting, stairs, and transfers  PARTICIPATION LIMITATIONS: cleaning, laundry, driving, and community activity  PERSONAL FACTORS: 3+ comorbidities: see PMH above  are also affecting patient's functional outcome.   REHAB POTENTIAL: Good  CLINICAL DECISION MAKING: Stable/uncomplicated  EVALUATION COMPLEXITY: Moderate   GOALS: Goals  reviewed with patient? Yes  SHORT TERM GOALS: (target date for Short term goals are 3 weeks 03/17/2024)   1.  Patient will demonstrate independent use of home exercise program to maintain progress from in clinic treatments.  Goal status: Met  LONG TERM GOALS: (target dates for  all long term goals are 10 weeks  05/09/2024)   1. Patient will demonstrate/report pain at worst less than or equal to 2/10 to facilitate minimal limitation in daily activity secondary to pain symptoms.  Goal status: on going 03/20/2024   2. Patient will demonstrate independent use of home exercise program to facilitate ability to maintain/progress functional gains from skilled physical therapy services.  Goal status: on going 03/20/2024   3. Patient will demonstrate Patient specific functional scale avg > or = 6.7 to indicate reduced disability due to condition.   Goal status: met  03/20/2024   4.  Patient will demonstrate bil  LE MMT >/= 4+/5 throughout to faciltiate usual transfers, stairs, squatting at [facility] for daily life.   Goal status: on going 03/20/2024   5.  Patient will demonstrate reciprocal gait pattern up and down 1 flight stairs with single hand rail  Goal status: on going 03/20/2024   6.  Pt will report being able to walk 1/4 a mile in </= 20 minutes with pain </= 3/10.  Goal status: on going 03/20/2024   7.  Pt will perform 5 time sit to stand in </= 13 seconds with no UE support.  Goal Status: on going 03/20/2024   PLAN:  PT FREQUENCY: 1-2x/week  PT DURATION: 10 weeks  PLANNED INTERVENTIONS: Can include 96295- PT Re-evaluation, 97110-Therapeutic exercises, 97530- Therapeutic activity, 97112- Neuromuscular re-education, 97535- Self Care, 97140- Manual therapy, [phone]- Gait training, [phone]- Orthotic Fit/training, [phone]- Canalith repositioning, [id number]- Aquatic Therapy, [phone]- Electrical stimulation (unattended), [phone]- Electrical stimulation (manual), [id number] Physical performance testing, 97016-  Vasopneumatic device, [id number]- Ultrasound, [id number]- Traction (mechanical), [id number]- Ionotophoresis 4mg /ml Dexamethasone , Patient/Family education, Balance training, Stair training, Taping, Dry Needling, Joint mobilization, Joint manipulation, Spinal manipulation, Spinal mobilization, Scar mobilization, Vestibular training, Visual/preceptual remediation/compensation, DME instructions, Cryotherapy, and Moist heat.  All performed as medically necessary.  All included unless contraindicated  PLAN FOR NEXT SESSION: LE strengthening, hamstring strengthening on left, balance   Jerrel Mor, PT, MPT 03/31/24 3:20 PM   03/31/24  3:20 PM

## 2024-04-03 ENCOUNTER — Encounter: Admitting: Rehabilitative and Restorative Service Providers"

## 2024-04-03 ENCOUNTER — Telehealth: Payer: Self-pay | Admitting: Rehabilitative and Restorative Service Providers"

## 2024-04-03 NOTE — Telephone Encounter (Signed)
 Pt indicated forgot date of appointment.  Gave number for front desk for rescheduling.   Bonna Bustard, PT, DPT, OCS, ATC 04/03/24  2:52 PM

## 2024-04-11 ENCOUNTER — Ambulatory Visit (INDEPENDENT_AMBULATORY_CARE_PROVIDER_SITE_OTHER): Admitting: Rehabilitative and Restorative Service Providers"

## 2024-04-11 ENCOUNTER — Encounter: Payer: Self-pay | Admitting: Rehabilitative and Restorative Service Providers"

## 2024-04-11 DIAGNOSIS — M25551 Pain in right hip: Secondary | ICD-10-CM

## 2024-04-11 DIAGNOSIS — M6281 Muscle weakness (generalized): Secondary | ICD-10-CM | POA: Diagnosis not present

## 2024-04-11 DIAGNOSIS — R262 Difficulty in walking, not elsewhere classified: Secondary | ICD-10-CM

## 2024-04-11 DIAGNOSIS — M25562 Pain in left knee: Secondary | ICD-10-CM

## 2024-04-11 DIAGNOSIS — M5459 Other low back pain: Secondary | ICD-10-CM | POA: Diagnosis not present

## 2024-04-11 NOTE — Therapy (Addendum)
 OUTPATIENT PHYSICAL THERAPY TREATMENT / DISCHARGE   Patient Name: Bobby [name] MRN: [id number] DOB:[birth year], 78 y.o., male Today's Date: 04/11/2024  END OF SESSION:  PT End of Session - 04/11/24 1257     Visit Number 9    Number of Visits 20    Date for PT Re-Evaluation 05/09/24    Authorization Type [employer]    Progress Note Due on Visit 10    PT Start Time 1259    PT Stop Time 1338    PT Time Calculation (min) 39 min    Activity Tolerance Patient tolerated treatment well    Behavior During Therapy WFL for tasks assessed/performed                Past Medical History:  Diagnosis Date   Arthritis    Right wrist   BPH (benign prostatic hypertrophy)    COPD (chronic obstructive pulmonary disease) (HCC)    emphysema   Frequency of urination    History of lung cancer    STAGE 1A NON-SMALL CELL LUNG CARCINOMA ---  04-10-2009   S/P RIGHT UPPER LOBECTOMY , NO CHEMORADIATION--  NO RECURRENCE  (ONCOLOGIST-- DR SHERROD)   Hyperlipidemia    Hypertension    nscl ca dx'd 04/2009   lung   Pulmonary nodule seen on imaging study    STABLE LEFT LOWER LOBE NODULE  PER CHEST CT--  MONITORED BY DR [facility]   Sleep apnea    OSA does not use CPAP   Type 2 diabetes mellitus (HCC)    Urgency of urination    Past Surgical History:  Procedure Laterality Date   CARDIOVASCULAR STRESS TEST  04-08-2009   NORMAL NUCLEAR STUDY/ NO ISCHEMIA/ EF 60%   CARPAL TUNNEL RELEASE Right    COLONOSCOPY  last 2019   CYST EXCISION Left 10/03/2018   Procedure: EXCISION OF LEFT AXILLARY SEBACEOUS CYST;  Surgeon: Vernetta Berg, MD;  Location: [facility];  Service: General;  Laterality: Left;   excision of abcess     INTERCOSTAL NERVE BLOCK Left 12/16/2020   Procedure: INTERCOSTAL NERVE BLOCK;  Surgeon: Kerrin Elspeth BROCKS, MD;  Location: [facility];  Service: Thoracic;  Laterality: Left;   KNEE ARTHROSCOPY W/ MENISCECTOMY Left 10-31-2004   LEFT SHOULDER ARTHROSCOPY/  ACROMIOPLASTY/ DECOMPRESSION/ SYNOVECTOMY  11-20-2007   LYMPH NODE DISSECTION Left 12/16/2020   Procedure: LYMPH NODE DISSECTION, left lung;  Surgeon: Kerrin Elspeth BROCKS, MD;  Location: [facility] OR;  Service: Thoracic;  Laterality: Left;   POLYPECTOMY     TRANSURETHRAL RESECTION OF PROSTATE N/A 11/03/2013   Procedure: TRANSURETHRAL RESECTION OF THE PROSTATE WITH GYRUS INSTRUMENTS;  Surgeon: Garnette Shack, MD;  Location: [facility];  Service: Urology;  Laterality: N/A;   TRIGGER FINGER RELEASE Right 2019   VIDEO ASSISTED THORACOSCOPY (VATS)/ LOBECTOMY Right 04-10-2009  DR BRANTLEY   RIGHT UPPER LOBECTOMY AND NODE DISSECTION   VIDEO BRONCHOSCOPY WITH ENDOBRONCHIAL NAVIGATION N/A 12/02/2020   Procedure: VIDEO BRONCHOSCOPY WITH ENDOBRONCHIAL NAVIGATION;  Surgeon: Kerrin Elspeth BROCKS, MD;  Location: [facility] OR;  Service: Thoracic;  Laterality: N/A;   XI ROBOTIC ASSISTED THORASCOPY-LEFT LOWER LOBECTOMY (Left  12/16/2020   Patient Active Problem List   Diagnosis Date Noted   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) 03/22/2021   S/P lobectomy of lung 12/16/2020   Scapholunate advanced collapse of right wrist 06/21/2017   Trigger finger, left middle finger 06/21/2017   Right hand pain 01/18/2017   Hypertrophy of prostate with urinary obstruction and  other lower urinary tract symptoms (LUTS) 11/03/2013   Cancer of upper lobe of right lung (HCC) 06/27/2012   OBESITY, UNSPECIFIED 04/08/2009   DIZZINESS 04/08/2009   DIABETES MELLITUS, TYPE II, CONTROLLED 04/07/2009   HYPERLIPIDEMIA 04/07/2009   Essential hypertension 04/07/2009    PCP: Rexanne Ingle, MD   REFERRING PROVIDER: Jule Ronal CROME, PA-C   REFERRING DIAG:   Diagnosis  M25.551 (ICD-10-CM) - Pain in right hip    THERAPY DIAG:  Right hip pain  Other low back pain  Acute pain of left knee  Muscle weakness (generalized)  Difficulty in walking, not elsewhere classified  Rationale for Evaluation and  Treatment: Rehabilitation  ONSET DATE: Hip and back over 1 month, left knee off and on for a few months  SUBJECTIVE:   SUBJECTIVE STATEMENT: Pt indicated having some mild Rt hip today upon arrival.  Pt indicated overall improvement to normal around 85% at this time.  Pt indicated having good days with knee and some days the knee is more noticeable.   PERTINENT HISTORY: COPD, h/o lung cancer, BPH, arthritis, HTN, DM2, Left knee arthroscopy c meniscectomy, Left shoulder arthroscopy 2008, left lower lobe lobectomy See PMH above   PAIN:  NPRS scale:  a little. Pain location: Lt knee Pain description: achy, soreness Aggravating factors: walking, standing, bending, driving Relieving factors: changing positions, resting  PRECAUTIONS: Other: no E-stim over lung field due to h/o cancer  WEIGHT BEARING RESTRICTIONS: No  FALLS:  Has patient fallen in last 6 months? No  LIVING ENVIRONMENT: Lives with: lives with their family and lives with their spouse Lives in: House/apartment Stairs: Yes: Internal: 15 steps; on right going up Has following equipment at home: raised toilet  OCCUPATION: retired  PLOF: Independent  PATIENT GOALS: be able to lift my leg to drive without pain, walk without pain  Next MD visit:   OBJECTIVE:   DIAGNOSTIC FINDINGS: 02/06/24 X-rays demonstrate advanced multilevel degenerative changes worse L5-S1    PATIENT SURVEYS:  Patient-Specific Activity Scoring Scheme  0 represents "unable to perform." 10 represents "able to perform at prior level. 0 1 2 3 4 5 6 7 8 9  10 (Date and Score)   Activity Eval  02/25/24  03/20/2024  1. Walking 5   9  2. driving 5   9  3. Climbing step 4 8  4.     5.    Score 14/3= 4.7 8.667   Total score = sum of the activity scores/number of activities Minimum detectable change (90%CI) for average score = 2 points Minimum detectable change (90%CI) for single activity score = 3 points  COGNITION: Overall cognitive  status: WFL    SENSATION: WFL   POSTURE:  rounded shoulders, forward head, and decreased lumbar lordosis  PALPATION: TTP: lateral Rt hip and Rt QL  LOWER EXTREMITY ROM:   ROM Right 02/25/24 Supine  active Left 02/25/24 Supine active Rt / Left 03/24/24 Supine active  Hip flexion 85 82 90 / 88  Hip extension     Hip abduction     Hip adduction     Hip internal rotation     Hip external rotation     Knee flexion 118 114   Knee extension 0 -6   Ankle dorsiflexion     Ankle plantarflexion     Ankle inversion     Ankle eversion      (Blank rows = not tested)  Lumbar ROM:  5/92025:  Lumbar extension AROM: 100%  02/28/2024: Lumbar extension 25 %  WFL with pain noted in Rt lumbar/posterior hip.  X 5 in standing produced similar pain each time.    02/25/2024 eval: Lumbar flexion: 60 degrees Lumbar extension: 20 degrees Side Bending to Rt: 28 degrees Side bending left: 22 degrees  LOWER EXTREMITY MMT:  MMT Right eval Left eval Right 03/13/2024 Left 03/13/2024 Rt / Left 03/31/24 Sitting HHD  Hip flexion 4 4 5/5 5/5   Hip extension       Hip abduction 4 4     Hip adduction 4 4     Hip internal rotation       Hip external rotation       Knee flexion 5 4 5/5 5/5 45.7 / 35.6  Knee extension 5 4- 5/5 5/5 45.4 / 46.2  Ankle dorsiflexion       Ankle plantarflexion       Ankle inversion       Ankle eversion        (Blank rows = not tested)  LOWER EXTREMITY SPECIAL TESTS:  02/26/2024 Slump test: Rt: negative, Left negative  FUNCTIONAL TESTS:  03/27/2024: 5 x sit to stand: no UE 12.97 seconds  02/28/2024:  18 inch chair transfer s UE assist with fair control in lowering.   02/25/24: 5 times sit to stand: 16.11 seconds UE support  GAIT:  03/11/2024:  Deviation in stance on Lt leg c maintained knee flexion noted.   02/25/24 Distance walked: clinic distances Assistive device utilized: None Level of assistance: Complete Independence Comments: decreased heel to toe  gait pattern, mild forward flexion                                                                                                                                                                        TODAY'S TREATMENT                                                                          DATE: 04/11/2024 Therex: Nustep Lvl 6 UE/LE 12 mins  Lumbar extension AROM x 5  Incline gastroc stretch 30 sec x 3 bilateral  Machine knee extension double leg up , single leg lowering  10 lbs 2 x 10 bilaterally Additional time spent in education of activity for gym replication.  Emphasis on higher reps for fatigue vs higher weight.  Reviewed machine adjustment options for proper positioning.    TherActivity(to improve squatting, transfers, stair navigation) Leg press double leg  87 lbs x 20  ,  single leg  2 x 15 43 lbs,  performed bilaterally - performed without knee pain and in range available without pain in flexion.     TODAY'S TREATMENT                                                                          DATE: 03/31/2024 Therex: Scifit bike: level 3 x 7 minutes Seated hamstring curls 2 x 10 bil LE green TB MMT performed on bil LE's Calf stretch on slant board: x 2 holding 30 sec c UE support NMR:  Tandem walking on level surface in parallel bars x 4 intermittent finger taps  Tandem walking on beam c single UE support x 4 in parallel bars Fitter First 2 point rocker board: bil UE support rocking front/back (df/pf) TherActivities:  Leg Press: double leg 87# 3 x 10 , single leg 43# 2 x 10 slow eccentrics avoiding TKE Step ups on 6 inch step x 15 bil LE c bil UE support Sit to stand: x 10 UE support from mat at lowest position (18 inches)   TODAY'S TREATMENT                                                                          DATE: 03/27/2024 Therex: Nustep Lvl 6 UE/LE 12 mins  Incline gastroc stretch 30 sec x 3 bilateral   Neuro Re-ed (balance control, neuromuscular  coordination/recruitment)  SLS with 3 cones anteriorly semicircle touching with contralateral leg x 8 each, performed bilaterally with occasional HHA and SBA to min A at times to prevent loss of balance.  Tandem stance 1 min x 2 bilateral with occasional to moderate HHA on bar with SBA to min A at times to prevent loss of balance.    TherActivity(to improve squatting, transfers, stair navigation) Leg press double leg  87 lbs x 20  , single leg  2 x 15 37 lbs,  performed bilaterally - performed without knee pain and in range available without pain in flexion.  5 x sit to stand from 18 inch chair no UE  TODAY'S TREATMENT                                                                          DATE: 03/24/2024 Therex: Nustep Lvl 6 UE/LE 10 mins  Incline gastroc stretch 30 sec x 5 bilateral  Standing calf rasies  c UE support Seated hamstring stretch; x 2 holding 30 sec Seated SLR: x 10 bil LE  TherActivity(to improve squatting, transfers, stair navigation) Leg press double leg  100 lbs x 20  , single leg  x 15  43 lbs,  performed bilaterally - performed in pain free ROM  Step up forward 6 inch step with single UE support x 15   Manual: Percussive device STM to Rt lower lumbar, superior glute max, glute med/min on Rt side    PATIENT EDUCATION:  Education details: HEP, POC Person educated: Patient Education method: Programmer, multimedia, Demonstration, Verbal cues, and Handouts Education comprehension: verbalized understanding, returned demonstration, and verbal cues required  HOME EXERCISE PROGRAM: Access Code: [email or web address] URL: [email or web address] Date: 03/24/2024 Prepared by: Delon Lunger  Exercises - Supine Bridge  - 2 x daily - 7 x weekly - 3 sets - 10 reps - 5 seconds hold - Supine Lower Trunk Rotation  - 2 x daily - 7 x weekly - 3 sets - 10 reps - Long Sitting Quad Set with Towel Roll Under Heel  - 2 x daily - 7 x weekly - 10 reps - 5 seconds hold - Heel Raises with  Counter Support  - 2 x daily - 7 x weekly - 3 sets - 10 reps - Standing Gastroc Stretch  - 2 x daily - 7 x weekly - 3 sets - 10 reps - Seated Small Alternating Straight Leg Lifts with Heel Touch  - 2 x daily - 7 x weekly - 2 sets - 10 reps  ASSESSMENT:  CLINICAL IMPRESSION: Pt indicated overall good presentation since last visit.  Continued strengthening to help improve WB acceptance on knees.  Encouraged using gym access for continued strengthening from machines, stepper machine for benefits on own .     OBJECTIVE IMPAIRMENTS: decreased balance, difficulty walking, decreased ROM, decreased strength, and pain.   ACTIVITY LIMITATIONS: lifting, bending, sitting, standing, squatting, stairs, and transfers  PARTICIPATION LIMITATIONS: cleaning, laundry, driving, and community activity  PERSONAL FACTORS: 3+ comorbidities: see PMH above are also affecting patient's functional outcome.   REHAB POTENTIAL: Good  CLINICAL DECISION MAKING: Stable/uncomplicated  EVALUATION COMPLEXITY: Moderate   GOALS: Goals reviewed with patient? Yes  SHORT TERM GOALS: (target date for Short term goals are 3 weeks 03/17/2024)   1.  Patient will demonstrate independent use of home exercise program to maintain progress from in clinic treatments.  Goal status: Met  LONG TERM GOALS: (target dates for all long term goals are 10 weeks  05/09/2024)   1. Patient will demonstrate/report pain at worst less than or equal to 2/10 to facilitate minimal limitation in daily activity secondary to pain symptoms.  Goal status: on going 03/20/2024   2. Patient will demonstrate independent use of home exercise program to facilitate ability to maintain/progress functional gains from skilled physical therapy services.  Goal status: on going 03/20/2024   3. Patient will demonstrate Patient specific functional scale avg > or = 6.7 to indicate reduced disability due to condition.   Goal status: met  03/20/2024   4.  Patient  will demonstrate bil  LE MMT >/= 4+/5 throughout to faciltiate usual transfers, stairs, squatting at [facility] for daily life.   Goal status: on going 03/20/2024   5.  Patient will demonstrate reciprocal gait pattern up and down 1 flight stairs with single hand rail  Goal status: on going 03/20/2024   6.  Pt will report being able to walk 1/4 a mile in </= 20 minutes with pain </= 3/10.  Goal status: on going 03/20/2024   7.  Pt will perform 5 time sit to stand in </= 13 seconds with no UE support.  Goal Status: on going 03/20/2024   PLAN:  PT FREQUENCY: 1-2x/week  PT DURATION: 10 weeks  PLANNED INTERVENTIONS: Can include  02853- PT Re-evaluation, 97110-Therapeutic exercises, 97530- Therapeutic activity, [id number]- Neuromuscular re-education, [phone]- Self Care, 02859- Manual therapy, [phone]- Gait training, [phone]- Orthotic Fit/training, [phone]- Canalith repositioning, [id number]- Aquatic Therapy, [phone]- Electrical stimulation (unattended), [phone]- Electrical stimulation (manual), [id number] Physical performance testing, [id number]- Vasopneumatic device, [id number]- Ultrasound, [id number]- Traction (mechanical), [id number]- Ionotophoresis 4mg /ml Dexamethasone , Patient/Family education, Balance training, Stair training, Taping, Dry Needling, Joint mobilization, Joint manipulation, Spinal manipulation, Spinal mobilization, Scar mobilization, Vestibular training, Visual/preceptual remediation/compensation, DME instructions, Cryotherapy, and Moist heat.  All performed as medically necessary.  All included unless contraindicated  PLAN FOR NEXT SESSION: Reassess progress in 2 weeks, possible HEP trial?  Ozell Silvan, PT, DPT, OCS, ATC 04/11/24  1:31 PM   PHYSICAL THERAPY DISCHARGE SUMMARY  Visits from Start of Care: 9  Current functional level related to goals / functional outcomes: See note   Remaining deficits: See note   Education / Equipment: HEP  Patient goals were partially met. Patient is being discharged due to not  returning since the last visit.  Ozell Silvan, PT, DPT, OCS, ATC 07/18/24  10:47 AM

## 2024-04-24 ENCOUNTER — Encounter: Admitting: Rehabilitative and Restorative Service Providers"

## 2024-04-24 NOTE — Therapy (Incomplete)
 OUTPATIENT PHYSICAL THERAPY TREATMENT   Patient Name: Bobby [name] MRN: [id number] DOB:[birth year], 78 y.o., male Today's Date: 04/24/2024  END OF SESSION:       Past Medical History:  Diagnosis Date   Arthritis    Right wrist   BPH (benign prostatic hypertrophy)    COPD (chronic obstructive pulmonary disease) (HCC)    emphysema   Frequency of urination    History of lung cancer    STAGE 1A NON-SMALL CELL LUNG CARCINOMA ---  04-10-2009   S/P RIGHT UPPER LOBECTOMY , NO CHEMORADIATION--  NO RECURRENCE  (ONCOLOGIST-- DR Marguerita Shih)   Hyperlipidemia    Hypertension    nscl ca dx'd 04/2009   lung   Pulmonary nodule seen on imaging study    STABLE LEFT LOWER LOBE NODULE  PER CHEST CT--  MONITORED BY DR [facility]   Sleep apnea    OSA does not use CPAP   Type 2 diabetes mellitus (HCC)    Urgency of urination    Past Surgical History:  Procedure Laterality Date   CARDIOVASCULAR STRESS TEST  04-08-2009   NORMAL NUCLEAR STUDY/ NO ISCHEMIA/ EF 60%   CARPAL TUNNEL RELEASE Right    COLONOSCOPY  last 2019   CYST EXCISION Left 10/03/2018   Procedure: EXCISION OF LEFT AXILLARY SEBACEOUS CYST;  Surgeon: Oza Blumenthal, MD;  Location: [facility];  Service: General;  Laterality: Left;   excision of abcess     INTERCOSTAL NERVE BLOCK Left 12/16/2020   Procedure: INTERCOSTAL NERVE BLOCK;  Surgeon: Zelphia Higashi, MD;  Location: [facility] OR;  Service: Thoracic;  Laterality: Left;   KNEE ARTHROSCOPY W/ MENISCECTOMY Left 10-31-2004   LEFT SHOULDER ARTHROSCOPY/ ACROMIOPLASTY/ DECOMPRESSION/ SYNOVECTOMY  11-20-2007   LYMPH NODE DISSECTION Left 12/16/2020   Procedure: LYMPH NODE DISSECTION, left lung;  Surgeon: Zelphia Higashi, MD;  Location: [facility] OR;  Service: Thoracic;  Laterality: Left;   POLYPECTOMY     TRANSURETHRAL RESECTION OF PROSTATE N/A 11/03/2013   Procedure: TRANSURETHRAL RESECTION OF THE PROSTATE WITH GYRUS INSTRUMENTS;  Surgeon: Trent Frizzle, MD;   Location: [facility];  Service: Urology;  Laterality: N/A;   TRIGGER FINGER RELEASE Right 2019   VIDEO ASSISTED THORACOSCOPY (VATS)/ LOBECTOMY Right 04-10-2009  DR Percy Bracken   RIGHT UPPER LOBECTOMY AND NODE DISSECTION   VIDEO BRONCHOSCOPY WITH ENDOBRONCHIAL NAVIGATION N/A 12/02/2020   Procedure: VIDEO BRONCHOSCOPY WITH ENDOBRONCHIAL NAVIGATION;  Surgeon: Zelphia Higashi, MD;  Location: [facility] OR;  Service: Thoracic;  Laterality: N/A;   XI ROBOTIC ASSISTED THORASCOPY-LEFT LOWER LOBECTOMY (Left  12/16/2020   Patient Active Problem List   Diagnosis Date Noted   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) 03/22/2021   S/P lobectomy of lung 12/16/2020   Scapholunate advanced collapse of right wrist 06/21/2017   Trigger finger, left middle finger 06/21/2017   Right hand pain 01/18/2017   Hypertrophy of prostate with urinary obstruction and other lower urinary tract symptoms (LUTS) 11/03/2013   Cancer of upper lobe of right lung (HCC) 06/27/2012   OBESITY, UNSPECIFIED 04/08/2009   DIZZINESS 04/08/2009   DIABETES MELLITUS, TYPE II, CONTROLLED 04/07/2009   HYPERLIPIDEMIA 04/07/2009   Essential hypertension 04/07/2009    PCP: Merl Star, MD   REFERRING PROVIDER: Sandie Cross, PA-C   REFERRING DIAG:   Diagnosis  M25.551 (ICD-10-CM) - Pain in right hip    THERAPY DIAG:  No diagnosis found.  Rationale for Evaluation and Treatment: Rehabilitation  ONSET DATE: Hip and back over 1 month,  left knee off and on for a few months  SUBJECTIVE:   SUBJECTIVE STATEMENT: Pt indicated having some mild Rt hip today upon arrival.  Pt indicated overall improvement to normal around 85% at this time.  Pt indicated having good days with knee and some days the knee is more noticeable.   PERTINENT HISTORY: COPD, h/o lung cancer, BPH, arthritis, HTN, DM2, Left knee arthroscopy c meniscectomy, Left shoulder arthroscopy 2008, left lower lobe lobectomy See PMH  above   PAIN:  NPRS scale:  "a little." Pain location: Lt knee Pain description: achy, soreness Aggravating factors: walking, standing, bending, driving Relieving factors: changing positions, resting  PRECAUTIONS: Other: no E-stim over lung field due to h/o cancer  WEIGHT BEARING RESTRICTIONS: No  FALLS:  Has patient fallen in last 6 months? No  LIVING ENVIRONMENT: Lives with: lives with their family and lives with their spouse Lives in: House/apartment Stairs: Yes: Internal: 15 steps; on right going up Has following equipment at home: raised toilet  OCCUPATION: retired  PLOF: Independent  PATIENT GOALS: be able to lift my leg to drive without pain, walk without pain  Next MD visit:   OBJECTIVE:   DIAGNOSTIC FINDINGS: 02/06/24 X-rays demonstrate advanced multilevel degenerative changes worse L5-S1    PATIENT SURVEYS:  Patient-Specific Activity Scoring Scheme  "0" represents "unable to perform." "10" represents "able to perform at prior level. 0 1 2 3 4 5 6 7 8 9  10 (Date and Score)   Activity Eval  02/25/24  03/20/2024  1. Walking 5   9  2. driving 5   9  3. Climbing step 4 8  4.     5.    Score 14/3= 4.7 8.667   Total score = sum of the activity scores/number of activities Minimum detectable change (90%CI) for average score = 2 points Minimum detectable change (90%CI) for single activity score = 3 points  COGNITION: Overall cognitive status: WFL    SENSATION: WFL   POSTURE:  rounded shoulders, forward head, and decreased lumbar lordosis  PALPATION: TTP: lateral Rt hip and Rt QL  LOWER EXTREMITY ROM:   ROM Right 02/25/24 Supine  active Left 02/25/24 Supine active Rt / Left 03/24/24 Supine active  Hip flexion 85 82 90 / 88  Hip extension     Hip abduction     Hip adduction     Hip internal rotation     Hip external rotation     Knee flexion 118 114   Knee extension 0 -6   Ankle dorsiflexion     Ankle plantarflexion     Ankle  inversion     Ankle eversion      (Blank rows = not tested)  Lumbar ROM:  5/92025:  Lumbar extension AROM: 100%  02/28/2024: Lumbar extension 25 % WFL with pain noted in Rt lumbar/posterior hip.  X 5 in standing produced similar pain each time.    02/25/2024 eval: Lumbar flexion: 60 degrees Lumbar extension: 20 degrees Side Bending to Rt: 28 degrees Side bending left: 22 degrees  LOWER EXTREMITY MMT:  MMT Right eval Left eval Right 03/13/2024 Left 03/13/2024 Rt / Left 03/31/24 Sitting HHD  Hip flexion 4 4 5/5 5/5   Hip extension       Hip abduction 4 4     Hip adduction 4 4     Hip internal rotation       Hip external rotation       Knee flexion 5 4 5/5 5/5 45.7 /  35.6  Knee extension 5 4- 5/5 5/5 45.4 / 46.2  Ankle dorsiflexion       Ankle plantarflexion       Ankle inversion       Ankle eversion        (Blank rows = not tested)  LOWER EXTREMITY SPECIAL TESTS:  02/26/2024 Slump test: Rt: negative, Left negative  FUNCTIONAL TESTS:  03/27/2024: 5 x sit to stand: no UE 12.97 seconds  02/28/2024:  18 inch chair transfer s UE assist with fair control in lowering.   02/25/24: 5 times sit to stand: 16.11 seconds UE support  GAIT:  03/11/2024:  Deviation in stance on Lt leg c maintained knee flexion noted.   02/25/24 Distance walked: clinic distances Assistive device utilized: None Level of assistance: Complete Independence Comments: decreased heel to toe gait pattern, mild forward flexion                                                                                                                                                                        TODAY'S TREATMENT                                                                          DATE: 04/24/2024 Therex: Nustep Lvl 6 UE/LE 12 mins  Lumbar extension AROM x 5  Incline gastroc stretch 30 sec x 3 bilateral  Machine knee extension double leg up , single leg lowering  10 lbs 2 x 10 bilaterally Additional time  spent in education of activity for gym replication.  Emphasis on higher reps for fatigue vs higher weight.  Reviewed machine adjustment options for proper positioning.    TherActivity(to improve squatting, transfers, stair navigation) Leg press double leg  87 lbs x 20  , single leg  2 x 15 43 lbs,  performed bilaterally - performed without knee pain and in range available without pain in flexion.   TODAY'S TREATMENT                                                                          DATE: 04/11/2024 Therex: Nustep Lvl 6 UE/LE 12 mins  Lumbar extension AROM x 5  Incline gastroc stretch 30  sec x 3 bilateral  Machine knee extension double leg up , single leg lowering  10 lbs 2 x 10 bilaterally Additional time spent in education of activity for gym replication.  Emphasis on higher reps for fatigue vs higher weight.  Reviewed machine adjustment options for proper positioning.    TherActivity(to improve squatting, transfers, stair navigation) Leg press double leg  87 lbs x 20  , single leg  2 x 15 43 lbs,  performed bilaterally - performed without knee pain and in range available without pain in flexion.     TODAY'S TREATMENT                                                                          DATE: 03/31/2024 Therex: Scifit bike: level 3 x 7 minutes Seated hamstring curls 2 x 10 bil LE green TB MMT performed on bil LE's Calf stretch on slant board: x 2 holding 30 sec c UE support NMR:  Tandem walking on level surface in parallel bars x 4 intermittent finger taps  Tandem walking on beam c single UE support x 4 in parallel bars Fitter First 2 point rocker board: bil UE support rocking front/back (df/pf) TherActivities:  Leg Press: double leg 87# 3 x 10 , single leg 43# 2 x 10 slow eccentrics avoiding TKE Step ups on 6 inch step x 15 bil LE c bil UE support Sit to stand: x 10 UE support from mat at lowest position (18 inches)   TODAY'S TREATMENT                                                                           DATE: 03/27/2024 Therex: Nustep Lvl 6 UE/LE 12 mins  Incline gastroc stretch 30 sec x 3 bilateral   Neuro Re-ed (balance control, neuromuscular coordination/recruitment)  SLS with 3 cones anteriorly semicircle touching with contralateral leg x 8 each, performed bilaterally with occasional HHA and SBA to min A at times to prevent loss of balance.  Tandem stance 1 min x 2 bilateral with occasional to moderate HHA on bar with SBA to min A at times to prevent loss of balance.    TherActivity(to improve squatting, transfers, stair navigation) Leg press double leg  87 lbs x 20  , single leg  2 x 15 37 lbs,  performed bilaterally - performed without knee pain and in range available without pain in flexion.  5 x sit to stand from 18 inch chair no UE  TODAY'S TREATMENT                                                                          DATE: 03/24/2024 Therex: Nustep Lvl  6 UE/LE 10 mins  Incline gastroc stretch 30 sec x 5 bilateral  Standing calf rasies  c UE support Seated hamstring stretch; x 2 holding 30 sec Seated SLR: x 10 bil LE  TherActivity(to improve squatting, transfers, stair navigation) Leg press double leg  100 lbs x 20  , single leg  x 15  43 lbs,  performed bilaterally - performed in pain free ROM Step up forward 6 inch step with single UE support x 15   Manual: Percussive device STM to Rt lower lumbar, superior glute max, glute med/min on Rt side    PATIENT EDUCATION:  Education details: HEP, POC Person educated: Patient Education method: Programmer, multimedia, Demonstration, Verbal cues, and Handouts Education comprehension: verbalized understanding, returned demonstration, and verbal cues required  HOME EXERCISE PROGRAM: Access Code: [email or web address] URL: [email or web address] Date: 03/24/2024 Prepared by: Jerrel Mor  Exercises - Supine Bridge  - 2 x daily - 7 x weekly - 3 sets - 10 reps - 5 seconds hold - Supine Lower Trunk  Rotation  - 2 x daily - 7 x weekly - 3 sets - 10 reps - Long Sitting Quad Set with Towel Roll Under Heel  - 2 x daily - 7 x weekly - 10 reps - 5 seconds hold - Heel Raises with Counter Support  - 2 x daily - 7 x weekly - 3 sets - 10 reps - Standing Gastroc Stretch  - 2 x daily - 7 x weekly - 3 sets - 10 reps - Seated Small Alternating Straight Leg Lifts with Heel Touch  - 2 x daily - 7 x weekly - 2 sets - 10 reps  ASSESSMENT:  CLINICAL IMPRESSION: Pt indicated overall good presentation since last visit.  Continued strengthening to help improve WB acceptance on knees.  Encouraged using gym access for continued strengthening from machines, stepper machine for benefits on own .     OBJECTIVE IMPAIRMENTS: decreased balance, difficulty walking, decreased ROM, decreased strength, and pain.   ACTIVITY LIMITATIONS: lifting, bending, sitting, standing, squatting, stairs, and transfers  PARTICIPATION LIMITATIONS: cleaning, laundry, driving, and community activity  PERSONAL FACTORS: 3+ comorbidities: see PMH above are also affecting patient's functional outcome.   REHAB POTENTIAL: Good  CLINICAL DECISION MAKING: Stable/uncomplicated  EVALUATION COMPLEXITY: Moderate   GOALS: Goals reviewed with patient? Yes  SHORT TERM GOALS: (target date for Short term goals are 3 weeks 03/17/2024)   1.  Patient will demonstrate independent use of home exercise program to maintain progress from in clinic treatments.  Goal status: Met  LONG TERM GOALS: (target dates for all long term goals are 10 weeks  05/09/2024)   1. Patient will demonstrate/report pain at worst less than or equal to 2/10 to facilitate minimal limitation in daily activity secondary to pain symptoms.  Goal status: on going 03/20/2024   2. Patient will demonstrate independent use of home exercise program to facilitate ability to maintain/progress functional gains from skilled physical therapy services.  Goal status: on going 03/20/2024    3. Patient will demonstrate Patient specific functional scale avg > or = 6.7 to indicate reduced disability due to condition.   Goal status: met  03/20/2024   4.  Patient will demonstrate bil  LE MMT >/= 4+/5 throughout to faciltiate usual transfers, stairs, squatting at [facility] for daily life.   Goal status: on going 03/20/2024   5.  Patient will demonstrate reciprocal gait pattern up and down 1 flight stairs with single hand rail  Goal status: on going 03/20/2024  6.  Pt will report being able to walk 1/4 a mile in </= 20 minutes with pain </= 3/10.  Goal status: on going 03/20/2024   7.  Pt will perform 5 time sit to stand in </= 13 seconds with no UE support.  Goal Status: on going 03/20/2024   PLAN:  PT FREQUENCY: 1-2x/week  PT DURATION: 10 weeks  PLANNED INTERVENTIONS: Can include 19147- PT Re-evaluation, 97110-Therapeutic exercises, 97530- Therapeutic activity, 97112- Neuromuscular re-education, 97535- Self Care, 97140- Manual therapy, [phone]- Gait training, [phone]- Orthotic Fit/training, [phone]- Canalith repositioning, [id number]- Aquatic Therapy, [phone]- Electrical stimulation (unattended), [phone]- Electrical stimulation (manual), [id number] Physical performance testing, 97016- Vasopneumatic device, [id number]- Ultrasound, [id number]- Traction (mechanical), [id number]- Ionotophoresis 4mg /ml Dexamethasone , Patient/Family education, Balance training, Stair training, Taping, Dry Needling, Joint mobilization, Joint manipulation, Spinal manipulation, Spinal mobilization, Scar mobilization, Vestibular training, Visual/preceptual remediation/compensation, DME instructions, Cryotherapy, and Moist heat.  All performed as medically necessary.  All included unless contraindicated  PLAN FOR NEXT SESSION: Reassess progress in 2 weeks, possible HEP trial?  Bonna Bustard, PT, DPT, OCS, ATC 04/24/24  11:49 AM

## 2024-06-24 ENCOUNTER — Encounter: Payer: Self-pay | Admitting: Podiatry

## 2024-06-24 ENCOUNTER — Ambulatory Visit (INDEPENDENT_AMBULATORY_CARE_PROVIDER_SITE_OTHER): Admitting: Podiatry

## 2024-06-24 DIAGNOSIS — M79675 Pain in left toe(s): Secondary | ICD-10-CM

## 2024-06-24 DIAGNOSIS — B351 Tinea unguium: Secondary | ICD-10-CM

## 2024-06-24 DIAGNOSIS — M79674 Pain in right toe(s): Secondary | ICD-10-CM

## 2024-06-24 DIAGNOSIS — E1142 Type 2 diabetes mellitus with diabetic polyneuropathy: Secondary | ICD-10-CM | POA: Diagnosis not present

## 2024-06-24 NOTE — Patient Instructions (Signed)
Purchase Nervive Pain Cream or Roll On. Apply to feet before bedtime. Can be purchased at your local drug store over the counter.  

## 2024-06-29 ENCOUNTER — Encounter: Payer: Self-pay | Admitting: Podiatry

## 2024-06-29 NOTE — Progress Notes (Signed)
  Subjective:  Patient ID: Bobby [name], male    DOB: 22-Apr-1946,  MRN: [id number]  Bobby [name] presents to clinic today for preventative diabetic foot care and painful thick toenails that are difficult to trim. Pain interferes with ambulation. Aggravating factors include wearing enclosed shoe gear. Pain is relieved with periodic professional debridement. Patient states he has nerve pain in his feet described as numbness and burning. Chief Complaint  Patient presents with   Diabetes    DFC NIDDM A1C 6.2. Toenail trim with PCP 04/2024    PCP is Rexanne Ingle, MD.  No Known Allergies  Review of Systems: Negative except as noted in the HPI.  Objective: No changes noted in today's physical examination. There were no vitals filed for this visit. Bobby [name] is a pleasant 78 y.o. male in NAD. AAO x 3.  Vascular Examination: Capillary refill time immediate b/l. Vascular status intact b/l with palpable pedal pulses. Pedal hair present b/l. No pain with calf compression b/l. Skin temperature gradient WNL b/l. No cyanosis or clubbing b/l. No ischemia or gangrene noted b/l.   Neurological Examination: Sensation grossly intact b/l with 10 gram monofilament. Vibratory sensation intact b/l. Pt has subjective symptoms of neuropathy.  Dermatological Examination: Pedal skin with normal turgor, texture and tone b/l.  No open wounds. No interdigital macerations.   Toenails 1-5 b/l thick, discolored, elongated with subungual debris and pain on dorsal palpation.   No hyperkeratotic nor porokeratotic lesions.  Musculoskeletal Examination: Muscle strength 5/5 to all lower extremity muscle groups bilaterally. Pes planus deformity noted bilateral LE.  Radiographs: None  Assessment/Plan: 1. Pain due to onychomycosis of toenails of both feet   2. Diabetic peripheral neuropathy associated with type 2 diabetes mellitus (HCC)    -Patient was evaluated today. All questions/concerns  addressed on today's visit. -Discussed neuropathy symptoms. Advised patient/POA to purchase Nervive Pain Cream or Roll On. Apply to foot/feet before bedtime. -Continue foot and shoe inspections daily. Monitor blood glucose per PCP/Endocrinologist's recommendations. -Patient to continue soft, supportive shoe gear daily. -Toenails 1-5 b/l were debrided in length and girth with sterile nail nippers and dremel without iatrogenic bleeding.  -Patient/POA to call should there be question/concern in the interim.   Return in about 3 months (around 09/24/2024).  Delon LITTIE Merlin, DPM      [place] LOCATION: [address] [place] [address]                   Office [phone]   [facility] LOCATION: [address] [place], [place] [address] Office [phone]

## 2024-09-30 ENCOUNTER — Ambulatory Visit (HOSPITAL_COMMUNITY)
Admission: RE | Admit: 2024-09-30 | Discharge: 2024-09-30 | Disposition: A | Discharge status: Home / Self Care | Source: Ambulatory Visit | Attending: Internal Medicine | Admitting: Internal Medicine

## 2024-09-30 DIAGNOSIS — C3490 Malignant neoplasm of unspecified part of unspecified bronchus or lung: Secondary | ICD-10-CM | POA: Diagnosis present

## 2024-09-30 LAB — POCT I-STAT CREATININE: Creatinine, Ser: 1.1 mg/dL (ref 0.61–1.24)

## 2024-09-30 MED ORDER — SODIUM CHLORIDE (PF) 0.9 % IJ SOLN
INTRAMUSCULAR | Status: AC
  Filled 2024-09-30: qty 50

## 2024-09-30 MED ORDER — IOHEXOL 300 MG/ML  SOLN
75.0000 mL | Freq: Once | INTRAMUSCULAR | Status: AC | PRN
  Administered 2024-09-30: 75 mL via INTRAVENOUS

## 2024-10-06 ENCOUNTER — Encounter: Payer: Self-pay | Admitting: Radiology

## 2024-10-08 ENCOUNTER — Encounter: Payer: Self-pay | Admitting: Podiatry

## 2024-10-08 ENCOUNTER — Ambulatory Visit: Admitting: Podiatry

## 2024-10-08 DIAGNOSIS — B351 Tinea unguium: Secondary | ICD-10-CM | POA: Diagnosis not present

## 2024-10-08 DIAGNOSIS — M79674 Pain in right toe(s): Secondary | ICD-10-CM | POA: Diagnosis not present

## 2024-10-08 DIAGNOSIS — E1142 Type 2 diabetes mellitus with diabetic polyneuropathy: Secondary | ICD-10-CM

## 2024-10-08 DIAGNOSIS — M79675 Pain in left toe(s): Secondary | ICD-10-CM | POA: Diagnosis not present

## 2024-10-16 ENCOUNTER — Encounter: Payer: Self-pay | Admitting: Podiatry

## 2024-10-16 NOTE — Progress Notes (Signed)
  Subjective:  Patient ID: Bobby [name], male    DOB: 06-Jul-1946,  MRN: [id number]  HENSLEY TREAT presents to clinic today for preventative diabetic foot care for painful mycotic toenails x 10 which interfere with daily activities. Pain is relieved with periodic professional debridement.  Chief Complaint  Patient presents with   Diabetes    DFC IDDM A1C 7.5. Toenail trim.LO with PCP 09/25/24.   New problem(s): None.   PCP is Rexanne Ingle, MD.  No Known Allergies  Review of Systems: Negative except as noted in the HPI.  Objective: No changes noted in today's physical examination. There were no vitals filed for this visit. Bobby [name] is a pleasant 78 y.o. male in NAD. AAO x 3.  Vascular Examination: Capillary refill time immediate b/l. Vascular status intact b/l with palpable pedal pulses. Pedal hair present b/l. No pain with calf compression b/l. Skin temperature gradient WNL b/l. No cyanosis or clubbing b/l. No ischemia or gangrene noted b/l.   Neurological Examination: Sensation grossly intact b/l with 10 gram monofilament. Vibratory sensation intact b/l. Pt has subjective symptoms of neuropathy.  Dermatological Examination: Pedal skin with normal turgor, texture and tone b/l.  No open wounds. No interdigital macerations.   Toenails 1-5 b/l thick, discolored, elongated with subungual debris and pain on dorsal palpation.   No hyperkeratotic nor porokeratotic lesions.  Musculoskeletal Examination: Muscle strength 5/5 to all lower extremity muscle groups bilaterally. Pes planus deformity noted bilateral LE.  Radiographs: None  Assessment/Plan: 1. Pain due to onychomycosis of toenails of both feet   2. Diabetic peripheral neuropathy associated with type 2 diabetes mellitus [facility])   Consent given for treatment. Patient examined. All patient's and/or POA's questions/concerns addressed on today's visit. Mycotic toenails 1-5 b/l debrided in length and girth without  incident. Treatment was provided by assistant Andrez Manchester under my supervision. Continue foot and shoe inspections daily. Monitor blood glucose per PCP/Endocrinologist's recommendations.Continue soft, supportive shoe gear daily. Report any pedal injuries to medical professional. Call office if there are any quesitons/concerns.  Return in about 3 months (around 01/08/2025).  Delon LITTIE Merlin, DPM      [place] LOCATION: [address] [place] [address]                   Office [phone]   [facility] LOCATION: [address] [place], [place] [address] Office [phone]

## 2024-11-10 ENCOUNTER — Ambulatory Visit: Payer: 59 | Admitting: Internal Medicine

## 2024-11-10 ENCOUNTER — Inpatient Hospital Stay: Payer: 59 | Attending: Internal Medicine

## 2024-11-10 ENCOUNTER — Other Ambulatory Visit: Payer: Self-pay

## 2024-11-10 VITALS — BP 143/79 | HR 65 | Temp 97.8°F | Resp 17 | Ht 70.0 in | Wt 215.0 lb

## 2024-11-10 DIAGNOSIS — Z902 Acquired absence of lung [part of]: Secondary | ICD-10-CM | POA: Diagnosis not present

## 2024-11-10 DIAGNOSIS — Z08 Encounter for follow-up examination after completed treatment for malignant neoplasm: Secondary | ICD-10-CM | POA: Diagnosis present

## 2024-11-10 DIAGNOSIS — Z85118 Personal history of other malignant neoplasm of bronchus and lung: Secondary | ICD-10-CM | POA: Diagnosis present

## 2024-11-10 DIAGNOSIS — C3490 Malignant neoplasm of unspecified part of unspecified bronchus or lung: Secondary | ICD-10-CM

## 2024-11-10 LAB — CBC WITH DIFFERENTIAL (CANCER CENTER ONLY)
Abs Immature Granulocytes: 0.02 K/uL (ref 0.00–0.07)
Basophils Absolute: 0 K/uL (ref 0.0–0.1)
Basophils Relative: 1 %
Eosinophils Absolute: 0.1 K/uL (ref 0.0–0.5)
Eosinophils Relative: 1 %
HCT: 44.1 % (ref 39.0–52.0)
Hemoglobin: 14.2 g/dL (ref 13.0–17.0)
Immature Granulocytes: 0 %
Lymphocytes Relative: 24 %
Lymphs Abs: 1.8 K/uL (ref 0.7–4.0)
MCH: 26.2 pg (ref 26.0–34.0)
MCHC: 32.2 g/dL (ref 30.0–36.0)
MCV: 81.2 fL (ref 80.0–100.0)
Monocytes Absolute: 0.6 K/uL (ref 0.1–1.0)
Monocytes Relative: 7 %
Neutro Abs: 5.2 K/uL (ref 1.7–7.7)
Neutrophils Relative %: 67 %
Platelet Count: 274 K/uL (ref 150–400)
RBC: 5.43 MIL/uL (ref 4.22–5.81)
RDW: 15.7 % — ABNORMAL HIGH (ref 11.5–15.5)
WBC Count: 7.7 K/uL (ref 4.0–10.5)
nRBC: 0 % (ref 0.0–0.2)

## 2024-11-10 LAB — CMP (CANCER CENTER ONLY)
ALT: 19 U/L (ref 0–44)
AST: 23 U/L (ref 15–41)
Albumin: 4.1 g/dL (ref 3.5–5.0)
Alkaline Phosphatase: 65 U/L (ref 38–126)
Anion gap: 11 (ref 5–15)
BUN: 16 mg/dL (ref 8–23)
CO2: 23 mmol/L (ref 22–32)
Calcium: 9.3 mg/dL (ref 8.9–10.3)
Chloride: 107 mmol/L (ref 98–111)
Creatinine: 0.97 mg/dL (ref 0.61–1.24)
GFR, Estimated: >60 mL/min (ref >60)
Glucose, Bld: 107 mg/dL — ABNORMAL HIGH (ref 70–99)
Potassium: 4 mmol/L (ref 3.5–5.1)
Sodium: 141 mmol/L (ref 135–145)
Total Bilirubin: 0.3 mg/dL (ref 0.0–1.2)
Total Protein: 7.3 g/dL (ref 6.5–8.1)

## 2024-11-10 NOTE — Progress Notes (Signed)
 [facility] [phone]   [phone]  OFFICE PROGRESS NOTE  Rexanne Ingle, MD [address] [place] [address]  PRINCIPAL DIAGNOSIS: Stage IA (T1a N0 M0) non-small cell lung cancer, adenocarcinoma with bronchoalveolar features diagnosed in May 2010.   PRIOR THERAPY:  1) Status post right upper lobectomy on Apr 30, 2009 under the care of Dr. Brantley.  2) Robotic assisted left lower lobectomy and lymph node dissection to the 2 synchronous adenocarcinomas in the left lower lobe under the care of Dr. Kerrin.  Performed on 12/16/2020.  CURRENT THERAPY: Observation.  INTERVAL HISTORY: Bobby [name] 78 y.o. male returns to the clinic today for 47-month follow-up visit.Discussed the use of AI scribe software for clinical note transcription with the patient, who gave verbal consent to proceed.  History of Present Illness Bobby [name] is a 78 year old male with stage 1A non-small cell lung cancer who presents for evaluation with a repeat CT scan of the chest for restaging of his disease.  He has a history of stage 1A non-small cell lung cancer, initially diagnosed in May 2010, for which he underwent a right upper lobectomy. In January 2022, he experienced disease recurrence and underwent a left lower lobectomy and lymph node dissection with resection of two synchronous adenocarcinomas in the left lower lobe. Since then, he has been on observation.  He feels good with no complaints since his last visit, which was approximately a year ago. No chest pain, breathing issues, hemoptysis, recent weight loss, or night sweats. He mentions a weight loss of two pounds, which he attributes to being on Ozempic.  No headaches, changes in vision, or diplopia.    MEDICAL HISTORY: Past Medical History:  Diagnosis Date   Arthritis    Right wrist   BPH (benign prostatic hypertrophy)    COPD (chronic obstructive pulmonary disease) (HCC)     emphysema   Frequency of urination    History of lung cancer    STAGE 1A NON-SMALL CELL LUNG CARCINOMA ---  04-10-2009   S/P RIGHT UPPER LOBECTOMY , NO CHEMORADIATION--  NO RECURRENCE  (ONCOLOGIST-- DR SHERROD)   Hyperlipidemia    Hypertension    nscl ca dx'd 04/2009   lung   Pulmonary nodule seen on imaging study    STABLE LEFT LOWER LOBE NODULE  PER CHEST CT--  MONITORED BY DR [facility]   Sleep apnea    OSA does not use CPAP   Type 2 diabetes mellitus (HCC)    Urgency of urination     ALLERGIES:  has no known allergies.  MEDICATIONS:  Current Outpatient Medications  Medication Sig Dispense Refill   allopurinol  (ZYLOPRIM ) 100 MG tablet Take 100 mg by mouth daily.     amLODipine -benazepril  (LOTREL) 5-20 MG per capsule Take 1 capsule by mouth every morning.      Cholecalciferol (VITAMIN D3) 50 MCG (2000 UT) TABS Take 2,000 Units by mouth daily.     colchicine  0.6 MG tablet Take 0.6 mg by mouth daily.     fluticasone  (FLONASE ) 50 MCG/ACT nasal spray Place 1 spray into both nostrils daily as needed for allergies.     fluticasone  furoate-vilanterol (BREO ELLIPTA) 100-25 MCG/ACT AEPB Inhale 1 puff into the lungs daily.     metFORMIN  (GLUCOPHAGE ) 500 MG tablet Take 500 mg by mouth 2 (two) times daily.     methocarbamol  (ROBAXIN -750) 750 MG tablet Take 1 tablet (750 mg total) by mouth 2 (two)  times daily as needed for muscle spasms. (Patient not taking: Reported on 10/08/2024) 20 tablet 1   simvastatin  (ZOCOR ) 20 MG tablet Take 20 mg by mouth every evening.  1   No current facility-administered medications for this visit.    SURGICAL HISTORY:  Past Surgical History:  Procedure Laterality Date   CARDIOVASCULAR STRESS TEST  04-08-2009   NORMAL NUCLEAR STUDY/ NO ISCHEMIA/ EF 60%   CARPAL TUNNEL RELEASE Right    COLONOSCOPY  last 2019   CYST EXCISION Left 10/03/2018   Procedure: EXCISION OF LEFT AXILLARY SEBACEOUS CYST;  Surgeon: Vernetta Berg, MD;  Location: [facility];   Service: General;  Laterality: Left;   excision of abcess     INTERCOSTAL NERVE BLOCK Left 12/16/2020   Procedure: INTERCOSTAL NERVE BLOCK;  Surgeon: Kerrin Elspeth BROCKS, MD;  Location: [facility] OR;  Service: Thoracic;  Laterality: Left;   KNEE ARTHROSCOPY W/ MENISCECTOMY Left 10-31-2004   LEFT SHOULDER ARTHROSCOPY/ ACROMIOPLASTY/ DECOMPRESSION/ SYNOVECTOMY  11-20-2007   LYMPH NODE DISSECTION Left 12/16/2020   Procedure: LYMPH NODE DISSECTION, left lung;  Surgeon: Kerrin Elspeth BROCKS, MD;  Location: [facility] OR;  Service: Thoracic;  Laterality: Left;   POLYPECTOMY     TRANSURETHRAL RESECTION OF PROSTATE N/A 11/03/2013   Procedure: TRANSURETHRAL RESECTION OF THE PROSTATE WITH GYRUS INSTRUMENTS;  Surgeon: Garnette Shack, MD;  Location: [facility];  Service: Urology;  Laterality: N/A;   TRIGGER FINGER RELEASE Right 2019   VIDEO ASSISTED THORACOSCOPY (VATS)/ LOBECTOMY Right 04-10-2009  DR BRANTLEY   RIGHT UPPER LOBECTOMY AND NODE DISSECTION   VIDEO BRONCHOSCOPY WITH ENDOBRONCHIAL NAVIGATION N/A 12/02/2020   Procedure: VIDEO BRONCHOSCOPY WITH ENDOBRONCHIAL NAVIGATION;  Surgeon: Kerrin Elspeth BROCKS, MD;  Location: [facility] OR;  Service: Thoracic;  Laterality: N/A;   XI ROBOTIC ASSISTED THORASCOPY-LEFT LOWER LOBECTOMY (Left  12/16/2020    REVIEW OF SYSTEMS:  A comprehensive review of systems was negative.   PHYSICAL EXAMINATION: General appearance: alert, cooperative, and no distress Head: Normocephalic, without obvious abnormality, atraumatic Neck: no adenopathy, supple, symmetrical, trachea midline, and thyroid  not enlarged, symmetric, no tenderness/mass/nodules Lymph nodes: Cervical, supraclavicular, and axillary nodes normal. Resp: clear to auscultation bilaterally and normal percussion bilaterally Back: symmetric, no curvature. ROM normal. No CVA tenderness. Cardio: regular rate and rhythm, S1, S2 normal, no murmur, click, rub or gallop GI: soft, non-tender; bowel sounds normal; no  masses,  no organomegaly Extremities: extremities normal, atraumatic, no cyanosis or edema  ECOG PERFORMANCE STATUS: 0 - Asymptomatic  Pulse 65, temperature 97.8 F (36.6 C), temperature source Temporal, resp. rate 17, height 5' 10 (1.778 m), weight 215 lb (97.5 kg), SpO2 100%.  LABORATORY DATA: Lab Results  Component Value Date   WBC 7.7 11/10/2024   HGB 14.2 11/10/2024   HCT 44.1 11/10/2024   MCV 81.2 11/10/2024   PLT 274 11/10/2024      Chemistry      Component Value Date/Time   NA 139 10/08/2023 0904   NA 142 08/24/2015 1100   K 4.0 10/08/2023 0904   K 4.0 08/24/2015 1100   CL 107 10/08/2023 0904   CL 108 (H) 02/11/2013 0811   CO2 25 10/08/2023 0904   CO2 26 08/24/2015 1100   BUN 22 10/08/2023 0904   BUN 10.2 08/24/2015 1100   CREATININE 1.10 09/30/2024 1420   CREATININE 1.00 10/08/2023 0904   CREATININE 0.8 08/24/2015 1100      Component Value Date/Time   CALCIUM 9.3 10/08/2023 0904   CALCIUM 9.0 08/24/2015 1100  ALKPHOS 58 10/08/2023 0904   ALKPHOS 63 08/24/2015 1100   AST 30 10/08/2023 0904   AST 29 08/24/2015 1100   ALT 36 10/08/2023 0904   ALT 41 08/24/2015 1100   BILITOT 0.4 10/08/2023 0904   BILITOT 0.25 08/24/2015 1100       RADIOGRAPHIC STUDIES: No results found.   ASSESSMENT AND PLAN: This is a very pleasant 78 years old African American male with stage IA non-small cell lung cancer status post right upper lobectomy and has been observation since May of 2010 daily with no evidence for disease recurrence. He was found on recent CT scan of the chest to have enlarging subsolid central left lower lobe as well as groundglass opacity in the left lower lobe concerning for disease recurrence. The patient underwent Robotic assisted left lower lobectomy and lymph node dissection to the 2 synchronous adenocarcinomas in the left lower lobe under the care of Dr. Kerrin.  Performed on 12/16/2020. The patient is currently on observation. He had repeat CT  scan of the chest performed few weeks ago.  I personally independently reviewed the scan and discussed the result with the patient today.  His scan showed no concerning findings for disease recurrence or metastasis. Assessment and Plan Assessment & Plan Lung cancer status post right upper and left lower lobectomy, under surveillance Stage 1A non-small cell lung cancer, adenocarcinoma, diagnosed in May 2010. Status post right upper lobectomy and left lower lobectomy with lymph node dissection in January 2022. Currently under surveillance with no evidence of disease recurrence or progression on recent CT scan. No symptoms such as chest pain, dyspnea, hemoptysis, significant weight loss, or night sweats reported. Recent weight loss attributed to Ozempic use. - Continue annual surveillance with CT scan of the chest. The patient was advised to call immediately if he has any other concerning symptoms in the interval.  The patient voices understanding of current disease status and treatment options and is in agreement with the current care plan.  All questions were answered. The patient knows to call the clinic with any problems, questions or concerns. We can certainly see the patient much sooner if necessary.  Disclaimer: This note was dictated with voice recognition software. Similar sounding words can inadvertently be transcribed and may be missed upon review.

## 2024-11-11 ENCOUNTER — Telehealth: Payer: Self-pay | Admitting: Internal Medicine

## 2024-11-11 NOTE — Telephone Encounter (Signed)
 Scheduled patient for next appointments in a year. Called and left a voicemail with the details.

## 2024-12-10 ENCOUNTER — Other Ambulatory Visit: Payer: Self-pay | Admitting: Urology

## 2025-01-09 ENCOUNTER — Encounter (HOSPITAL_COMMUNITY): Payer: Self-pay | Admitting: Urology

## 2025-01-13 ENCOUNTER — Encounter (HOSPITAL_COMMUNITY): Admission: RE | Payer: Self-pay | Source: Home / Self Care

## 2025-01-13 ENCOUNTER — Ambulatory Visit (HOSPITAL_COMMUNITY): Admission: RE | Admit: 2025-01-13 | Source: Home / Self Care | Admitting: Urology

## 2025-01-13 SURGERY — CIRCUMCISION, ADULT
Anesthesia: General

## 2025-01-21 ENCOUNTER — Ambulatory Visit: Admitting: Podiatry

## 2025-11-02 ENCOUNTER — Inpatient Hospital Stay

## 2025-11-10 ENCOUNTER — Inpatient Hospital Stay: Admitting: Internal Medicine
